# Patient Record
Sex: Female | Born: 1950
Health system: Southern US, Community
[De-identification: ages and names within clinical notes are randomized; demographics above are authoritative.]

## PROBLEM LIST (undated history)

## (undated) DIAGNOSIS — R002 Palpitations: Secondary | ICD-10-CM

## (undated) DIAGNOSIS — C4491 Basal cell carcinoma of skin, unspecified: Secondary | ICD-10-CM

## (undated) DIAGNOSIS — M1990 Unspecified osteoarthritis, unspecified site: Secondary | ICD-10-CM

## (undated) DIAGNOSIS — I480 Paroxysmal atrial fibrillation: Secondary | ICD-10-CM

## (undated) DIAGNOSIS — R112 Nausea with vomiting, unspecified: Secondary | ICD-10-CM

## (undated) DIAGNOSIS — K219 Gastro-esophageal reflux disease without esophagitis: Secondary | ICD-10-CM

## (undated) DIAGNOSIS — Z9289 Personal history of other medical treatment: Secondary | ICD-10-CM

## (undated) DIAGNOSIS — Z7901 Long term (current) use of anticoagulants: Secondary | ICD-10-CM

## (undated) DIAGNOSIS — K3580 Unspecified acute appendicitis: Secondary | ICD-10-CM

## (undated) DIAGNOSIS — G43909 Migraine, unspecified, not intractable, without status migrainosus: Secondary | ICD-10-CM

## (undated) HISTORY — DX: Palpitations: R00.2

## (undated) HISTORY — PX: TUBAL LIGATION: SHX77

## (undated) HISTORY — DX: Unspecified acute appendicitis: K35.80

## (undated) HISTORY — PX: TONSILLECTOMY: SUR1361

## (undated) HISTORY — PX: BASAL CELL CARCINOMA EXCISION: SHX1214

## (undated) HISTORY — PX: FINGER FRACTURE SURGERY: SHX638

## (undated) HISTORY — PX: APPENDECTOMY: SHX54

## (undated) HISTORY — DX: Paroxysmal atrial fibrillation: I48.0

## (undated) HISTORY — PX: JOINT REPLACEMENT: SHX530

## (undated) HISTORY — PX: ACHILLES TENDON SURGERY: SHX542

## (undated) HISTORY — DX: Long term (current) use of anticoagulants: Z79.01

---

## 1987-09-25 HISTORY — PX: HIP FRACTURE SURGERY: SHX118

## 1989-11-24 HISTORY — PX: HIP SURGERY: SHX245

## 1990-11-24 HISTORY — PX: TOTAL HIP ARTHROPLASTY: SHX124

## 1995-11-25 HISTORY — PX: REVISION TOTAL HIP ARTHROPLASTY: SHX766

## 1999-10-08 ENCOUNTER — Other Ambulatory Visit: Admission: RE | Admit: 1999-10-08 | Discharge: 1999-10-08 | Payer: Self-pay | Admitting: Obstetrics and Gynecology

## 1999-10-10 ENCOUNTER — Other Ambulatory Visit: Admission: RE | Admit: 1999-10-10 | Discharge: 1999-10-10 | Payer: Self-pay | Admitting: Obstetrics and Gynecology

## 1999-10-10 ENCOUNTER — Encounter (INDEPENDENT_AMBULATORY_CARE_PROVIDER_SITE_OTHER): Payer: Self-pay

## 2000-10-12 ENCOUNTER — Other Ambulatory Visit: Admission: RE | Admit: 2000-10-12 | Discharge: 2000-10-12 | Payer: Self-pay | Admitting: Obstetrics and Gynecology

## 2001-07-17 ENCOUNTER — Emergency Department (HOSPITAL_COMMUNITY): Admission: EM | Admit: 2001-07-17 | Discharge: 2001-07-17 | Payer: Self-pay | Admitting: Internal Medicine

## 2001-07-17 ENCOUNTER — Encounter: Payer: Self-pay | Admitting: Internal Medicine

## 2001-08-19 ENCOUNTER — Encounter: Payer: Self-pay | Admitting: Family Medicine

## 2001-08-19 ENCOUNTER — Encounter: Admission: RE | Admit: 2001-08-19 | Discharge: 2001-08-19 | Payer: Self-pay | Admitting: Family Medicine

## 2002-02-03 ENCOUNTER — Other Ambulatory Visit: Admission: RE | Admit: 2002-02-03 | Discharge: 2002-02-03 | Payer: Self-pay | Admitting: Obstetrics and Gynecology

## 2004-03-13 ENCOUNTER — Encounter: Admission: RE | Admit: 2004-03-13 | Discharge: 2004-03-13 | Payer: Self-pay | Admitting: Family Medicine

## 2007-11-29 ENCOUNTER — Ambulatory Visit: Payer: Self-pay | Admitting: Gastroenterology

## 2007-12-13 ENCOUNTER — Ambulatory Visit: Payer: Self-pay | Admitting: Gastroenterology

## 2008-02-12 ENCOUNTER — Emergency Department (HOSPITAL_COMMUNITY): Admission: EM | Admit: 2008-02-12 | Discharge: 2008-02-12 | Payer: Self-pay | Admitting: Emergency Medicine

## 2008-03-27 ENCOUNTER — Telehealth: Payer: Self-pay | Admitting: Gastroenterology

## 2008-03-29 ENCOUNTER — Ambulatory Visit: Payer: Self-pay | Admitting: Gastroenterology

## 2008-04-03 ENCOUNTER — Telehealth: Payer: Self-pay | Admitting: Gastroenterology

## 2008-04-05 ENCOUNTER — Telehealth: Payer: Self-pay | Admitting: Gastroenterology

## 2008-04-05 ENCOUNTER — Encounter: Payer: Self-pay | Admitting: Gastroenterology

## 2008-04-05 ENCOUNTER — Ambulatory Visit (HOSPITAL_COMMUNITY): Admission: RE | Admit: 2008-04-05 | Discharge: 2008-04-05 | Payer: Self-pay | Admitting: Gastroenterology

## 2008-04-07 ENCOUNTER — Ambulatory Visit: Payer: Self-pay | Admitting: Gastroenterology

## 2008-04-11 ENCOUNTER — Ambulatory Visit: Payer: Self-pay | Admitting: Gastroenterology

## 2010-04-16 ENCOUNTER — Ambulatory Visit (HOSPITAL_COMMUNITY): Admission: RE | Admit: 2010-04-16 | Discharge: 2010-04-16 | Payer: Self-pay | Admitting: Obstetrics and Gynecology

## 2010-12-24 NOTE — Progress Notes (Signed)
Summary: DIARREAH  Medications Added DULCOLAX 5 MG TBEC (BISACODYL)  REGLAN 10 MG TABS (METOCLOPRAMIDE HCL)        Phone Note Call from Patient Call back at Home Phone [phone]   Caller: Spouse DAVID Call For: DR Arlyce Dice Reason for Call: Acute Illness Summary of Call: STILL HAVING DIARREAH & QUISINESS IN STOMACH WANTS TO GET APPT ASAP Initial call taken by: Leanor Kail [facility],  Apr 03, 2008 8:14 AM  Follow-up for Phone Call        PT'S SPOUSE HAS CALLED BACK TO FOLLOW UP & 1ST CALL HE MADE.  Magdalen Spatz [facility]  Apr 03, 2008 1:02 PM  Additional Follow-up for Phone Call Additional follow up Details #1::        Pt. continues with nausea-Zofran not helping. Taking immodium for diarrhea, that is somewhat improved. Now having pain at the bottom of her left rib cage x3-4 days, intermittent, becomming more severe. 1) continue immodium for diarrhea 2) continue Zofran as needed 3) Soft,bland diet. No spicy,greasy,fried, foods. 4) tylenol/Ibuprofen as needed 5) I will call pt., if new orders, after MD reviews. DR.Sharyon Peitz--PLEASE ADVISE  (chart on your desk) Additional Follow-up by: Laureen Ochs LPN,  Apr 03, 2008 5:40 PM    Additional Follow-up for Phone Call Additional follow up Details #2::    Pt was supposed to stop her metoprolol.  Did she do it? If so and symptoms continue, schedule EGD for later this week Follow-up by: Louis Meckel MD,  Apr 03, 2008 8:35 PM  Additional Follow-up for Phone Call Additional follow up Details #3:: Details for Additional Follow-up Action Taken: Pt. stopped Metoprolol as directed, sx. continue. Scheduled for Endoscopy at [facility] 04-05-08  at 10:30am. All instructions given to pt. by phone. (Precert. sent to [facility].) Additional Follow-up by: Laureen Ochs LPN,  Apr 04, 2008 8:39 AM  New/Updated Medications: DULCOLAX 5 MG TBEC (BISACODYL)  REGLAN 10 MG TABS (METOCLOPRAMIDE HCL)

## 2010-12-24 NOTE — Assessment & Plan Note (Signed)
Medications Added ZOFRAN ODT 4 MG  TBDP (ONDANSETRON) 1 tab q8h as needed      Allergies Added: NKDA  History of Present Illness Visit Type: consult Primary MD: Juluis Rainier Chief Complaint: nausea History of Present Illness:   Mrs. Circle is a 60 year old white female complaining of nausea and diarrhea. Approximately 2 days after starting propranolol for palpitations and she developed severe nausea and diarrhea. She may have 4-5 loose stools a day. She denies fever myalgias or arthralgias. Propranolol was discontinued and she was started on metoprolol. Symptoms have continued to the present. She has lost 7 pounds over the past 12 days. She took Cipro for 3 days without improvement.        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 She drinks rarely    Review of Systems       Review of systems as per history of present illness. In addition, she complains of anxiety. Review of systems is otherwise negative.    Physical Exam  She is a slightly ill-appearing female Pulse 76 blood pressure 108/66 weight 131  Physical Exam: General:   WDWN HEENT:   anicteric.  No pharyngeal abnormalities Neck:   No masses, thyroidmegaly Nodes:   No cervical, axillary, inguinal adenopathy Chest:    Clear to auscultation Cardiac:   No murmurs, gallops, rubs Abdomen:   BS active.  No abd masses, tenderness, organomegably Rectal:   Deferred Extremities:   No cyanosis, clubbing, edema Skeletal:   No deformities  Neuro:   Alert, oriented x3.  No focal abnormalities      Impression & Recommendations:  Problem # 1:  DIARRHEA (ICD-787.91) Patient's symptoms are most likely  medicine-related. Symptoms clearly are temporally related to the use of beta blocker. Nausea with it is an unusual side effect of beta blockers though diarrhea is clearly described. I think it is much less likely that she has an enteric infection  Recommendations #1 hold metoprolol #2 Zofran p.r.n. #3 check stool studies which are in progress  Problem # 2:  NAUSEA ALONE (ICD-787.02) As per #1  Medications Added to Medication List This Visit: 1)  Zofran Odt 4 Mg Tbdp (Ondansetron) .Marland Kitchen.. 1 tab q8h as needed     Prescriptions: ZOFRAN ODT 4 MG  TBDP (ONDANSETRON) 1 tab q8h as needed  #20 x 1   Entered and Authorized by:   Louis Meckel MD   Signed by:   Louis Meckel MD on 03/29/2008   Method used:   Electronically sent to ...       [facility] [address]  [id number]       [address]       [facility]  [place], [place]  [address]       Ph: [phone] or [phone]       Fax: [phone]   RxID:   [id number]  ]

## 2010-12-24 NOTE — Procedures (Signed)
Summary: EGD   EGD  Procedure date:  04/05/2008  Findings:      Findings: Normal  Location: [facility]   Patient Name: Christina [name], Christina [name] MRN: [id number] Procedure Procedures: Panendoscopy (EGD) CPT: 43235.  Personnel: Endoscopist: Barbette Hair. Arlyce Dice, MD.  Indications Symptoms: Nausea. Diarrhea. Abdominal pain,  History  Current Medications: Patient, Patient is not currently taking Coumadin.  Comments: Patient history reviewed and updated, pre-procedure physical performed prior to initiation of sedation?yes Pre-Exam Physical: Performed Apr 05, 2008  Cardio-pulmonary exam, Cardio-pulmonary exam, HEENT exam, HEENT exam, Abdominal exam, Abdominal exam, Mental status exam WNL.  Comments: Patient history reviewed and updated, pre-procedure physical performed prior to initiation of sedation? Exam Exam Info: Maximum depth of insertion Duodenum, intended Duodenum. Vocal cords, Vocal cords visualized. Gastric retroflexion, Gastric retroflexion performed. ASA Classification: II. Tolerance: good.  Sedation Meds: Patient assessed and found to be appropriate for moderate (conscious) sedation. Robinul 0.2 given IV. Fentanyl 50 mcg. given IV. Versed 4 mg. given IV. Cetacaine Spray 2 sprays given aerosolized. Robinul 0. 2 given IV. Mylicon 1 cc  Monitoring: BP and pulse monitoring, BP and pulse monitoring done. Oximetry used., Oximetry used. Supplemental O2 given Supplemental O2 given at 2 Liters. at 2 Liters.  Findings - Normal: Proximal Esophagus to Duodenal 2nd Portion.   Assessment Normal examination.  Events  Unplanned Intervention: No unplanned interventions were required.  Unplanned Events: , There were no complications. Plans Medication(s): Other: florastor 1 QD, starting Apr 05, 2008   Patient Education: Patient given standard instructions for: a normal exam.  Disposition: After procedure patient sent to recovery. After recovery patient sent home.   Scheduling: Stool for C dificile toxin, around  Office Visit, to [employer]. Arlyce Dice, MD, around Apr 12, 2008.      Prescriptions: HYOSCYAMINE SULFATE CR 0.375 MG  TB12 (HYOSCYAMINE SULFATE) 1 tab two times a day for 5 days, then as needed  #25 x 2   Entered and Authorized by:   Louis Meckel MD   Signed by:   Louis Meckel MD on 04/05/2008   Method used:   Electronically [facility] to ...       [facility]  [address]  [id number]       [address]       [place], [place]  [address]       Ph: [phone] or [phone]       Fax: [phone]   RxID:   [id number]

## 2010-12-24 NOTE — Miscellaneous (Signed)
Summary: PHARMACY/ALLERGY  Clinical Lists Changes  Observations: Added new observation of NKA: T (03/29/2008 8:41)

## 2011-04-24 ENCOUNTER — Other Ambulatory Visit: Payer: Self-pay | Admitting: Obstetrics and Gynecology

## 2011-04-24 DIAGNOSIS — R928 Other abnormal and inconclusive findings on diagnostic imaging of breast: Secondary | ICD-10-CM

## 2011-04-30 ENCOUNTER — Ambulatory Visit
Admission: RE | Admit: 2011-04-30 | Discharge: 2011-04-30 | Disposition: A | Discharge status: Home / Self Care | Payer: 59 | Source: Ambulatory Visit | Attending: Obstetrics and Gynecology | Admitting: Obstetrics and Gynecology

## 2011-04-30 DIAGNOSIS — R928 Other abnormal and inconclusive findings on diagnostic imaging of breast: Secondary | ICD-10-CM

## 2011-08-18 LAB — DIFFERENTIAL
Basophils Absolute: 0.1
Basophils Relative: 1
Eosinophils Absolute: 0.1
Eosinophils Relative: 1
Lymphocytes Relative: 25
Lymphs Abs: 1.9
Monocytes Absolute: 0.4
Monocytes Relative: 6
Neutro Abs: 5.2
Neutrophils Relative %: 68

## 2011-08-18 LAB — POCT CARDIAC MARKERS
CKMB, poc: <1 — ABNORMAL LOW
Myoglobin, poc: 32.9
Operator id: 1211
Troponin i, poc: <0.05

## 2011-08-18 LAB — CBC
HCT: 38.2
Hemoglobin: 12.8
MCHC: 33.5
MCV: 89.5
Platelets: 300
RBC: 4.27
RDW: 12.9
WBC: 7.7

## 2011-08-18 LAB — URINALYSIS, ROUTINE W REFLEX MICROSCOPIC
Bilirubin Urine: NEGATIVE
Glucose, UA: NEGATIVE
Hgb urine dipstick: NEGATIVE
Ketones, ur: NEGATIVE
Nitrite: NEGATIVE
Protein, ur: NEGATIVE
Specific Gravity, Urine: 1.017
Urobilinogen, UA: 0.2
pH: 7.5

## 2011-08-18 LAB — COMPREHENSIVE METABOLIC PANEL
ALT: 20
AST: 20
Albumin: 3.8
Alkaline Phosphatase: 56
BUN: 14
CO2: 28
Calcium: 9.2
Chloride: 107
Creatinine, Ser: 0.86
GFR calc Af Amer: >60
GFR calc non Af Amer: >60
Glucose, Bld: 118 — ABNORMAL HIGH
Potassium: 4.6
Sodium: 142
Total Bilirubin: 0.6
Total Protein: 7.3

## 2011-08-20 LAB — CBC
HCT: 37.9
Hemoglobin: 12.9
MCHC: 34.2
MCV: 89.6
Platelets: 202
RBC: 4.23
RDW: 12.1
WBC: 5.9

## 2011-08-20 LAB — COMPREHENSIVE METABOLIC PANEL
ALT: 15
AST: 16
Albumin: 3.5
Alkaline Phosphatase: 45
BUN: 6
CO2: 29
Calcium: 9
Chloride: 110
Creatinine, Ser: 0.94
GFR calc Af Amer: >60
GFR calc non Af Amer: >60
Glucose, Bld: 97
Potassium: 4.1
Sodium: 145
Total Bilirubin: 0.8
Total Protein: 6.2

## 2011-08-20 LAB — DIFFERENTIAL
Basophils Absolute: 0.1
Basophils Relative: 2 — ABNORMAL HIGH
Eosinophils Absolute: 0.1
Eosinophils Relative: 1
Lymphocytes Relative: 28
Lymphs Abs: 1.7
Monocytes Absolute: 0.5
Monocytes Relative: 8
Neutro Abs: 3.5
Neutrophils Relative %: 61

## 2011-11-21 ENCOUNTER — Ambulatory Visit (INDEPENDENT_AMBULATORY_CARE_PROVIDER_SITE_OTHER): Payer: 59

## 2011-11-21 DIAGNOSIS — M79609 Pain in unspecified limb: Secondary | ICD-10-CM

## 2011-11-21 DIAGNOSIS — S90129A Contusion of unspecified lesser toe(s) without damage to nail, initial encounter: Secondary | ICD-10-CM

## 2011-12-11 ENCOUNTER — Ambulatory Visit (INDEPENDENT_AMBULATORY_CARE_PROVIDER_SITE_OTHER): Payer: 59

## 2011-12-11 DIAGNOSIS — M79609 Pain in unspecified limb: Secondary | ICD-10-CM

## 2012-05-07 ENCOUNTER — Emergency Department (HOSPITAL_COMMUNITY): Payer: 59 | Admitting: *Deleted

## 2012-05-07 ENCOUNTER — Ambulatory Visit (INDEPENDENT_AMBULATORY_CARE_PROVIDER_SITE_OTHER): Payer: 59 | Admitting: Family Medicine

## 2012-05-07 ENCOUNTER — Inpatient Hospital Stay (HOSPITAL_COMMUNITY)
Admission: EM | Admit: 2012-05-07 | Discharge: 2012-05-08 | DRG: 343 | Disposition: A | Discharge status: Home / Self Care | Payer: 59 | Attending: Emergency Medicine | Admitting: Emergency Medicine

## 2012-05-07 ENCOUNTER — Encounter (HOSPITAL_COMMUNITY): Admission: EM | Disposition: A | Discharge status: Home / Self Care | Payer: Self-pay | Source: Home / Self Care

## 2012-05-07 ENCOUNTER — Encounter (HOSPITAL_COMMUNITY): Payer: Self-pay

## 2012-05-07 ENCOUNTER — Encounter (HOSPITAL_COMMUNITY): Payer: Self-pay | Admitting: *Deleted

## 2012-05-07 ENCOUNTER — Ambulatory Visit
Admission: RE | Admit: 2012-05-07 | Discharge: 2012-05-07 | Disposition: A | Discharge status: Home / Self Care | Payer: 59 | Source: Ambulatory Visit | Attending: Family Medicine | Admitting: Family Medicine

## 2012-05-07 VITALS — BP 137/81 | HR 95 | Temp 99.0°F | Resp 17 | Ht 62.5 in | Wt 138.0 lb

## 2012-05-07 DIAGNOSIS — R1031 Right lower quadrant pain: Secondary | ICD-10-CM

## 2012-05-07 DIAGNOSIS — R109 Unspecified abdominal pain: Secondary | ICD-10-CM

## 2012-05-07 DIAGNOSIS — K3580 Unspecified acute appendicitis: Principal | ICD-10-CM | POA: Diagnosis present

## 2012-05-07 DIAGNOSIS — K37 Unspecified appendicitis: Secondary | ICD-10-CM

## 2012-05-07 DIAGNOSIS — Z7982 Long term (current) use of aspirin: Secondary | ICD-10-CM

## 2012-05-07 DIAGNOSIS — Z79899 Other long term (current) drug therapy: Secondary | ICD-10-CM

## 2012-05-07 HISTORY — DX: Nausea with vomiting, unspecified: R11.2

## 2012-05-07 HISTORY — PX: LAPAROSCOPIC APPENDECTOMY: SHX408

## 2012-05-07 LAB — POCT URINALYSIS DIPSTICK
Bilirubin, UA: NEGATIVE
Glucose, UA: NEGATIVE
Ketones, UA: NEGATIVE
Leukocytes, UA: NEGATIVE
Nitrite, UA: NEGATIVE
Protein, UA: NEGATIVE
Spec Grav, UA: 1.01
Urobilinogen, UA: 0.2
pH, UA: 6.5

## 2012-05-07 LAB — BASIC METABOLIC PANEL
BUN: 9 mg/dL (ref 6–23)
CO2: 27 mEq/L (ref 19–32)
Calcium: 9.6 mg/dL (ref 8.4–10.5)
Chloride: 99 mEq/L (ref 96–112)
Creatinine, Ser: 0.72 mg/dL (ref 0.50–1.10)
GFR calc Af Amer: >90 mL/min (ref >90)
GFR calc non Af Amer: 89 mL/min — ABNORMAL LOW (ref >90)
Glucose, Bld: 108 mg/dL — ABNORMAL HIGH (ref 70–99)
Potassium: 3.6 mEq/L (ref 3.5–5.1)
Sodium: 138 mEq/L (ref 135–145)

## 2012-05-07 LAB — CBC
HCT: 39.9 % (ref 36.0–46.0)
Hemoglobin: 13.6 g/dL (ref 12.0–15.0)
MCH: 30.4 pg (ref 26.0–34.0)
MCHC: 34.1 g/dL (ref 30.0–36.0)
MCV: 89.1 fL (ref 78.0–100.0)
Platelets: 265 10*3/uL (ref 150–400)
RBC: 4.48 MIL/uL (ref 3.87–5.11)
RDW: 13 % (ref 11.5–15.5)
WBC: 12.8 10*3/uL — ABNORMAL HIGH (ref 4.0–10.5)

## 2012-05-07 LAB — DIFFERENTIAL
Basophils Absolute: 0 10*3/uL (ref 0.0–0.1)
Basophils Relative: 0 % (ref 0–1)
Eosinophils Absolute: 0 10*3/uL (ref 0.0–0.7)
Eosinophils Relative: 0 % (ref 0–5)
Lymphocytes Relative: 16 % (ref 12–46)
Lymphs Abs: 2 10*3/uL (ref 0.7–4.0)
Monocytes Absolute: 0.7 10*3/uL (ref 0.1–1.0)
Monocytes Relative: 5 % (ref 3–12)
Neutro Abs: 10.1 10*3/uL — ABNORMAL HIGH (ref 1.7–7.7)
Neutrophils Relative %: 79 % — ABNORMAL HIGH (ref 43–77)

## 2012-05-07 LAB — POCT UA - MICROSCOPIC ONLY
Casts, Ur, LPF, POC: NEGATIVE
Crystals, Ur, HPF, POC: NEGATIVE
Mucus, UA: POSITIVE
Yeast, UA: NEGATIVE

## 2012-05-07 LAB — TYPE AND SCREEN
ABO/RH(D): B POS
Antibody Screen: NEGATIVE

## 2012-05-07 LAB — POCT CBC
Granulocyte percent: 75.1 %G (ref 37–80)
HCT, POC: 43.9 % (ref 37.7–47.9)
Hemoglobin: 13.9 g/dL (ref 12.2–16.2)
Lymph, poc: 2.8 (ref 0.6–3.4)
MCH, POC: 28.8 pg (ref 27–31.2)
MCHC: 31.7 g/dL — AB (ref 31.8–35.4)
MCV: 90.9 fL (ref 80–97)
MID (cbc): 0.6 (ref 0–0.9)
MPV: 9.9 fL (ref 0–99.8)
POC Granulocyte: 10.5 — AB (ref 2–6.9)
POC LYMPH PERCENT: 20.3 %L (ref 10–50)
POC MID %: 4.6 %M (ref 0–12)
Platelet Count, POC: 287 10*3/uL (ref 142–424)
RBC: 4.83 M/uL (ref 4.04–5.48)
RDW, POC: 13.9 %
WBC: 14 10*3/uL — AB (ref 4.6–10.2)

## 2012-05-07 LAB — POCT URINE PREGNANCY: Preg Test, Ur: NEGATIVE

## 2012-05-07 LAB — PROTIME-INR
INR: 1.05 (ref 0.00–1.49)
Prothrombin Time: 13.9 seconds (ref 11.6–15.2)

## 2012-05-07 LAB — APTT: aPTT: 35 seconds (ref 24–37)

## 2012-05-07 LAB — ABO/RH: ABO/RH(D): B POS

## 2012-05-07 SURGERY — APPENDECTOMY, LAPAROSCOPIC
Anesthesia: General | Site: Abdomen | Wound class: Clean Contaminated

## 2012-05-07 MED ORDER — HEPARIN SODIUM (PORCINE) 5000 UNIT/ML IJ SOLN
5000.0000 [IU] | Freq: Three times a day (TID) | INTRAMUSCULAR | Status: DC
  Administered 2012-05-07 – 2012-05-08 (×2): 5000 [IU] via SUBCUTANEOUS
  Filled 2012-05-07 (×5): qty 1

## 2012-05-07 MED ORDER — ONDANSETRON HCL 4 MG/2ML IJ SOLN
INTRAMUSCULAR | Status: DC | PRN
  Administered 2012-05-07: 4 mg via INTRAVENOUS

## 2012-05-07 MED ORDER — SODIUM CHLORIDE 0.9 % IV SOLN
3.0000 g | Freq: Four times a day (QID) | INTRAVENOUS | Status: AC
  Administered 2012-05-07: 3 g via INTRAVENOUS
  Filled 2012-05-07: qty 3

## 2012-05-07 MED ORDER — GLYCOPYRROLATE 0.2 MG/ML IJ SOLN
INTRAMUSCULAR | Status: DC | PRN
  Administered 2012-05-07: 0.6 mg via INTRAVENOUS

## 2012-05-07 MED ORDER — SUCCINYLCHOLINE CHLORIDE 20 MG/ML IJ SOLN
INTRAMUSCULAR | Status: DC | PRN
  Administered 2012-05-07 (×2): 100 mg via INTRAVENOUS

## 2012-05-07 MED ORDER — HYDROCODONE-ACETAMINOPHEN 5-325 MG PO TABS: 1.0000 | ORAL_TABLET | ORAL | Status: DC | PRN

## 2012-05-07 MED ORDER — MIDAZOLAM HCL 5 MG/5ML IJ SOLN
INTRAMUSCULAR | Status: DC | PRN
  Administered 2012-05-07: 2 mg via INTRAVENOUS

## 2012-05-07 MED ORDER — BUPIVACAINE-EPINEPHRINE PF 0.25-1:200000 % IJ SOLN
INTRAMUSCULAR | Status: AC
  Filled 2012-05-07: qty 30

## 2012-05-07 MED ORDER — ACETAMINOPHEN 10 MG/ML IV SOLN
INTRAVENOUS | Status: AC
  Filled 2012-05-07: qty 100

## 2012-05-07 MED ORDER — ROCURONIUM BROMIDE 100 MG/10ML IV SOLN
INTRAVENOUS | Status: DC | PRN
  Administered 2012-05-07: 20 mg via INTRAVENOUS

## 2012-05-07 MED ORDER — PROPOFOL 10 MG/ML IV BOLUS
INTRAVENOUS | Status: DC | PRN
  Administered 2012-05-07: 40 mg via INTRAVENOUS
  Administered 2012-05-07: 160 mg via INTRAVENOUS

## 2012-05-07 MED ORDER — HYDROMORPHONE HCL PF 1 MG/ML IJ SOLN: 0.5000 mg | INTRAMUSCULAR | Status: DC | PRN

## 2012-05-07 MED ORDER — IOHEXOL 300 MG/ML  SOLN
30.0000 mL | Freq: Once | INTRAMUSCULAR | Status: AC | PRN
  Administered 2012-05-07: 30 mL via ORAL

## 2012-05-07 MED ORDER — SODIUM CHLORIDE 0.9 % IV BOLUS (SEPSIS)
1000.0000 mL | Freq: Once | INTRAVENOUS | Status: AC
  Administered 2012-05-07: 1000 mL via INTRAVENOUS

## 2012-05-07 MED ORDER — FENTANYL CITRATE 0.05 MG/ML IJ SOLN
INTRAMUSCULAR | Status: DC | PRN
  Administered 2012-05-07: 50 ug via INTRAVENOUS
  Administered 2012-05-07: 100 ug via INTRAVENOUS
  Administered 2012-05-07 (×2): 50 ug via INTRAVENOUS

## 2012-05-07 MED ORDER — ACETAMINOPHEN 10 MG/ML IV SOLN
INTRAVENOUS | Status: DC | PRN
  Administered 2012-05-07: 1000 mg via INTRAVENOUS

## 2012-05-07 MED ORDER — LACTATED RINGERS IV SOLN
INTRAVENOUS | Status: DC | PRN
  Administered 2012-05-07: 20:00:00 via INTRAVENOUS

## 2012-05-07 MED ORDER — FENTANYL CITRATE 0.05 MG/ML IJ SOLN: 25.0000 ug | INTRAMUSCULAR | Status: DC | PRN

## 2012-05-07 MED ORDER — SODIUM CHLORIDE 0.9 % IV SOLN
3.0000 g | Freq: Once | INTRAVENOUS | Status: AC
  Administered 2012-05-07: 3 g via INTRAVENOUS
  Filled 2012-05-07: qty 3

## 2012-05-07 MED ORDER — ACETAMINOPHEN 10 MG/ML IV SOLN
1000.0000 mg | Freq: Four times a day (QID) | INTRAVENOUS | Status: DC
  Administered 2012-05-08: 1000 mg via INTRAVENOUS
  Filled 2012-05-07 (×4): qty 100

## 2012-05-07 MED ORDER — LACTATED RINGERS IV SOLN
INTRAVENOUS | Status: DC | PRN
  Administered 2012-05-07: 1000 mL

## 2012-05-07 MED ORDER — DEXAMETHASONE SODIUM PHOSPHATE 10 MG/ML IJ SOLN
INTRAMUSCULAR | Status: DC | PRN
  Administered 2012-05-07: 10 mg via INTRAVENOUS

## 2012-05-07 MED ORDER — NEOSTIGMINE METHYLSULFATE 1 MG/ML IJ SOLN
INTRAMUSCULAR | Status: DC | PRN
  Administered 2012-05-07: 4 mg via INTRAVENOUS

## 2012-05-07 MED ORDER — LACTATED RINGERS IV SOLN: INTRAVENOUS | Status: DC

## 2012-05-07 MED ORDER — SUMATRIPTAN SUCCINATE 100 MG PO TABS
100.0000 mg | ORAL_TABLET | ORAL | Status: DC | PRN
  Filled 2012-05-07: qty 1

## 2012-05-07 MED ORDER — LIDOCAINE HCL (CARDIAC) 20 MG/ML IV SOLN
INTRAVENOUS | Status: DC | PRN
  Administered 2012-05-07: 80 mg via INTRAVENOUS

## 2012-05-07 MED ORDER — PROMETHAZINE HCL 25 MG/ML IJ SOLN: 6.2500 mg | INTRAMUSCULAR | Status: DC | PRN

## 2012-05-07 MED ORDER — ONDANSETRON HCL 4 MG/2ML IJ SOLN
4.0000 mg | Freq: Once | INTRAMUSCULAR | Status: AC
  Administered 2012-05-07: 4 mg via INTRAVENOUS
  Filled 2012-05-07: qty 2

## 2012-05-07 MED ORDER — KCL IN DEXTROSE-NACL 20-5-0.45 MEQ/L-%-% IV SOLN
INTRAVENOUS | Status: DC
  Administered 2012-05-07: 23:00:00 via INTRAVENOUS
  Filled 2012-05-07 (×3): qty 1000

## 2012-05-07 MED ORDER — SODIUM CHLORIDE 0.9 % IV SOLN
Freq: Once | INTRAVENOUS | Status: AC
  Administered 2012-05-07: 20:00:00 via INTRAVENOUS

## 2012-05-07 MED ORDER — IOHEXOL 300 MG/ML  SOLN
100.0000 mL | Freq: Once | INTRAMUSCULAR | Status: AC | PRN
  Administered 2012-05-07: 100 mL via INTRAVENOUS

## 2012-05-07 MED ORDER — BUPIVACAINE-EPINEPHRINE 0.25% -1:200000 IJ SOLN
INTRAMUSCULAR | Status: DC | PRN
  Administered 2012-05-07: 14 mL

## 2012-05-07 MED ORDER — GABAPENTIN 300 MG PO CAPS
300.0000 mg | ORAL_CAPSULE | Freq: Three times a day (TID) | ORAL | Status: DC
  Administered 2012-05-07: 300 mg via ORAL
  Filled 2012-05-07 (×4): qty 1

## 2012-05-07 MED ORDER — MORPHINE SULFATE 4 MG/ML IJ SOLN
4.0000 mg | Freq: Once | INTRAMUSCULAR | Status: AC
  Administered 2012-05-07: 4 mg via INTRAVENOUS
  Filled 2012-05-07: qty 1

## 2012-05-07 SURGICAL SUPPLY — 36 items
APPLIER CLIP ROT 10 11.4 M/L (STAPLE)
BENZOIN TINCTURE PRP APPL 2/3 (GAUZE/BANDAGES/DRESSINGS) ×2 IMPLANT
CANISTER SUCTION 2500CC (MISCELLANEOUS) ×2 IMPLANT
CLIP APPLIE ROT 10 11.4 M/L (STAPLE) IMPLANT
CLOTH BEACON ORANGE TIMEOUT ST (SAFETY) ×2 IMPLANT
COVER SURGICAL LIGHT HANDLE (MISCELLANEOUS) ×2 IMPLANT
CUTTER FLEX LINEAR 45M (STAPLE) ×2 IMPLANT
DECANTER SPIKE VIAL GLASS SM (MISCELLANEOUS) IMPLANT
DRAPE LAPAROSCOPIC ABDOMINAL (DRAPES) ×2 IMPLANT
ELECT REM PT RETURN 9FT ADLT (ELECTROSURGICAL) ×2
ELECTRODE REM PT RTRN 9FT ADLT (ELECTROSURGICAL) ×1 IMPLANT
ENDOLOOP SUT PDS II  0 18 (SUTURE)
ENDOLOOP SUT PDS II 0 18 (SUTURE) IMPLANT
GLOVE BIOGEL M 8.0 STRL (GLOVE) ×2 IMPLANT
GLOVE BIOGEL PI IND STRL 7.0 (GLOVE) ×1 IMPLANT
GLOVE BIOGEL PI INDICATOR 7.0 (GLOVE) ×1
GOWN STRL NON-REIN LRG LVL3 (GOWN DISPOSABLE) ×2 IMPLANT
GOWN STRL REIN XL XLG (GOWN DISPOSABLE) ×4 IMPLANT
HAND ACTIVATED (MISCELLANEOUS) ×2 IMPLANT
KIT BASIN OR (CUSTOM PROCEDURE TRAY) ×2 IMPLANT
NS IRRIG 1000ML POUR BTL (IV SOLUTION) ×2 IMPLANT
PENCIL BUTTON HOLSTER BLD 10FT (ELECTRODE) ×2 IMPLANT
POUCH SPECIMEN RETRIEVAL 10MM (ENDOMECHANICALS) ×2 IMPLANT
RELOAD 45 VASCULAR/THIN (ENDOMECHANICALS) ×2 IMPLANT
RELOAD STAPLE TA45 3.5 REG BLU (ENDOMECHANICALS) IMPLANT
SET IRRIG TUBING LAPAROSCOPIC (IRRIGATION / IRRIGATOR) ×2 IMPLANT
SOLUTION ANTI FOG 6CC (MISCELLANEOUS) ×2 IMPLANT
STRIP CLOSURE SKIN 1/2X4 (GAUZE/BANDAGES/DRESSINGS) ×2 IMPLANT
SUT VIC AB 4-0 SH 18 (SUTURE) ×2 IMPLANT
SUT VICRYL 0 UR6 27IN ABS (SUTURE) ×2 IMPLANT
SYR 30ML LL (SYRINGE) ×2 IMPLANT
TRAY FOLEY CATH 14FRSI W/METER (CATHETERS) ×2 IMPLANT
TRAY LAP CHOLE (CUSTOM PROCEDURE TRAY) ×2 IMPLANT
TROCAR XCEL BLUNT TIP 100MML (ENDOMECHANICALS) ×2 IMPLANT
TROCAR XCEL NON-BLD 11X100MML (ENDOMECHANICALS) ×2 IMPLANT
TUBING INSUFFLATION 10FT LAP (TUBING) ×2 IMPLANT

## 2012-05-07 NOTE — Anesthesia Preprocedure Evaluation (Signed)
Anesthesia Evaluation  Patient identified by MRN, date of birth, ID band Patient awake    Reviewed: Allergy & Precautions, H&P , NPO status , Patient's Chart, lab work & pertinent test results, reviewed documented beta blocker date and time   History of Anesthesia Complications (+) PONV  Airway Mallampati: II TM Distance: >3 FB Neck ROM: full    Dental No notable dental hx. (+) Teeth Intact and Dental Advidsory Given   Pulmonary neg pulmonary ROS,  breath sounds clear to auscultation  Pulmonary exam normal       Cardiovascular Exercise Tolerance: Good negative cardio ROS  Rhythm:regular Rate:Normal     Neuro/Psych negative neurological ROS  negative psych ROS   GI/Hepatic negative GI ROS, Neg liver ROS,   Endo/Other  negative endocrine ROS  Renal/GU negative Renal ROS  negative genitourinary   Musculoskeletal   Abdominal   Peds  Hematology negative hematology ROS (+)   Anesthesia Other Findings   Reproductive/Obstetrics negative OB ROS                           Anesthesia Physical Anesthesia Plan  ASA: I and Emergent  Anesthesia Plan: General, General ETT and Rapid Sequence   Post-op Pain Management:    Induction:   Airway Management Planned:   Additional Equipment:   Intra-op Plan:   Post-operative Plan:   Informed Consent: I have reviewed the patients History and Physical, chart, labs and discussed the procedure including the risks, benefits and alternatives for the proposed anesthesia with the patient or authorized representative who has indicated his/her understanding and acceptance.   Dental Advisory Given  Plan Discussed with: CRNA  Anesthesia Plan Comments:         Anesthesia Quick Evaluation

## 2012-05-07 NOTE — Progress Notes (Signed)
Patient Name: Christina [name] Date of Birth: May 12, 1951 Medical Record Number: [id number] Gender: female Date of Encounter: 05/07/2012  History of Present Illness:  Christina [name] is a 61 y.o. very pleasant female patient who presents with the following:  Last night around 9pm she noted lower abdominal pain which settled into her RLQ. She has noted anorexia (she did eat a few bites this morning) and nausea, but no vomiting.  She had not noted a fever, but did have chills last night.    No urinary symptoms such as dysuria or hematuria.   She has noted loose stools but no diarrhea.    She had had a BTL and surgery on her hips for fractures- otherwise none.    She is generally quite healthy.   Last colonoscopy 3 or 4 years ago- ok per her report.    Patient Active Problem List  Diagnosis  . NAUSEA ALONE  . DIARRHEA   No past medical history on file. No past surgical history on file. History  Substance Use Topics  . Smoking status: Never Smoker   . Smokeless tobacco: Not on file  . Alcohol Use: Not on file   No family history on file. No Known Allergies  Medication list has been reviewed and updated.  Prior to Admission medications   Not on File    Review of Systems:  As per HPI- otherwise negative.   Physical Examination: Filed Vitals:   05/07/12 1103  BP: 137/81  Pulse: 95  Temp: 99 F (37.2 C)  Resp: 17   Filed Vitals:   05/07/12 1103  Height: 5' 2.5" (1.588 m)  Weight: 138 lb (62.596 kg)   Body mass index is 24.84 kg/(m^2). Ideal Body Weight: Weight in (lb) to have BMI = 25: 138.6   GEN: WDWN, NAD, Non-toxic, A & O x 3 HEENT: Atraumatic, Normocephalic. Neck supple. No masses, No LAD.  TM wnl, PEERL, oropharynx wnl Ears and Nose: No external deformity. CV: RRR, No M/G/R. No JVD. No thrill. No extra heart sounds. PULM: CTA B, no wheezes, crackles, rhonchi. No retractions. No resp. distress. No accessory muscle use. ABD: S, ND.  Normal BS.  No  HSM.  She has RLQ tenderness, positive pain with rebound and some guarding GU: normal externally and internally, no CMT.  Tenderness right adnexa.   EXTR: No c/c/e NEURO Normal gait.  PSYCH: Normally interactive. Conversant. Not depressed or anxious appearing.  Calm demeanor.   Results for orders placed in visit on 05/07/12  POCT CBC      Component Value Range   WBC 14.0 (*) 4.6 - 10.2 K/uL   Lymph, poc 2.8  0.6 - 3.4   POC LYMPH PERCENT 20.3  10 - 50 %L   MID (cbc) 0.6  0 - 0.9   POC MID % 4.6  0 - 12 %M   POC Granulocyte 10.5 (*) 2 - 6.9   Granulocyte percent 75.1  37 - 80 %G   RBC 4.83  4.04 - 5.48 M/uL   Hemoglobin 13.9  12.2 - 16.2 g/dL   HCT, POC 40.9  81.1 - 47.9 %   MCV 90.9  80 - 97 fL   MCH, POC 28.8  27 - 31.2 pg   MCHC 31.7 (*) 31.8 - 35.4 g/dL   RDW, POC 91.4     Platelet Count, POC 287  142 - 424 K/uL   MPV 9.9  0 - 99.8 fL  POCT URINALYSIS DIPSTICK  Component Value Range   Color, UA yellow     Clarity, UA clear     Glucose, UA neg     Bilirubin, UA neg     Ketones, UA neg     Spec Grav, UA 1.010     Blood, UA small     pH, UA 6.5     Protein, UA neg     Urobilinogen, UA 0.2     Nitrite, UA neg     Leukocytes, UA Negative    POCT UA - MICROSCOPIC ONLY      Component Value Range   WBC, Ur, HPF, POC 2-3     RBC, urine, microscopic 4-5     Bacteria, U Microscopic 2+     Mucus, UA pos     Epithelial cells, urine per micros 1-3     Crystals, Ur, HPF, POC neg     Casts, Ur, LPF, POC neg     Yeast, UA neg    POCT URINE PREGNANCY      Component Value Range   Preg Test, Ur Negative       Assessment and Plan: 1. Abdominal  pain, other specified site  POCT CBC, POCT urinalysis dipstick, POCT UA - Microscopic Only, POCT urine pregnancy, CT Abdomen Pelvis W Contrast, Urine culture   Suspect appendicitis.  Will proceed to [facility] for a CT abdomen and pelvis with contrast.    CT result did show appendicitis but no evidence of rupture.  Will  have her drive to [facility] with her husband.  Called and alerted [facility] ED staff. Appreciate care by ED and surgical staff of this very nice patient.   Abbe Amsterdam, MD

## 2012-05-07 NOTE — Transfer of Care (Signed)
Immediate Anesthesia Transfer of Care Note  Patient: Christina [name]  Procedure(s) Performed: Procedure(s) (LRB): APPENDECTOMY LAPAROSCOPIC (N/A)  Patient Location: PACU  Anesthesia Type: General  Level of Consciousness: awake, alert  and oriented  Airway & Oxygen Therapy: Patient Spontanous Breathing and Patient connected to face mask oxygen  Post-op Assessment: Report given to PACU RN and Post -op Vital signs reviewed and stable  Post vital signs: Reviewed and stable  Complications: No apparent anesthesia complications

## 2012-05-07 NOTE — ED Notes (Signed)
Patient reports that she was told by Dr. Dallas Schimke that she had appendicitis per the CT Scan. Patient was told to come to the ED.  Patient c/o nausea and right lower quadrant pain.

## 2012-05-07 NOTE — H&P (Signed)
Chief Complaint:  Right lower quadrant abdominal pain since last night  History of Present Illness:  Christina [name] is an 61 y.o.  white female who began having lower abdominal pain last night and was seen and referred by urgent care to the ER for a CT scan. CT scan showed findings consistent with appendicitis. Patient has point tenderness the right lower quadrant.  We discussed laparoscopic appendectomy in some detail. She is aware of the indications and risks. She's been otherwise healthy except for a right hip surgery. She's never had any abdominal surgery.  Past Medical History  Diagnosis Date  . PONV (postoperative nausea and vomiting)   . Dysrhythmia     saw Dr. Donnie Aho - no tx needed    Past Surgical History  Procedure Date  . Hip surgery     4 right hip surgeries  . Tubal ligation   . Achilles tendon surgery     left  . Finger surgery     left middle finger - pin placed    Current Facility-Administered Medications  Medication Dose Route Frequency Provider Last Rate Last Dose  . 0.9 %  sodium chloride infusion   Intravenous Once Nat Christen, MD      . Ampicillin-Sulbactam (UNASYN) 3 g in sodium chloride 0.9 % 100 mL IVPB  3 g Intravenous Once Nat Christen, MD   3 g at 05/07/12 1725  . morphine 4 MG/ML injection 4 mg  4 mg Intravenous Once Nat Christen, MD   4 mg at 05/07/12 1725  . ondansetron (ZOFRAN) injection 4 mg  4 mg Intravenous Once Nat Christen, MD   4 mg at 05/07/12 1725  . sodium chloride 0.9 % bolus 1,000 mL  1,000 mL Intravenous Once Nat Christen, MD   1,000 mL at 05/07/12 1725   Current Outpatient Prescriptions  Medication Sig Dispense Refill  . aspirin 81 MG chewable tablet Chew 81 mg by mouth daily.      Marland Kitchen gabapentin (NEURONTIN) 300 MG capsule Take 300 mg by mouth 3 (three) times daily.      . multivitamin-lutein (OCUVITE-LUTEIN) CAPS Take 1 capsule by mouth daily.      . SUMAtriptan (IMITREX) 100 MG tablet Take 100 mg by mouth  every 2 (two) hours as needed.      . vitamin C (ASCORBIC ACID) 500 MG tablet Take 500 mg by mouth daily.       Facility-Administered Medications Ordered in Other Encounters  Medication Dose Route Frequency Provider Last Rate Last Dose  . iohexol (OMNIPAQUE) 300 MG/ML solution 100 mL  100 mL Intravenous Once PRN Medication Radiologist, MD   100 mL at 05/07/12 1706  . iohexol (OMNIPAQUE) 300 MG/ML solution 30 mL  30 mL Oral Once PRN Medication Radiologist, MD   30 mL at 05/07/12 1706   Oxycontin Family History  Problem Relation Age of Onset  . Hypertension Mother   . Hypertension Sister   . Diabetes Sister    Social History:   reports that she has never smoked. She has never used smokeless tobacco. She reports that she drinks alcohol. She reports that she does not use illicit drugs.   REVIEW OF SYSTEMS - PERTINENT POSITIVES ONLY: Noncontributory  Physical Exam:   Blood pressure 123/62, pulse 106, temperature 99.1 F (37.3 C), temperature source Oral, resp. rate 21, SpO2 97.00%. There is no height or weight on file to calculate BMI.  Gen:  WDWN WHITE female NAD  Neurological: Alert and oriented  to person, place, and time. Motor and sensory function is grossly intact  Head: Normocephalic and atraumatic.  Eyes: Conjunctivae are normal. Pupils are equal, round, and reactive to light. No scleral icterus.  Neck: Normal range of motion. Neck supple. No tracheal deviation or thyromegaly present.  Cardiovascular:  SR without murmurs or gallops.  No carotid bruits Respiratory: Effort normal.  No respiratory distress. No chest wall tenderness. Breath sounds normal.  No wheezes, rales or rhonchi.  Abdomen:  Tender in right lower quadrant at McBurney's point. GU: Musculoskeletal: Normal range of motion. Extremities are nontender. No cyanosis, edema or clubbing noted Lymphadenopathy: No cervical, preauricular, postauricular or axillary adenopathy is present Skin: Skin is warm and dry. No rash  noted. No diaphoresis. No erythema. No pallor. Pscyh: Normal mood and affect. Behavior is normal. Judgment and thought content normal.   LABORATORY RESULTS: Results for orders placed during the hospital encounter of 05/07/12 (from the past 48 hour(s))  CBC     Status: Abnormal   Collection Time   05/07/12  5:40 PM      Component Value Range Comment   WBC 12.8 (*) 4.0 - 10.5 K/uL    RBC 4.48  3.87 - 5.11 MIL/uL    Hemoglobin 13.6  12.0 - 15.0 g/dL    HCT 16.1  09.6 - 04.5 %    MCV 89.1  78.0 - 100.0 fL    MCH 30.4  26.0 - 34.0 pg    MCHC 34.1  30.0 - 36.0 g/dL    RDW 40.9  81.1 - 91.4 %    Platelets 265  150 - 400 K/uL   DIFFERENTIAL     Status: Abnormal   Collection Time   05/07/12  5:40 PM      Component Value Range Comment   Neutrophils Relative 79 (*) 43 - 77 %    Neutro Abs 10.1 (*) 1.7 - 7.7 K/uL    Lymphocytes Relative 16  12 - 46 %    Lymphs Abs 2.0  0.7 - 4.0 K/uL    Monocytes Relative 5  3 - 12 %    Monocytes Absolute 0.7  0.1 - 1.0 K/uL    Eosinophils Relative 0  0 - 5 %    Eosinophils Absolute 0.0  0.0 - 0.7 K/uL    Basophils Relative 0  0 - 1 %    Basophils Absolute 0.0  0.0 - 0.1 K/uL   BASIC METABOLIC PANEL     Status: Abnormal   Collection Time   05/07/12  5:40 PM      Component Value Range Comment   Sodium 138  135 - 145 mEq/L    Potassium 3.6  3.5 - 5.1 mEq/L    Chloride 99  96 - 112 mEq/L    CO2 27  19 - 32 mEq/L    Glucose, Bld 108 (*) 70 - 99 mg/dL    BUN 9  6 - 23 mg/dL    Creatinine, Ser 7.82  0.50 - 1.10 mg/dL    Calcium 9.6  8.4 - 95.6 mg/dL    GFR calc non Af Amer 89 (*) >90 mL/min    GFR calc Af Amer >90  >90 mL/min   APTT     Status: Normal   Collection Time   05/07/12  5:40 PM      Component Value Range Comment   aPTT 35  24 - 37 seconds   PROTIME-INR     Status: Normal   Collection Time  05/07/12  5:40 PM      Component Value Range Comment   Prothrombin Time 13.9  11.6 - 15.2 seconds    INR 1.05  0.00 - 1.49   TYPE AND SCREEN      Status: Normal (Preliminary result)   Collection Time   05/07/12  5:40 PM      Component Value Range Comment   ABO/RH(D) B POS      Antibody Screen PENDING      Sample Expiration 05/10/2012       RADIOLOGY RESULTS: Ct Abdomen Pelvis W Contrast  05/07/2012  *RADIOLOGY REPORT*  Clinical Data: Right lower quadrant pain  CT ABDOMEN AND PELVIS WITH CONTRAST  Technique:  Multidetector CT imaging of the abdomen and pelvis was performed following the standard protocol during bolus administration of intravenous contrast.  Contrast:  100 ml Omnipaque-300  Comparison: None.  Findings: Lung bases are clear except for linear scarring.  The liver has a normal appearance without focal lesions or biliary ductal dilatation.  No calcified gallstones.  The spleen is normal. The pancreas is normal.  The adrenal glands are normal.  The kidneys are normal.  The aorta shows mild atherosclerotic change but no aneurysm.  No retroperitoneal mass or adenopathy.  There is acute appendicitis with a dilated appendix and periappendiceal stranding.  No evidence of rupture or abscess.  There is a leiomyoma of the uterus.  No adnexal lesion.  No free fluid in the pelvis.  Previous hip replacement on the right.  IMPRESSION: Acute appendicitis.  Periappendiceal stranding but no evidence of gross rupture or abscess.  Original Report Authenticated By: Thomasenia Sales, M.D.    Problem List: Patient Active Problem List  Diagnosis  . NAUSEA ALONE  . DIARRHEA    Assessment & Plan: CT scan evidence of appendicitis with an elevated white count. Plan laparoscopic appendectomy.    Matt B. Daphine Deutscher, MD, [facility] [phone] beeper [phone]  05/07/2012 6:57 PM

## 2012-05-07 NOTE — Anesthesia Postprocedure Evaluation (Signed)
  Anesthesia Post-op Note  Patient: Christina [name]  Procedure(s) Performed: Procedure(s) (LRB): APPENDECTOMY LAPAROSCOPIC (N/A)  Patient Location: PACU  Anesthesia Type: General  Level of Consciousness: awake, alert  and oriented  Airway and Oxygen Therapy: Patient Spontanous Breathing  Post-op Pain: none  Post-op Assessment: Post-op Vital signs reviewed, Patient's Cardiovascular Status Stable, Respiratory Function Stable, No signs of Nausea or vomiting and Pain level controlled  Post-op Vital Signs: Reviewed and stable  Complications: No apparent anesthesia complications

## 2012-05-07 NOTE — ED Provider Notes (Addendum)
History     CSN: [id number]  Arrival date & time 05/07/12  1607   First MD Initiated Contact with Patient 05/07/12 1629      Chief Complaint  Patient presents with  . Abdominal Pain    appendicitis per Dr. Dallas Schimke    (Consider location/radiation/quality/duration/timing/severity/associated sxs/prior treatment) HPI Comments: Patient presents today for right lower quadrant abdominal pain with associated nausea.  Patient states that the symptoms began last night with right lower quadrant pain and nausea without vomiting.  She's had some decreased appetite today.  She did have one pancake this morning but has not eaten since.  She went to see her primary care physician who ordered a CT scan which demonstrates appendicitis.  Patient has been referred here to the emergency department to be evaluated by surgery for admission.  Patient denies fevers at home but states at the urgent care she had a low-grade temp to 99.  She had a slightly loose stool today as well.  No hematuria or dysuria.   no prior abdominal surgeries.  Patient is a 61 y.o. female presenting with abdominal pain. The history is provided by the patient.  Abdominal Pain The primary symptoms of the illness include abdominal pain and nausea. The primary symptoms of the illness do not include fever, shortness of breath, vomiting, diarrhea or dysuria.  Symptoms associated with the illness do not include chills or back pain.    History reviewed. No pertinent past medical history.  Past Surgical History  Procedure Date  . Hip surgery     Family History  Problem Relation Age of Onset  . Hypertension Mother   . Hypertension Sister   . Diabetes Sister     History  Substance Use Topics  . Smoking status: Never Smoker   . Smokeless tobacco: Not on file  . Alcohol Use: Yes     socially    OB History    Grav Para Term Preterm Abortions TAB SAB Ect Mult Living                  Review of Systems  Constitutional:  Negative.  Negative for fever and chills.  HENT: Negative.   Eyes: Negative.  Negative for discharge and redness.  Respiratory: Negative.  Negative for cough and shortness of breath.   Cardiovascular: Negative.  Negative for chest pain.  Gastrointestinal: Positive for nausea and abdominal pain. Negative for vomiting and diarrhea.  Genitourinary: Negative.  Negative for dysuria.  Musculoskeletal: Negative.  Negative for back pain.  Skin: Negative.  Negative for color change and rash.  Neurological: Negative.  Negative for syncope and headaches.  Hematological: Negative.  Negative for adenopathy.  Psychiatric/Behavioral: Negative.  Negative for confusion.  All other systems reviewed and are negative.    Allergies  Review of patient's allergies indicates no known allergies.  Home Medications   Current Outpatient Rx  Name Route Sig Dispense Refill  . ASPIRIN 81 MG PO CHEW Oral Chew 81 mg by mouth daily.    Marland Kitchen GABAPENTIN 300 MG PO CAPS Oral Take 300 mg by mouth 3 (three) times daily.    . OCUVITE-LUTEIN PO CAPS Oral Take 1 capsule by mouth daily.    . SUMATRIPTAN SUCCINATE 100 MG PO TABS Oral Take 100 mg by mouth every 2 (two) hours as needed.    Marland Kitchen VITAMIN C 500 MG PO TABS Oral Take 500 mg by mouth daily.      BP 142/74  Pulse 130  Temp 99.1 F (37.3  C) (Oral)  Resp 18  SpO2 99%  Physical Exam  Nursing note and vitals reviewed. Constitutional: She is oriented to person, place, and time. She appears well-developed and well-nourished.  Non-toxic appearance. She does not have a sickly appearance.  HENT:  Head: Normocephalic and atraumatic.  Eyes: Conjunctivae, EOM and lids are normal. Pupils are equal, round, and reactive to light. No scleral icterus.  Neck: Trachea normal and normal range of motion. Neck supple.  Cardiovascular: Regular rhythm, S1 normal, S2 normal and normal heart sounds.  Tachycardia present.   Pulmonary/Chest: Effort normal and breath sounds normal. No  respiratory distress. She has no wheezes. She has no rales.  Abdominal: Soft. Normal appearance. There is tenderness. There is no rebound, no guarding and no CVA tenderness.       Mild tenderness to right lower quadrant.  No rebound or guarding.  Musculoskeletal: Normal range of motion.  Neurological: She is alert and oriented to person, place, and time. She has normal strength.  Skin: Skin is warm, dry and intact. No rash noted.  Psychiatric: She has a normal mood and affect. Her behavior is normal. Judgment and thought content normal.    ED Course  Procedures (including critical care time)   Labs Reviewed  CBC  DIFFERENTIAL  BASIC METABOLIC PANEL  APTT  PROTIME-INR  TYPE AND SCREEN   Ct Abdomen Pelvis W Contrast  05/07/2012  *RADIOLOGY REPORT*  Clinical Data: Right lower quadrant pain  CT ABDOMEN AND PELVIS WITH CONTRAST  Technique:  Multidetector CT imaging of the abdomen and pelvis was performed following the standard protocol during bolus administration of intravenous contrast.  Contrast:  100 ml Omnipaque-300  Comparison: None.  Findings: Lung bases are clear except for linear scarring.  The liver has a normal appearance without focal lesions or biliary ductal dilatation.  No calcified gallstones.  The spleen is normal. The pancreas is normal.  The adrenal glands are normal.  The kidneys are normal.  The aorta shows mild atherosclerotic change but no aneurysm.  No retroperitoneal mass or adenopathy.  There is acute appendicitis with a dilated appendix and periappendiceal stranding.  No evidence of rupture or abscess.  There is a leiomyoma of the uterus.  No adnexal lesion.  No free fluid in the pelvis.  Previous hip replacement on the right.  IMPRESSION: Acute appendicitis.  Periappendiceal stranding but no evidence of gross rupture or abscess.  Original Report Authenticated By: Thomasenia Sales, M.D.     No diagnosis found.   Date: 05/07/2012  Rate: 110  Rhythm: sinus tachycardia   QRS Axis: normal  Intervals: normal  ST/T Wave abnormalities: nonspecific ST/T changes  Conduction Disutrbances:none  Narrative Interpretation:   Old EKG Reviewed: unchanged from 02-12-08     MDM  Patient has been sent from her primary care doctor after she had a CT scan that found appendicitis.  At this point in time we'll keep the patient n.p.o. and begin her fluid resuscitation noting her elevated heart rate to the 130s.  She will receive pain and nausea medications.  I will contact [facility] for admission of this patient.     Nat Christen, MD 05/07/12 1645  Discussed with Dr. Gerrit Friends for evaluation.  Will start unasyn.   Nat Christen, MD 05/07/12 [phone]

## 2012-05-07 NOTE — Op Note (Signed)
Surgeon: Wenda Low, MD, FACS  Asst:  None  Anes:  General endotracheal  Procedure: Laparoscopic appendectomy  Diagnosis: Acute separative appendicitis  Complications: None  EBL:   5 cc cc  Description of Procedure:  Patient was taken to room 6 on Friday evening, 05/07/2012. Preoperatively she received Unasyn. Abdomen was prepped with PCMX and draped sterilely and a Foley was inserted. Access to the abdomen was achieved to the umbilicus using Hassan technique cut down without difficulty. The abdomen was insufflated and a 5 mm was placed the right upper quadrant and a 10/11 was placed in the left lower quadrant. The left lower quadrant was obliquely placed. The appendix was suppurative and twisted and stuck a bit to the peritoneum a. I mobilize the tip and used a harmonic to dissected the base and shaft out of the retroperitoneum. I was able to then elevated and free up the base by gradually working the way to the mesentery taking small bites and controlling hemorrhage with the harmonic scalpel. The base was isolated and transected with a single application of the Endo GIA with a white cartridge. The stump looked viable and looked good. The appendix was placed in a bag and brought to the umbilicus without difficulty. The wounds were injected with Marcaine with epinephrine. The umbilical defect was repaired using the angle scope viewing from the left lower quadrant and with 2 sutures of 0 Vicryl. Having looked to be in order after that maneuver. Scope was withdrawn and was deflated after surveying the abdomen was again. The wounds were closed 4-0 Vicryl and with Dermabond. Patient our the procedure well taken recovery room in satisfactory condition.  Matt B. Daphine Deutscher, MD, [facility], [place] [phone]

## 2012-05-08 LAB — CBC
HCT: 36.6 % (ref 36.0–46.0)
Hemoglobin: 12.2 g/dL (ref 12.0–15.0)
MCH: 30 pg (ref 26.0–34.0)
MCHC: 33.3 g/dL (ref 30.0–36.0)
MCV: 90.1 fL (ref 78.0–100.0)
Platelets: 233 10*3/uL (ref 150–400)
RBC: 4.06 MIL/uL (ref 3.87–5.11)
RDW: 13.4 % (ref 11.5–15.5)
WBC: 10.4 10*3/uL (ref 4.0–10.5)

## 2012-05-08 NOTE — Discharge Instructions (Signed)
Laparoscopic Appendectomy Appendectomy is surgery to remove the appendix. Laparoscopic surgery uses several small cuts (incisions) instead of one large incision. Laparoscopic surgery offers a shorter recovery time and less discomfort. LET YOUR CAREGIVER KNOW ABOUT:  Allergies to food or medicine.   Medicines taken, including vitamins, dietary supplements, herbs, eyedrops, over-the-counter medicines, and creams.   Use of steroids (by mouth or creams).   Previous problems with anesthetics or numbing medicines.   History of bleeding problems or blood clots.   Previous surgery.   Other health problems, including diabetes, heart problems, lung problems, and kidney problems.   Possibility of pregnancy, if this applies.  RISKS AND COMPLICATIONS  Infection. A germ starts growing in the wound. This can usually be treated with antibiotics. In some cases, the wound will need to be opened and cleaned.   Bleeding.   Damage to other organs.   Sores (abscesses).   Chronic pain at the incision sites. This is defined as pain that lasts for more than 3 months.   Blood clots in the legs that may rarely travel to the lungs.   Infection in the lungs (pneumonia).  BEFORE THE PROCEDURE Appendectomy is usually performed immediately after an inflamed appendix (appendicitis) is diagnosed. No preparation is necessary ahead of this procedure. PROCEDURE  You will be given medicine that makes you sleep (general anesthetic). After you are asleep, a flexible tube (catheter) may be inserted into your bladder to drain your urine during surgery. The tube is removed before you wake up after surgery. When you are asleep, carbondioxide gas will be used to inflate your abdomen. This will allow your surgeon to see inside your abdomen and perform your surgery. Three small incisions will be made in your abdomen. Your surgeon will insert a thin, lighted tube (laparoscope) through one of the incisions. Your surgeon will  look through the laparoscope while performing the surgery. Other tools will be inserted through the other incisions. Laparoscopic procedures may not be appropriate when:  There is major scarring from a previous surgery.   The patient has bleeding disorders.   A pregnancy is near term.   There are other conditions which make the laparoscopic procedure impossible, such as an advanced infection or a ruptured appendix.  If your surgeon feels it is not safe to continue with the laparoscopic procedure, he or she will perform an open surgery instead. This gives the surgeon a larger view and more space to work. Open surgery requires a longer recovery time. After your appendix is removed, your incisions will be closed with stitches (sutures) or skin adhesive. AFTER THE PROCEDURE You will be taken to a recovery room. When the anesthesia has worn off, you will be returned to your hospital room. You will be given pain medicines to keep you comfortable. Ask your caregiver how long your hospital stay will be. Document Released: 06/24/2004 Document Revised: 10/30/2011 Document Reviewed: 05/20/2011 Exit[employer] Patient Information 2012 [employer]. 

## 2012-05-08 NOTE — Discharge Summary (Signed)
Physician Discharge Summary  Patient ID: Christina [name] MRN: [id number] DOB/AGE: 07-17-1951 61 y.o.  Admit date: 05/07/2012 Discharge date: 05/08/2012  Admission Diagnoses:  appendicitis  Discharge Diagnoses:  same  Active Problems:  * No active hospital problems. *    Surgery:  Laparoscopic appendectomy  Discharged Condition: improved  Hospital Course:   Had laparoscopic appendectomy on Friday night and was better and ready for discharge on Saturday   Consults: none  Significant Diagnostic Studies: none    Discharge Exam: Blood pressure 95/58, pulse 74, temperature 97.9 F (36.6 C), temperature source Oral, resp. rate 14, height 5' 2.5" (1.588 m), weight 138 lb (62.596 kg), SpO2 95.00%. Doing well.  Incisions ok.  llq bruising as noted.  Will check HG and if OK will discharge home.  Rx given for Vicodin 5/500 # 25  Disposition: Final discharge disposition not confirmed  Discharge Orders    Future Orders Please Complete By Expires   Diet - low sodium heart healthy      Increase activity slowly      Discharge instructions      Comments:   May shower and shampoo etc.   No dressing needed        Medication List  As of 05/08/2012  9:59 AM   TAKE these medications         aspirin 81 MG chewable tablet   Chew 81 mg by mouth daily.      gabapentin 300 MG capsule   Commonly known as: NEURONTIN   Take 300 mg by mouth 3 (three) times daily.      multivitamin-lutein Caps   Take 1 capsule by mouth daily.      SUMAtriptan 100 MG tablet   Commonly known as: IMITREX   Take 100 mg by mouth every 2 (two) hours as needed.      vitamin C 500 MG tablet   Commonly known as: ASCORBIC ACID   Take 500 mg by mouth daily.           Follow-up Information    Follow up with Tonnie Stillman B, MD in 3 weeks.   Contact information:   [employer] [address] [place] [address] [phone]          Signed: Valarie Merino 05/08/2012, 9:59 AM

## 2012-05-08 NOTE — Progress Notes (Signed)
Pt DC to home w/husband. DC instructions and medications reviewed w/Pt. Pt questions answered. Pt states understanding.

## 2012-05-08 NOTE — Progress Notes (Signed)
Patient ID: Christina [name], female   DOB: 1951/07/14, 61 y.o.   MRN: [id number] [facility] Progress Note:   1 Day Post-Op  Subjective: Mental status is clear Objective: Vital signs in last 24 hours: Temp:  [97 F (36.1 C)-99.1 F (37.3 C)] 97.9 F (36.6 C) (06/15 4098) Pulse Rate:  [70-130] 74  (06/15 0632) Resp:  [11-21] 14  (06/15 0632) BP: (95-142)/(56-81) 95/58 mmHg (06/15 0632) SpO2:  [95 %-100 %] 95 % (06/15 1191) Weight:  [138 lb (62.596 kg)] 138 lb (62.596 kg) (06/14 2215)  Intake/Output from previous day: 06/14 0701 - 06/15 0700 In: 3558.3 [P.O.:960; I.V.:2598.3] Out: 1000 [Urine:1000] Intake/Output this shift:    Physical Exam: Work of breathing is  Normal.  LLQ bruise at trocar site.  Minimal pain.  Small BM  Lab Results:  Results for orders placed during the hospital encounter of 05/07/12 (from the past 48 hour(s))  ABO/RH     Status: Normal   Collection Time   05/07/12  5:20 PM      Component Value Range Comment   ABO/RH(D) B POS     CBC     Status: Abnormal   Collection Time   05/07/12  5:40 PM      Component Value Range Comment   WBC 12.8 (*) 4.0 - 10.5 K/uL    RBC 4.48  3.87 - 5.11 MIL/uL    Hemoglobin 13.6  12.0 - 15.0 g/dL    HCT 47.8  29.5 - 62.1 %    MCV 89.1  78.0 - 100.0 fL    MCH 30.4  26.0 - 34.0 pg    MCHC 34.1  30.0 - 36.0 g/dL    RDW 30.8  65.7 - 84.6 %    Platelets 265  150 - 400 K/uL   DIFFERENTIAL     Status: Abnormal   Collection Time   05/07/12  5:40 PM      Component Value Range Comment   Neutrophils Relative 79 (*) 43 - 77 %    Neutro Abs 10.1 (*) 1.7 - 7.7 K/uL    Lymphocytes Relative 16  12 - 46 %    Lymphs Abs 2.0  0.7 - 4.0 K/uL    Monocytes Relative 5  3 - 12 %    Monocytes Absolute 0.7  0.1 - 1.0 K/uL    Eosinophils Relative 0  0 - 5 %    Eosinophils Absolute 0.0  0.0 - 0.7 K/uL    Basophils Relative 0  0 - 1 %    Basophils Absolute 0.0  0.0 - 0.1 K/uL   BASIC METABOLIC PANEL     Status: Abnormal   Collection Time   05/07/12  5:40 PM      Component Value Range Comment   Sodium 138  135 - 145 mEq/L    Potassium 3.6  3.5 - 5.1 mEq/L    Chloride 99  96 - 112 mEq/L    CO2 27  19 - 32 mEq/L    Glucose, Bld 108 (*) 70 - 99 mg/dL    BUN 9  6 - 23 mg/dL    Creatinine, Ser 9.62  0.50 - 1.10 mg/dL    Calcium 9.6  8.4 - 95.2 mg/dL    GFR calc non Af Amer 89 (*) >90 mL/min    GFR calc Af Amer >90  >90 mL/min   APTT     Status: Normal   Collection Time   05/07/12  5:40 PM  Component Value Range Comment   aPTT 35  24 - 37 seconds   PROTIME-INR     Status: Normal   Collection Time   05/07/12  5:40 PM      Component Value Range Comment   Prothrombin Time 13.9  11.6 - 15.2 seconds    INR 1.05  0.00 - 1.49   TYPE AND SCREEN     Status: Normal   Collection Time   05/07/12  5:40 PM      Component Value Range Comment   ABO/RH(D) B POS      Antibody Screen NEG      Sample Expiration 05/10/2012       Radiology/Results: Ct Abdomen Pelvis W Contrast  05/07/2012  *RADIOLOGY REPORT*  Clinical Data: Right lower quadrant pain  CT ABDOMEN AND PELVIS WITH CONTRAST  Technique:  Multidetector CT imaging of the abdomen and pelvis was performed following the standard protocol during bolus administration of intravenous contrast.  Contrast:  100 ml Omnipaque-300  Comparison: None.  Findings: Lung bases are clear except for linear scarring.  The liver has a normal appearance without focal lesions or biliary ductal dilatation.  No calcified gallstones.  The spleen is normal. The pancreas is normal.  The adrenal glands are normal.  The kidneys are normal.  The aorta shows mild atherosclerotic change but no aneurysm.  No retroperitoneal mass or adenopathy.  There is acute appendicitis with a dilated appendix and periappendiceal stranding.  No evidence of rupture or abscess.  There is a leiomyoma of the uterus.  No adnexal lesion.  No free fluid in the pelvis.  Previous hip replacement on the right.  IMPRESSION:  Acute appendicitis.  Periappendiceal stranding but no evidence of gross rupture or abscess.  Original Report Authenticated By: Thomasenia Sales, M.D.    Anti-infectives: Anti-infectives     Start     Dose/Rate Route Frequency Ordered Stop   05/07/12 2230   Ampicillin-Sulbactam (UNASYN) 3 g in sodium chloride 0.9 % 100 mL IVPB        3 g 100 mL/hr over 60 Minutes Intravenous Every 6 hours 05/07/12 2227 05/08/12 0002   05/07/12 1700   Ampicillin-Sulbactam (UNASYN) 3 g in sodium chloride 0.9 % 100 mL IVPB        3 g 100 mL/hr over 60 Minutes Intravenous  Once 05/07/12 1657 05/07/12 1825          Assessment/Plan: Problem List: Patient Active Problem List  Diagnosis  . NAUSEA ALONE  . DIARRHEA    Script written for Vicodin 5/500 for pain.  Plan discharge later today if CBC OK.   1 Day Post-Op    LOS: 1 day   Matt B. Daphine Deutscher, MD, [facility] [phone] beeper [phone]  05/08/2012 9:53 AM

## 2012-05-09 ENCOUNTER — Telehealth: Payer: Self-pay | Admitting: Radiology

## 2012-05-09 LAB — URINE CULTURE: Colony Count: 40000

## 2012-05-09 NOTE — Telephone Encounter (Signed)
I called patient about her urine culture, she did grow some bacteria per Dr Patsy Lager and if she is still having issues she needs to have her urine rechecked. She is also recovering from recent Appendectomy, we also need to see how she is feeling after this, I left mssg for her to call us back.

## 2012-05-10 NOTE — Telephone Encounter (Signed)
Spoke with patient and let her know that she did have bacteria growing but she said everything was fine.  She stated that she was alittle sore after the appendectomy but nowhere near as bad as she thought she would feel.  i informed her to come back in for recheck if she had anymore concerns with the urine.

## 2012-05-12 ENCOUNTER — Encounter (HOSPITAL_COMMUNITY): Payer: Self-pay | Admitting: Surgery

## 2012-05-14 ENCOUNTER — Other Ambulatory Visit: Payer: Self-pay | Admitting: Obstetrics and Gynecology

## 2012-06-01 ENCOUNTER — Ambulatory Visit (INDEPENDENT_AMBULATORY_CARE_PROVIDER_SITE_OTHER): Payer: 59 | Admitting: Surgery

## 2012-06-01 ENCOUNTER — Encounter (INDEPENDENT_AMBULATORY_CARE_PROVIDER_SITE_OTHER): Payer: Self-pay | Admitting: Surgery

## 2012-06-01 VITALS — BP 118/74 | HR 60 | Temp 97.8°F | Resp 18 | Ht 62.5 in | Wt 139.0 lb

## 2012-06-01 DIAGNOSIS — K3580 Unspecified acute appendicitis: Secondary | ICD-10-CM

## 2012-06-01 HISTORY — DX: Unspecified acute appendicitis: K35.80

## 2012-06-01 NOTE — Patient Instructions (Addendum)
Thanks for your patience.  If you need further assistance after leaving the office, please call our office and speak with Tracy A.  [phone].  If you want to leave a message for Dr. Pattricia Weiher, please call his office phone at [phone]. 

## 2012-06-01 NOTE — Progress Notes (Signed)
Christina [name] 61 y.o.  Body mass index is 25.02 kg/(m^2).  Patient Active Problem List  Diagnosis  . NAUSEA ALONE  . DIARRHEA    Allergies  Allergen Reactions  . Oxycontin (Oxycodone Hcl Er) Nausea Only    Past Surgical History  Procedure Date  . Hip surgery     4 right hip surgeries  . Tubal ligation   . Achilles tendon surgery     left  . Finger surgery     left middle finger - pin placed  . Laparoscopic appendectomy 05/07/2012    Procedure: APPENDECTOMY LAPAROSCOPIC;  Surgeon: Valarie Merino, MD;  Location: [facility];  Service: General;  Laterality: N/AAbbe Amsterdam, MD No diagnosis found.  Doing well postop.  Incisions OK.  Return prn Matt B. Daphine Deutscher, MD, [facility] [phone] beeper [phone]  06/01/2012 5:01 PM

## 2013-07-14 ENCOUNTER — Encounter: Payer: Self-pay | Admitting: Nurse Practitioner

## 2013-07-14 ENCOUNTER — Ambulatory Visit (INDEPENDENT_AMBULATORY_CARE_PROVIDER_SITE_OTHER): Payer: 59 | Admitting: Nurse Practitioner

## 2013-07-14 VITALS — BP 118/63 | HR 72 | Ht 63.0 in | Wt 143.0 lb

## 2013-07-14 DIAGNOSIS — G43009 Migraine without aura, not intractable, without status migrainosus: Secondary | ICD-10-CM | POA: Insufficient documentation

## 2013-07-14 MED ORDER — SUMATRIPTAN SUCCINATE 100 MG PO TABS: 100.0000 mg | ORAL_TABLET | ORAL | Status: DC | PRN

## 2013-07-14 MED ORDER — PROMETHAZINE HCL 25 MG PO TABS: 25.0000 mg | ORAL_TABLET | Freq: Three times a day (TID) | ORAL | Status: DC | PRN

## 2013-07-14 NOTE — Patient Instructions (Addendum)
Patient to continue gabapentin current meds Continue Imitrex at current dose Continue Phenergan will renew Followup yearly and when necessary

## 2013-07-14 NOTE — Progress Notes (Signed)
  Reason for visit followup for migraine  HPI: Ms. Hollon, 62 year old female returns for followup. She was last seen 12/22/2012. She has a history of headaches which are well controlled with gabapentin.She is taking Gaapentin  600mg  daily for headaches. She takes Imitrex acutely which is beneficial She is also seeing a [facility].   History: Patient has had headaches dating back to age 73, initially occurring over the right eye associated with nausea and vomiting she did not seek medical attention at that time. In her 49s she began to have daily headaches in the back of the head associated with nausea she had no visual symptoms photophobia or phonophobia with these headaches,  amitriptyline controlled her symptoms until she started having palpitations. She has also been on Topamax with paresthesias   ROS:  Negative   Medications Current Outpatient Prescriptions on File Prior to Visit  Medication Sig Dispense Refill  . aspirin 81 MG chewable tablet Chew 81 mg by mouth daily.      Marland Kitchen gabapentin (NEURONTIN) 300 MG capsule Take 300 mg by mouth 3 (three) times daily.      . multivitamin-lutein (OCUVITE-LUTEIN) CAPS Take 1 capsule by mouth daily.      . SUMAtriptan (IMITREX) 100 MG tablet Take 100 mg by mouth every 2 (two) hours as needed.      . vitamin C (ASCORBIC ACID) 500 MG tablet Take 500 mg by mouth daily.       No current facility-administered medications on file prior to visit.    Allergies  Allergies  Allergen Reactions  . Oxycontin [Oxycodone Hcl Er] Nausea Only    Physical Exam General: well developed, well nourished, seated, in no evident distress   Neurologic Exam Mental Status: Awake and fully alert. Oriented to place and time. Follows all commands. Speech and language normal.   Cranial Nerves:  Pupils equal, briskly reactive to light. Extraocular movements full without nystagmus. Visual fields full to confrontation. Hearing intact and symmetric to finger snap. Facial  sensation intact. Face, tongue, palate move normally and symmetrically. Neck flexion and extension normal.  Motor: Normal bulk and tone. Normal strength in all tested extremity muscles.No focal weakness Coordination: Rapid alternating movements normal in all extremities. Finger-to-nose and heel-to-shin performed accurately bilaterally. No dysmetria Gait and Station: Arises from chair without difficulty. Stance is normal. Gait demonstrates normal stride length and balance . Able to heel, toe and tandem walk without difficulty.  Reflexes: 2+ and symmetric except for slightly diminished in the right knee and right ankle. Toes downgoing.     ASSESSMENT: History of migraines doing well on gabapentin and Imitrex. Previously on Topamax, Elavil and Aleve with side effects.      PLAN: Patient to continue gabapentin at  current dose, does not need refills Continue Imitrex at current dose, will refill Continue Phenergan will renew Followup yearly and when necessary  Nilda Riggs, GNP-BC APRN

## 2013-07-14 NOTE — Progress Notes (Signed)
I have read the note, and I agree with the clinical assessment and plan.  Christina [name] KEITH   

## 2013-09-23 ENCOUNTER — Telehealth: Payer: Self-pay | Admitting: *Deleted

## 2013-09-26 NOTE — Telephone Encounter (Signed)
Gabapentin does not have have cardiovascular side effects such as palpitations according to [employer] at [facility].

## 2013-09-27 NOTE — Telephone Encounter (Signed)
I called pt and relayed that palpitations is not a SE from gabapentin, as per below or my med book.  She is wearing a heart monitor from her cardiologist.   She was having dizziness too, could be gabapentin but also heart related.  Pt verbalized understanding.

## 2013-10-27 ENCOUNTER — Other Ambulatory Visit: Payer: Self-pay

## 2013-10-27 MED ORDER — GABAPENTIN 300 MG PO CAPS: 300.0000 mg | ORAL_CAPSULE | Freq: Two times a day (BID) | ORAL | Status: DC

## 2014-05-09 ENCOUNTER — Other Ambulatory Visit: Payer: Self-pay | Admitting: Dermatology

## 2014-07-06 ENCOUNTER — Ambulatory Visit (INDEPENDENT_AMBULATORY_CARE_PROVIDER_SITE_OTHER): Payer: Medicare Other | Admitting: Nurse Practitioner

## 2014-07-06 ENCOUNTER — Encounter: Payer: Self-pay | Admitting: Nurse Practitioner

## 2014-07-06 VITALS — BP 114/62 | HR 57 | Ht 63.0 in | Wt 143.0 lb

## 2014-07-06 DIAGNOSIS — G43009 Migraine without aura, not intractable, without status migrainosus: Secondary | ICD-10-CM

## 2014-07-06 MED ORDER — SUMATRIPTAN SUCCINATE 100 MG PO TABS: 100.0000 mg | ORAL_TABLET | ORAL | Status: DC | PRN

## 2014-07-06 MED ORDER — GABAPENTIN 300 MG PO CAPS: 300.0000 mg | ORAL_CAPSULE | Freq: Two times a day (BID) | ORAL | Status: DC

## 2014-07-06 MED ORDER — PROMETHAZINE HCL 25 MG PO TABS: 25.0000 mg | ORAL_TABLET | Freq: Three times a day (TID) | ORAL | Status: DC | PRN

## 2014-07-06 NOTE — Patient Instructions (Signed)
Continue gabapentin, Imitrex, and Phenergan for  headache Will refill Followup yearly when necessary

## 2014-07-06 NOTE — Progress Notes (Signed)
[facility]  PATIENT: Remmi Armenteros DOB: June 07, 1951   REASON FOR VISIT: Followup for headache   HISTORY OF PRESENT ILLNESS:[name], 63 year old female returns for followup. She was last seen 07/14/13. She has a history of headaches which are well controlled with gabapentin.She is taking Gaapentin 600mg  daily for headaches. She takes Imitrex acutely which is beneficial . She is occasionally nauseated with headache and takes Phenergan. She is currently doing well. She returns for reevaluation.   History: Patient has had headaches dating back to age 47, initially occurring over the right eye associated with nausea and vomiting she did not seek medical attention at that time. In her 48s she began to have daily headaches in the back of the head associated with nausea she had no visual symptoms photophobia or phonophobia with these headaches, amitriptyline controlled her symptoms until she started having palpitations. She has also been on Topamax with paresthesias   REVIEW OF SYSTEMS: Full 14 system review of systems performed and notable only for those listed, all others are neg:  Constitutional: N/A  Cardiovascular: Palpitations  Ear/Nose/Throat: N/A  Skin: N/A  Eyes: N/A  Respiratory: N/A  Gastroitestinal: N/A  Hematology/Lymphatic: N/A  Endocrine: N/A Musculoskeletal:N/A  Allergy/Immunology: N/A  Neurological: N/A Psychiatric: N/A Sleep : NA   ALLERGIES: Allergies  Allergen Reactions  . Oxycontin [Oxycodone Hcl] Nausea Only    HOME MEDICATIONS: Outpatient Prescriptions Prior to Visit  Medication Sig Dispense Refill  . aspirin 81 MG chewable tablet Chew 81 mg by mouth daily.      Marland Kitchen gabapentin (NEURONTIN) 300 MG capsule Take 1 capsule (300 mg total) by mouth 2 (two) times daily.  180 capsule  2  . MATZIM LA 180 MG 24 hr tablet       . multivitamin-lutein (OCUVITE-LUTEIN) CAPS Take 1 capsule by mouth daily.      Marland Kitchen PREMARIN vaginal cream       .  promethazine (PHENERGAN) 25 MG tablet Take 1 tablet (25 mg total) by mouth every 8 (eight) hours as needed for nausea.  30 tablet  1  . SUMAtriptan (IMITREX) 100 MG tablet Take 1 tablet (100 mg total) by mouth every 2 (two) hours as needed.  10 tablet  6  . vitamin C (ASCORBIC ACID) 500 MG tablet Take 500 mg by mouth daily.       No facility-administered medications prior to visit.    PAST MEDICAL HISTORY: Past Medical History  Diagnosis Date  . PONV (postoperative nausea and vomiting)   . Dysrhythmia     saw Dr. Wynonia Lawman - no tx needed    PAST SURGICAL HISTORY: Past Surgical History  Procedure Laterality Date  . Hip surgery      4 right hip surgeries  . Tubal ligation    . Achilles tendon surgery      left  . Finger surgery      left middle finger - pin placed  . Laparoscopic appendectomy  05/07/2012    Procedure: APPENDECTOMY LAPAROSCOPIC;  Surgeon: Pedro Earls, MD;  Location: [facility];  Service: General;  Laterality: N/A;    FAMILY HISTORY: Family History  Problem Relation Age of Onset  . Hypertension Mother   . Hypertension Sister   . Diabetes Sister     SOCIAL HISTORY: History   Social History  . Marital Status: Married    Spouse Name: [name]     Number of Children: 2  . Years of Education: 12+   Occupational History  . Retired  Social History Main Topics  . Smoking status: Never Smoker   . Smokeless tobacco: Never Used  . Alcohol Use: Yes     Comment: socially  . Drug Use: No  . Sexual Activity: Not on file   Other Topics Concern  . Not on file   Social History Narrative   Patient lives at home Husband [name].    Patient has 2 children.    Patient is right handed.    Patient has a 12+ years of education.    Patient is retired.      PHYSICAL EXAM  Filed Vitals:   07/06/14 1534  BP: 114/62  Pulse: 57  Height: 5\' 3"  (1.6 m)  Weight: 143 lb (64.864 kg)   Body mass index is 25.34 kg/(m^2). General: well developed, well nourished,  seated, in no evident distress  Neurologic Exam  Mental Status: Awake and fully alert. Oriented to place and time. Follows all commands. Speech and language normal.  Cranial Nerves: Pupils equal, briskly reactive to light. Extraocular movements full without nystagmus. Visual fields full to confrontation. Hearing intact and symmetric to finger snap. Facial sensation intact. Face, tongue, palate move normally and symmetrically. Neck flexion and extension normal.  Motor: Normal bulk and tone. Normal strength in all tested extremity muscles.No focal weakness  Coordination: Rapid alternating movements normal in all extremities. Finger-to-nose and heel-to-shin performed accurately bilaterally. No dysmetria  Gait and Station: Arises from chair without difficulty. Stance is normal. Gait demonstrates normal stride length and balance . Able to heel, toe and tandem walk without difficulty.  Reflexes: 2+ and symmetric except for slightly diminished in the right knee and right ankle. Toes downgoing.   DIAGNOSTIC DATA (LABS, IMAGING, TESTING) ASSESSMENT AND PLAN  63 y.o. year old female  has a past medical history of  migraines doing well on gabapentin and Imitrex.   Continue gabapentin, Imitrex, and Phenergan for  headache Will refill Followup yearly when necessary Dennie Bible, [facility], [facility], APRN  [facility] [address], [place], [place] [address] [phone]

## 2014-07-13 NOTE — Progress Notes (Signed)
I reviewed note and agree with plan.   VIKRAM R. PENUMALLI, MD  Certified in Neurology, Neurophysiology and Neuroimaging  [facility] [address], [address] [place], [place] [address] [phone]   

## 2014-07-14 ENCOUNTER — Ambulatory Visit: Payer: 59 | Admitting: Nurse Practitioner

## 2014-08-15 ENCOUNTER — Other Ambulatory Visit: Payer: Self-pay | Admitting: *Deleted

## 2014-08-15 DIAGNOSIS — G43909 Migraine, unspecified, not intractable, without status migrainosus: Secondary | ICD-10-CM

## 2014-08-15 MED ORDER — GABAPENTIN 300 MG PO CAPS: 300.0000 mg | ORAL_CAPSULE | Freq: Two times a day (BID) | ORAL | Status: DC

## 2014-08-30 ENCOUNTER — Encounter: Payer: Self-pay | Admitting: Family Medicine

## 2014-08-30 DIAGNOSIS — R002 Palpitations: Secondary | ICD-10-CM

## 2014-08-30 HISTORY — DX: Palpitations: R00.2

## 2014-09-04 ENCOUNTER — Encounter: Payer: Self-pay | Admitting: Family Medicine

## 2014-09-04 DIAGNOSIS — I480 Paroxysmal atrial fibrillation: Secondary | ICD-10-CM

## 2014-09-04 HISTORY — DX: Paroxysmal atrial fibrillation: I48.0

## 2014-10-18 ENCOUNTER — Encounter: Payer: Self-pay | Admitting: Diagnostic Neuroimaging

## 2014-10-23 ENCOUNTER — Telehealth: Payer: Self-pay | Admitting: Nurse Practitioner

## 2014-10-23 DIAGNOSIS — G43009 Migraine without aura, not intractable, without status migrainosus: Secondary | ICD-10-CM

## 2014-10-23 MED ORDER — GABAPENTIN 300 MG PO CAPS: ORAL_CAPSULE | ORAL | Status: DC

## 2014-10-23 NOTE — Telephone Encounter (Signed)
She can increase her Gabapentin 300mg  to 3 caps daily 1 am and 2 hs . I hope she is still taking Imitrex acutely.

## 2014-10-23 NOTE — Telephone Encounter (Signed)
Rx has been updated.  I called back.  Relayed new instructions.  Patient verbalized understanding.  She would like Rx sent to [facility].  Rx has been sent.

## 2014-10-23 NOTE — Telephone Encounter (Signed)
I called back.  Patient states she has 3-4 headaches per week, and this has been occuring for the past 4 weeks.  She would like to know if the Gabapentin dose can be increased, or if something else is recommended.  Please advise.  Thank you.

## 2014-10-23 NOTE — Telephone Encounter (Signed)
Patient is calling because she has been taking Gabapentin 300mg  for headaches but patient is having more headaches and is wondering if she should increase the dosage. Please call patient and advise. [facility].

## 2015-01-02 ENCOUNTER — Telehealth: Payer: Self-pay | Admitting: Nurse Practitioner

## 2015-01-02 NOTE — Telephone Encounter (Signed)
I called back, got no answer.  Left message.  

## 2015-01-02 NOTE — Telephone Encounter (Signed)
Patient calling stating that she is taking 300 mg Gabapentin in the morning 600 mg at night  sumatriptan as needed. Patient has had a headache for several days with very little relief.

## 2015-01-02 NOTE — Telephone Encounter (Signed)
TC to patient. She has had more headaches with the recent weather changes. The imitrex works but headaches come back the same week. She was asked to continue Gabapentin as already ordered but take an Aleve  with the Imitrex to see if that will work better, If it does not call me back.

## 2015-01-02 NOTE — Telephone Encounter (Signed)
Patient stated she's still experiencing headaches after taking Rx gabapentin (NEURONTIN) 300 MG capsule.  Please call and advise.

## 2015-06-14 ENCOUNTER — Encounter: Payer: Self-pay | Admitting: Gastroenterology

## 2015-07-09 ENCOUNTER — Encounter: Payer: Self-pay | Admitting: Nurse Practitioner

## 2015-07-09 ENCOUNTER — Ambulatory Visit (INDEPENDENT_AMBULATORY_CARE_PROVIDER_SITE_OTHER): Payer: Medicare Other | Admitting: Nurse Practitioner

## 2015-07-09 ENCOUNTER — Ambulatory Visit: Payer: Medicare Other | Admitting: Nurse Practitioner

## 2015-07-09 VITALS — BP 104/59 | HR 64 | Ht 63.0 in | Wt 143.4 lb

## 2015-07-09 DIAGNOSIS — G43009 Migraine without aura, not intractable, without status migrainosus: Secondary | ICD-10-CM | POA: Diagnosis not present

## 2015-07-09 NOTE — Progress Notes (Signed)
I reviewed note and agree with plan.   Penni Bombard, MD 5/72/6203, 5:59 PM Certified in Neurology, Neurophysiology and Neuroimaging  [facility] [address] [place], [place] [address] [phone]

## 2015-07-09 NOTE — Progress Notes (Signed)
[facility]  PATIENT: Christina [name] DOB: [birth year]   REASON FOR VISIT: Follow-up for headache/migraine HISTORY FROM: Patient    HISTORY OF PRESENT ILLNESS:Christina [name], 64 year old female returns for followup. She was last seen 07/06/14. She has a history of headaches which are well controlled with gabapentin.She is taking Gapentin 900mg  daily for headaches. She takes Imitrex acutely which is beneficial . She is occasionally nauseated with headache and takes Phenergan. She is currently doing well. She returns for reevaluation.  History: Patient has had headaches dating back to age 2, initially occurring over the right eye associated with nausea and vomiting she did not seek medical attention at that time. In her 12s she began to have daily headaches in the back of the head associated with nausea she had no visual symptoms photophobia or phonophobia with these headaches, amitriptyline controlled her symptoms until she started having palpitations. She has also been on Topamax with paresthesias   REVIEW OF SYSTEMS: Full 14 system review of systems performed and notable only for those listed, all others are neg:  Constitutional: neg  Cardiovascular: neg Ear/Nose/Throat: neg  Skin: neg Eyes: neg Respiratory: neg Gastroitestinal: neg  Hematology/Lymphatic: neg  Endocrine: neg Musculoskeletal:neg Allergy/Immunology: neg Neurological: Headache Psychiatric: neg Sleep : neg   ALLERGIES: Allergies  Allergen Reactions  . Oxycontin [Oxycodone Hcl] Nausea Only    HOME MEDICATIONS: Outpatient Prescriptions Prior to Visit  Medication Sig Dispense Refill  . atenolol (TENORMIN) 25 MG tablet Take 75 mg by mouth daily.     Marland Kitchen gabapentin (NEURONTIN) 300 MG capsule One capsule in the morning and two capsules at bedtime 270 capsule 2  . MATZIM LA 180 MG 24 hr tablet Take 180 mg by mouth daily.     . multivitamin-lutein (OCUVITE-LUTEIN) CAPS Take 1 capsule by mouth  daily.    Marland Kitchen PREMARIN vaginal cream     . promethazine (PHENERGAN) 25 MG tablet Take 1 tablet (25 mg total) by mouth every 8 (eight) hours as needed for nausea. 30 tablet 2  . SUMAtriptan (IMITREX) 100 MG tablet Take 1 tablet (100 mg total) by mouth every 2 (two) hours as needed. 10 tablet 11  . vitamin C (ASCORBIC ACID) 500 MG tablet Take 500 mg by mouth daily.    Marland Kitchen aspirin 81 MG chewable tablet Chew 81 mg by mouth daily.     No facility-administered medications prior to visit.    PAST MEDICAL HISTORY: Past Medical History  Diagnosis Date  . PONV (postoperative nausea and vomiting)   . Dysrhythmia     saw Dr. Wynonia Lawman - no tx needed    PAST SURGICAL HISTORY: Past Surgical History  Procedure Laterality Date  . Hip surgery      4 right hip surgeries  . Tubal ligation    . Achilles tendon surgery      left  . Finger surgery      left middle finger - pin placed  . Laparoscopic appendectomy  05/07/2012    Procedure: APPENDECTOMY LAPAROSCOPIC;  Surgeon: Christina Earls, MD;  Location: [facility];  Service: General;  Laterality: N/A;    FAMILY HISTORY: Family History  Problem Relation Age of Onset  . Hypertension Mother   . Hypertension Sister   . Diabetes Sister     SOCIAL HISTORY: Social History   Social History  . Marital Status: Married    Spouse Name: Christina [name]   . Number of Children: 2  . Years of Education: 12+   Occupational History  .  Retired     Social History Main Topics  . Smoking status: Never Smoker   . Smokeless tobacco: Never Used  . Alcohol Use: Yes     Comment: socially  . Drug Use: No  . Sexual Activity: Not on file   Other Topics Concern  . Not on file   Social History Narrative   Patient lives at home Husband Christina [name].    Patient has 2 children.    Patient is right handed.    Patient has a 12+ years of education.    Patient is retired.      PHYSICAL EXAM  Filed Vitals:   07/09/15 1333  BP: 104/59  Pulse: 64  Height: 5\' 3"  (1.6 m)    Weight: 143 lb 6.4 oz (65.046 kg)   Body mass index is 25.41 kg/(m^2). General: well developed, well nourished, seated, in no evident distress  Neurologic Exam  Mental Status: Awake and fully alert. Oriented to place and time. Follows all commands. Speech and language normal.  Cranial Nerves: Pupils equal, briskly reactive to light. Extraocular movements full without nystagmus. Visual fields full to confrontation. Hearing intact and symmetric to finger snap. Facial sensation intact. Face, tongue, palate move normally and symmetrically. Neck flexion and extension normal.  Motor: Normal bulk and tone. Normal strength in all tested extremity muscles.No focal weakness  Coordination: Rapid alternating movements normal in all extremities. Finger-to-nose and heel-to-shin performed accurately bilaterally. No dysmetria  Gait and Station: Arises from chair without difficulty. Stance is normal. Gait demonstrates normal stride length and balance . Able to heel, toe and tandem walk without difficulty.  Reflexes: 2+ and symmetric except for slightly diminished in the right knee and right ankle. Toes downgoing.    DIAGNOSTIC DATA (LABS, IMAGING, TESTING) -  ASSESSMENT AND PLAN  64 y.o. year old female  has a past medical history of headaches. Her headaches are currently controlled on gabapentin and Imitrex. She would like to reduce her gabapentin to total dose 600 mg daily. If her headaches returned she will go back up to 900 mg  Decrease Gabapentin to 300mg  twice daily will call for refills Continue Imitrex at current dose will call for refills F/U  Yearly Dennie Bible, [facility], [facility], APRN  [facility] [address], [place], [place] [address] [phone]

## 2015-07-09 NOTE — Patient Instructions (Addendum)
Continue Gabapentin at current dose will call for refills Continue Imitrex at current dose will call for refills F/U  yearly

## 2015-07-13 ENCOUNTER — Other Ambulatory Visit: Payer: Self-pay | Admitting: Diagnostic Neuroimaging

## 2015-07-13 NOTE — Telephone Encounter (Signed)
Last OV note says: Decrease Gabapentin to 300mg  twice daily will call for refills

## 2015-08-02 ENCOUNTER — Telehealth: Payer: Self-pay | Admitting: Nurse Practitioner

## 2015-08-02 NOTE — Telephone Encounter (Signed)
It appears dose was decreased at last OV, and a one year Rx was sent to [facility] Rx for this dose.  I called back to clarify.  Got no answer.  Left message.

## 2015-08-02 NOTE — Telephone Encounter (Signed)
Patient is calling because she needs a Rx called in for gabapentin (NEURONTIN) 300 MG capsule. The patient takes 3 pills a day. Please call to [facility]. Thank you.

## 2015-08-06 MED ORDER — GABAPENTIN 300 MG PO CAPS: 300.0000 mg | ORAL_CAPSULE | Freq: Three times a day (TID) | ORAL | Status: DC

## 2015-08-06 NOTE — Telephone Encounter (Signed)
Pt wants to know if her rx for Gabapentin can be changed for 3xdaily dosage instead of the 2xdaily. Pt had discussed with dr about decreasing medication but is in the process of moving and would like to wait until after her move. May call pt at [phone]

## 2015-08-06 NOTE — Telephone Encounter (Signed)
Last OV note says: She would like to reduce her gabapentin to total dose 600 mg daily. If her headaches returned she will go back up to 900 mg Rx has been sent.  I called the patient back.  Got no answer.  Left message.

## 2015-08-10 ENCOUNTER — Other Ambulatory Visit: Payer: Self-pay

## 2015-08-10 MED ORDER — GABAPENTIN 300 MG PO CAPS: 300.0000 mg | ORAL_CAPSULE | Freq: Three times a day (TID) | ORAL | Status: DC

## 2015-08-21 ENCOUNTER — Other Ambulatory Visit: Payer: Self-pay | Admitting: Nurse Practitioner

## 2015-08-31 ENCOUNTER — Other Ambulatory Visit: Payer: Self-pay

## 2015-08-31 MED ORDER — GABAPENTIN 300 MG PO CAPS: 300.0000 mg | ORAL_CAPSULE | Freq: Three times a day (TID) | ORAL | Status: DC

## 2015-08-31 NOTE — Telephone Encounter (Addendum)
Pt called and would like to continue taking gabapentin (NEURONTIN) 300 MG capsule 3 x daily. She does not want to decrease just yet. Pt is requesting refills to last till Aug 2017. May call [phone]

## 2015-08-31 NOTE — Telephone Encounter (Signed)
Rx sent.  I called the patient back to advise.  She is aware.

## 2015-09-17 ENCOUNTER — Ambulatory Visit (INDEPENDENT_AMBULATORY_CARE_PROVIDER_SITE_OTHER): Payer: Medicare Other

## 2015-09-17 ENCOUNTER — Ambulatory Visit (INDEPENDENT_AMBULATORY_CARE_PROVIDER_SITE_OTHER): Payer: Medicare Other | Admitting: Family Medicine

## 2015-09-17 VITALS — BP 118/70 | HR 57 | Temp 98.2°F | Resp 18 | Ht 63.0 in | Wt 139.0 lb

## 2015-09-17 DIAGNOSIS — M7062 Trochanteric bursitis, left hip: Secondary | ICD-10-CM | POA: Diagnosis not present

## 2015-09-17 DIAGNOSIS — M25552 Pain in left hip: Secondary | ICD-10-CM | POA: Diagnosis not present

## 2015-09-17 MED ORDER — METHYLPREDNISOLONE ACETATE 80 MG/ML IJ SUSP
40.0000 mg | Freq: Once | INTRAMUSCULAR | Status: AC
  Administered 2015-09-17: 40 mg via INTRA_ARTICULAR

## 2015-09-17 NOTE — Progress Notes (Signed)
[facility] [address], [place] [address] [phone]  Date:  09/17/2015   Name:  Christina [name]   DOB:  12/07/1950   MRN:  [id number]  PCP:  Lamar Blinks, MD    Chief Complaint: Hip Pain   History of Present Illness:  Christina [name] is a 64 y.o. very pleasant female patient who presents with the following:  Here today as a new patient as her last visit was just over 3 years ago when we dx her with appendicitis.  Here today with concern of pain left hip for 2 weeks History of right hip replacement- this was last in 1997 It hurts to lie on the hip, and when she stands up They did move into a new home recently so she has been doing a lot of physical activity- OW no known injury  The pain does not radiate down her leg  She has tried ice, tylenol prn  She did have paroxysmal a fib last year picked up on a heart monitor for palpitations- she takes eliquis and is on atenolol.  As far as she knows she is back in NSR  Patient Active Problem List   Diagnosis Date Noted  . Paroxysmal atrial fibrillation ([place]) 09/04/2014  . Palpitations 08/30/2014  . Migraine without aura 07/14/2013  . Appendicitis, acute 06/01/2012  . NAUSEA ALONE 03/29/2008  . DIARRHEA 03/29/2008    Past Medical History  Diagnosis Date  . PONV (postoperative nausea and vomiting)   . Dysrhythmia     saw Dr. Wynonia Lawman - no tx needed    Past Surgical History  Procedure Laterality Date  . Hip surgery      4 right hip surgeries  . Tubal ligation    . Achilles tendon surgery      left  . Finger surgery      left middle finger - pin placed  . Laparoscopic appendectomy  05/07/2012    Procedure: APPENDECTOMY LAPAROSCOPIC;  Surgeon: Pedro Earls, MD;  Location: [facility];  Service: General;  Laterality: N/A;    Social History  Substance Use Topics  . Smoking status: Never Smoker   . Smokeless tobacco: Never Used  . Alcohol Use: Yes     Comment: socially    Family History   Problem Relation Age of Onset  . Hypertension Mother   . Hypertension Sister   . Diabetes Sister     Allergies  Allergen Reactions  . Oxycontin [Oxycodone Hcl] Nausea Only    Medication list has been reviewed and updated.  Current Outpatient Prescriptions on File Prior to Visit  Medication Sig Dispense Refill  . atenolol (TENORMIN) 25 MG tablet Take 75 mg by mouth daily.     Marland Kitchen ELIQUIS 5 MG TABS tablet Take 5 mg by mouth 2 (two) times daily.     Marland Kitchen gabapentin (NEURONTIN) 300 MG capsule Take 1 capsule (300 mg total) by mouth 3 (three) times daily. 270 capsule 2  . MATZIM LA 180 MG 24 hr tablet Take 180 mg by mouth daily.     . multivitamin-lutein (OCUVITE-LUTEIN) CAPS Take 1 capsule by mouth daily.    Marland Kitchen PREMARIN vaginal cream     . promethazine (PHENERGAN) 25 MG tablet TAKE 1 TABLET (25 MG TOTAL) BY MOUTH EVERY 8 (EIGHT) HOURS AS NEEDED FOR NAUSEA. 30 tablet 2  . SUMAtriptan (IMITREX) 100 MG tablet TAKE 1 TABLET (100 MG TOTAL) BY MOUTH EVERY 2 (TWO) HOURS AS NEEDED. 10 tablet 11  . vitamin  C (ASCORBIC ACID) 500 MG tablet Take 500 mg by mouth daily.     No current facility-administered medications on file prior to visit.    Review of Systems:  As per HPI- otherwise negative.   Physical Examination: Filed Vitals:   09/17/15 1019  BP: 118/70  Pulse: 57  Temp: 98.2 F (36.8 C)  Resp: 18   Filed Vitals:   09/17/15 1019  Height: 5\' 3"  (1.6 m)  Weight: 139 lb (63.05 kg)   Body mass index is 24.63 kg/(m^2). Ideal Body Weight: Weight in (lb) to have BMI = 25: 140.8  GEN: WDWN, NAD, Non-toxic, A & O x 3, looks well HEENT: Atraumatic, Normocephalic. Neck supple. No masses, No LAD. Ears and Nose: No external deformity. CV: RRR, No M/G/R. No JVD. No thrill. No extra heart sounds. PULM: CTA B, no wheezes, crackles, rhonchi. No retractions. No resp. distress. No accessory muscle use. ABD: S, NT, ND EXTR: No c/c/e NEURO Normal gait.  PSYCH: Normally interactive. Conversant.  Not depressed or anxious appearing.  Calm demeanor.  Tender over the left greater trochanter.  Otherwise hip is non- tender, full ROM, no pain with internal and external rotation of hip joint Normal BLE strength and senstaion  [facility] reading (PRIMARY) by  Dr. Lorelei Pont. Left hip: negative, s/p right total hip  VC obtained. Area over left greater trochanter prepped with betadine and alcohol. Anesthesia with ethyl chloride spray.   Injected into greater trochanteric bursa with 40mg  of depo- medrol and 21ml of 1% lido.  Pt tolerated well Applied band- aid She did notice immediate improvement of her pain   Assessment and Plan: Trochanteric bursitis of left hip - Plan: methylPREDNISolone acetate (DEPO-MEDROL) injection 40 mg  Left hip pain - Plan: DG HIP UNILAT W OR W/O PELVIS 2-3 VIEWS LEFT  Treated for trochanteric bursitis with injection of depo- medrol and lidocaine.  Seems to have helped her so we hope this will resolve her issue. She will let me know if pain returns   Signed Lamar Blinks, MD

## 2015-09-17 NOTE — Patient Instructions (Signed)
We did an injection for your left hip today.  I hope that this will help with your pain!  Take it easy today. Apply ice to the hip If you have any significant bruising or other concerns let me know- also let me know if your hip is not feeling better over the next few days

## 2015-10-05 ENCOUNTER — Ambulatory Visit (INDEPENDENT_AMBULATORY_CARE_PROVIDER_SITE_OTHER): Payer: Medicare Other | Admitting: Family Medicine

## 2015-10-05 VITALS — BP 118/76 | HR 58 | Temp 98.2°F | Resp 16 | Ht 63.0 in | Wt 140.0 lb

## 2015-10-05 DIAGNOSIS — M25552 Pain in left hip: Secondary | ICD-10-CM | POA: Diagnosis not present

## 2015-10-05 NOTE — Progress Notes (Signed)
[facility] [address], [place] [address] [phone]  Date:  10/05/2015   Name:  Christina [name]   DOB:  January 29, 1951   MRN:  [id number]  PCP:  Lamar Blinks, MD    Chief Complaint: Follow-up   History of Present Illness:  Christina [name] is a 64 y.o. very pleasant female patient who presents with the following:  I saw her about 3 weeks ago with left hip pain for 2 weeks.  She did have a RIGHT hip repaclement years ago due to a fall from a horse.  X-ray from last visit as follows:  DG HIP (WITH OR WITHOUT PELVIS) 2-3V LEFT  COMPARISON: None.  FINDINGS: Frontal pelvis as well as frontal and lateral left hip images were obtained. There is a total hip prosthesis on the right with prosthetic components appearing well-seated. No fracture or dislocation. Left hip joint appears intact. No erosive change.  IMPRESSION: No acute fracture or dislocation. No appreciable left hip joint arthropathy. Total hip prosthesis noted on the right.  We did an injection of steroids and lidocaine that helped at the time- it helped for about one week.  The pain as returned just like it did before.  She has pain with walking and with lying on her left side They are back to more normal activity level again She is using tylenol- this does help while it lasts.  She is taking it about every 6 hours.    Patient Active Problem List   Diagnosis Date Noted  . Paroxysmal atrial fibrillation ([place]) 09/04/2014  . Palpitations 08/30/2014  . Migraine without aura 07/14/2013  . Appendicitis, acute 06/01/2012  . NAUSEA ALONE 03/29/2008  . DIARRHEA 03/29/2008    Past Medical History  Diagnosis Date  . PONV (postoperative nausea and vomiting)   . Dysrhythmia     saw Dr. Wynonia Lawman - no tx needed    Past Surgical History  Procedure Laterality Date  . Hip surgery      4 right hip surgeries  . Tubal ligation    . Achilles tendon surgery      left  . Finger surgery     left middle finger - pin placed  . Laparoscopic appendectomy  05/07/2012    Procedure: APPENDECTOMY LAPAROSCOPIC;  Surgeon: Pedro Earls, MD;  Location: [facility];  Service: General;  Laterality: N/A;    Social History  Substance Use Topics  . Smoking status: Never Smoker   . Smokeless tobacco: Never Used  . Alcohol Use: Yes     Comment: socially    Family History  Problem Relation Age of Onset  . Hypertension Mother   . Hypertension Sister   . Diabetes Sister     Allergies  Allergen Reactions  . Oxycontin [Oxycodone Hcl] Nausea Only    Medication list has been reviewed and updated.  Current Outpatient Prescriptions on File Prior to Visit  Medication Sig Dispense Refill  . atenolol (TENORMIN) 25 MG tablet Take 75 mg by mouth daily.     Marland Kitchen ELIQUIS 5 MG TABS tablet Take 5 mg by mouth 2 (two) times daily.     Marland Kitchen gabapentin (NEURONTIN) 300 MG capsule Take 1 capsule (300 mg total) by mouth 3 (three) times daily. 270 capsule 2  . MATZIM LA 180 MG 24 hr tablet Take 180 mg by mouth daily.     . multivitamin-lutein (OCUVITE-LUTEIN) CAPS Take 1 capsule by mouth daily.    Marland Kitchen PREMARIN vaginal cream     .  promethazine (PHENERGAN) 25 MG tablet TAKE 1 TABLET (25 MG TOTAL) BY MOUTH EVERY 8 (EIGHT) HOURS AS NEEDED FOR NAUSEA. 30 tablet 2  . SUMAtriptan (IMITREX) 100 MG tablet TAKE 1 TABLET (100 MG TOTAL) BY MOUTH EVERY 2 (TWO) HOURS AS NEEDED. 10 tablet 11  . vitamin C (ASCORBIC ACID) 500 MG tablet Take 500 mg by mouth daily.     No current facility-administered medications on file prior to visit.    Review of Systems:  As per HPI- otherwise negative.   Physical Examination: Filed Vitals:   10/05/15 0945  BP: 118/76  Pulse: 58  Temp: 98.2 F (36.8 C)  Resp: 16   Filed Vitals:   10/05/15 0945  Height: 5\' 3"  (1.6 m)  Weight: 140 lb (63.504 kg)   Body mass index is 24.81 kg/(m^2). Ideal Body Weight: Weight in (lb) to have BMI = 25: 140.8  GEN: WDWN, NAD, Non-toxic, A & O x 3,  looks well, normal weight HEENT: Atraumatic, Normocephalic. Neck supple. No masses, No LAD. Ears and Nose: No external deformity. CV: RRR, No M/G/R. No JVD. No thrill. No extra heart sounds. PULM: CTA B, no wheezes, crackles, rhonchi. No retractions. No resp. distress. No accessory muscle use. ABD: S, NT, ND EXTR: No c/c/e NEURO Normal gait.  PSYCH: Normally interactive. Conversant. Not depressed or anxious appearing.  Calm demeanor.  Left hip: she again has tenderness over the greater trochanter No appreciable leg length difference She is tender again over the left greater trochanter.  No pain with rotation of hip. Pain in lateral hip with flexion and abduction of the hip.   Assessment and Plan: Left hip pain  Recurrent left lateral hip pain.   She cannot use NSAIDs as she is on eliquis Referral to PT Continue tylenol as needed, ice TID If not better in one month refer to ortho- [place] if worse.     Signed Lamar Blinks, MD

## 2015-10-05 NOTE — Patient Instructions (Signed)
I will refer you to physical therapy to look at your hip and work on your pain.  Continue taking the tylenol as needed for your pain Also try applying ice for 20 minutes 3x a day If after a month you are no better we will refer you to orthopedics.

## 2015-10-12 ENCOUNTER — Telehealth: Payer: Self-pay

## 2015-10-12 NOTE — Telephone Encounter (Signed)
Sent to xray.

## 2015-10-12 NOTE — Telephone Encounter (Signed)
Patient is calling because would like her hip x-ray sent to Dr. Wynelle Link at [facility].

## 2015-10-16 ENCOUNTER — Telehealth: Payer: Self-pay

## 2015-10-16 DIAGNOSIS — M25552 Pain in left hip: Secondary | ICD-10-CM

## 2015-10-16 NOTE — Telephone Encounter (Signed)
The patient called about a referral to physical therapy.  She said Dr Lorelei Pont would refer her following her 10/05/15 OV, but it does not appear to have been placed.  Please advise, thank you.  Assessment and Plan: Left hip pain  Recurrent left lateral hip pain.  She cannot use NSAIDs as she is on eliquis Referral to PT Continue tylenol as needed, ice TID If not better in one month refer to ortho- [place] if worse.

## 2015-10-17 ENCOUNTER — Ambulatory Visit: Payer: Medicare Other | Admitting: Family Medicine

## 2015-11-28 DIAGNOSIS — M7060 Trochanteric bursitis, unspecified hip: Secondary | ICD-10-CM | POA: Diagnosis not present

## 2015-11-28 DIAGNOSIS — M25552 Pain in left hip: Secondary | ICD-10-CM | POA: Diagnosis not present

## 2015-11-28 DIAGNOSIS — R2689 Other abnormalities of gait and mobility: Secondary | ICD-10-CM | POA: Diagnosis not present

## 2015-12-04 DIAGNOSIS — H353111 Nonexudative age-related macular degeneration, right eye, early dry stage: Secondary | ICD-10-CM | POA: Diagnosis not present

## 2015-12-04 DIAGNOSIS — H5203 Hypermetropia, bilateral: Secondary | ICD-10-CM | POA: Diagnosis not present

## 2015-12-04 DIAGNOSIS — Z7901 Long term (current) use of anticoagulants: Secondary | ICD-10-CM | POA: Diagnosis not present

## 2015-12-04 DIAGNOSIS — I480 Paroxysmal atrial fibrillation: Secondary | ICD-10-CM | POA: Diagnosis not present

## 2015-12-04 DIAGNOSIS — I491 Atrial premature depolarization: Secondary | ICD-10-CM | POA: Diagnosis not present

## 2015-12-04 DIAGNOSIS — I493 Ventricular premature depolarization: Secondary | ICD-10-CM | POA: Diagnosis not present

## 2015-12-04 DIAGNOSIS — H52223 Regular astigmatism, bilateral: Secondary | ICD-10-CM | POA: Diagnosis not present

## 2015-12-04 DIAGNOSIS — H353121 Nonexudative age-related macular degeneration, left eye, early dry stage: Secondary | ICD-10-CM | POA: Diagnosis not present

## 2015-12-14 ENCOUNTER — Encounter: Payer: Self-pay | Admitting: Family Medicine

## 2015-12-19 ENCOUNTER — Encounter: Payer: Self-pay | Admitting: Family Medicine

## 2016-01-10 ENCOUNTER — Other Ambulatory Visit: Payer: Self-pay | Admitting: Nurse Practitioner

## 2016-02-21 DIAGNOSIS — L821 Other seborrheic keratosis: Secondary | ICD-10-CM | POA: Diagnosis not present

## 2016-02-21 DIAGNOSIS — D485 Neoplasm of uncertain behavior of skin: Secondary | ICD-10-CM | POA: Diagnosis not present

## 2016-02-21 DIAGNOSIS — L570 Actinic keratosis: Secondary | ICD-10-CM | POA: Diagnosis not present

## 2016-02-22 DIAGNOSIS — L738 Other specified follicular disorders: Secondary | ICD-10-CM | POA: Diagnosis not present

## 2016-02-22 DIAGNOSIS — C44622 Squamous cell carcinoma of skin of right upper limb, including shoulder: Secondary | ICD-10-CM | POA: Diagnosis not present

## 2016-04-01 DIAGNOSIS — C44622 Squamous cell carcinoma of skin of right upper limb, including shoulder: Secondary | ICD-10-CM | POA: Diagnosis not present

## 2016-04-01 DIAGNOSIS — L237 Allergic contact dermatitis due to plants, except food: Secondary | ICD-10-CM | POA: Diagnosis not present

## 2016-04-03 DIAGNOSIS — C44622 Squamous cell carcinoma of skin of right upper limb, including shoulder: Secondary | ICD-10-CM | POA: Diagnosis not present

## 2016-04-13 DIAGNOSIS — L259 Unspecified contact dermatitis, unspecified cause: Secondary | ICD-10-CM | POA: Diagnosis not present

## 2016-04-22 DIAGNOSIS — T814XXA Infection following a procedure, initial encounter: Secondary | ICD-10-CM | POA: Diagnosis not present

## 2016-04-22 DIAGNOSIS — L309 Dermatitis, unspecified: Secondary | ICD-10-CM | POA: Diagnosis not present

## 2016-05-02 DIAGNOSIS — T148 Other injury of unspecified body region: Secondary | ICD-10-CM | POA: Diagnosis not present

## 2016-05-05 DIAGNOSIS — T8189XD Other complications of procedures, not elsewhere classified, subsequent encounter: Secondary | ICD-10-CM | POA: Diagnosis not present

## 2016-05-05 DIAGNOSIS — L821 Other seborrheic keratosis: Secondary | ICD-10-CM | POA: Diagnosis not present

## 2016-05-05 DIAGNOSIS — L309 Dermatitis, unspecified: Secondary | ICD-10-CM | POA: Diagnosis not present

## 2016-05-09 DIAGNOSIS — D485 Neoplasm of uncertain behavior of skin: Secondary | ICD-10-CM | POA: Diagnosis not present

## 2016-05-09 DIAGNOSIS — T8189XA Other complications of procedures, not elsewhere classified, initial encounter: Secondary | ICD-10-CM | POA: Diagnosis not present

## 2016-06-03 DIAGNOSIS — H52223 Regular astigmatism, bilateral: Secondary | ICD-10-CM | POA: Diagnosis not present

## 2016-06-03 DIAGNOSIS — H5203 Hypermetropia, bilateral: Secondary | ICD-10-CM | POA: Diagnosis not present

## 2016-06-03 DIAGNOSIS — H353111 Nonexudative age-related macular degeneration, right eye, early dry stage: Secondary | ICD-10-CM | POA: Diagnosis not present

## 2016-06-03 DIAGNOSIS — H524 Presbyopia: Secondary | ICD-10-CM | POA: Diagnosis not present

## 2016-06-03 DIAGNOSIS — H353121 Nonexudative age-related macular degeneration, left eye, early dry stage: Secondary | ICD-10-CM | POA: Diagnosis not present

## 2016-06-11 DIAGNOSIS — L821 Other seborrheic keratosis: Secondary | ICD-10-CM | POA: Diagnosis not present

## 2016-06-11 DIAGNOSIS — D225 Melanocytic nevi of trunk: Secondary | ICD-10-CM | POA: Diagnosis not present

## 2016-06-11 DIAGNOSIS — Z85828 Personal history of other malignant neoplasm of skin: Secondary | ICD-10-CM | POA: Diagnosis not present

## 2016-06-11 DIAGNOSIS — Z411 Encounter for cosmetic surgery: Secondary | ICD-10-CM | POA: Diagnosis not present

## 2016-07-08 ENCOUNTER — Encounter: Payer: Self-pay | Admitting: Nurse Practitioner

## 2016-07-08 ENCOUNTER — Ambulatory Visit (INDEPENDENT_AMBULATORY_CARE_PROVIDER_SITE_OTHER): Payer: Medicare Other | Admitting: Nurse Practitioner

## 2016-07-08 VITALS — BP 104/57 | HR 52 | Ht 63.0 in | Wt 141.0 lb

## 2016-07-08 DIAGNOSIS — G43009 Migraine without aura, not intractable, without status migrainosus: Secondary | ICD-10-CM

## 2016-07-08 MED ORDER — PROMETHAZINE HCL 25 MG PO TABS: ORAL_TABLET | ORAL | 3 refills | Status: DC

## 2016-07-08 MED ORDER — GABAPENTIN 300 MG PO CAPS: 300.0000 mg | ORAL_CAPSULE | Freq: Three times a day (TID) | ORAL | 3 refills | Status: DC

## 2016-07-08 MED ORDER — SUMATRIPTAN SUCCINATE 100 MG PO TABS: ORAL_TABLET | ORAL | 3 refills | Status: DC

## 2016-07-08 NOTE — Patient Instructions (Addendum)
Gabapentin to 300mg  three times  Daily refilled Continue Imitrex at current dose , refilled Continue Phenergan  Prn refilled F/U  Yearly

## 2016-07-08 NOTE — Progress Notes (Signed)
[facility]  PATIENT: Christina [name] DOB: [birth year]   REASON FOR VISIT: Follow-up for headache/migraine HISTORY FROM: Patient    HISTORY OF PRESENT ILLNESS:UPDATE 07/08/16 CMMs. Christina [name], 65 year old female returns for yearly followup.  She has a history of headaches which are well controlled with gabapentin.She is taking Gapentin 900mg  daily for headaches. She takes Imitrex acutely which is beneficial . She is occasionally nauseated with headache and takes Phenergan. She is currently doing well. She returns for reevaluation. No new neurologic complaints History: Patient has had headaches dating back to age 44, initially occurring over the right eye associated with nausea and vomiting she did not seek medical attention at that time. In her 52s she began to have daily headaches in the back of the head associated with nausea she had no visual symptoms photophobia or phonophobia with these headaches, amitriptyline controlled her symptoms until she started having palpitations. She has also been on Topamax with paresthesias   REVIEW OF SYSTEMS: Full 14 system review of systems performed and notable only for those listed, all others are neg:  Constitutional: neg  Cardiovascular: Palpitations Ear/Nose/Throat: neg  Skin: neg Eyes: neg Respiratory: neg Gastroitestinal: neg  Hematology/Lymphatic: neg  Endocrine: neg Musculoskeletal:neg Allergy/Immunology: neg Neurological: Headache Psychiatric: neg Sleep : neg   ALLERGIES: Allergies  Allergen Reactions  . Oxycontin [Oxycodone Hcl] Nausea Only    HOME MEDICATIONS: Outpatient Medications Prior to Visit  Medication Sig Dispense Refill  . atenolol (TENORMIN) 25 MG tablet Take 75 mg by mouth daily.     Marland Kitchen ELIQUIS 5 MG TABS tablet Take 5 mg by mouth 2 (two) times daily.     Marland Kitchen gabapentin (NEURONTIN) 300 MG capsule Take 1 capsule by mouth 3  times daily 270 capsule 3  . MATZIM LA 180 MG 24 hr tablet Take 180 mg by  mouth daily.     . multivitamin-lutein (OCUVITE-LUTEIN) CAPS Take 1 capsule by mouth daily.    . promethazine (PHENERGAN) 25 MG tablet TAKE 1 TABLET (25 MG TOTAL) BY MOUTH EVERY 8 (EIGHT) HOURS AS NEEDED FOR NAUSEA. 30 tablet 2  . SUMAtriptan (IMITREX) 100 MG tablet TAKE 1 TABLET (100 MG TOTAL) BY MOUTH EVERY 2 (TWO) HOURS AS NEEDED. 10 tablet 11  . vitamin C (ASCORBIC ACID) 500 MG tablet Take 500 mg by mouth daily.    Marland Kitchen PREMARIN vaginal cream      No facility-administered medications prior to visit.     PAST MEDICAL HISTORY: Past Medical History:  Diagnosis Date  . Dysrhythmia    saw Dr. Wynonia Lawman - no tx needed  . PONV (postoperative nausea and vomiting)     PAST SURGICAL HISTORY: Past Surgical History:  Procedure Laterality Date  . ACHILLES TENDON SURGERY     left  . FINGER SURGERY     left middle finger - pin placed  . HIP SURGERY     4 right hip surgeries  . LAPAROSCOPIC APPENDECTOMY  05/07/2012   Procedure: APPENDECTOMY LAPAROSCOPIC;  Surgeon: Pedro Earls, MD;  Location: [facility];  Service: General;  Laterality: N/A;  . TUBAL LIGATION      FAMILY HISTORY: Family History  Problem Relation Age of Onset  . Hypertension Mother   . Hypertension Sister   . Diabetes Sister     SOCIAL HISTORY: Social History   Social History  . Marital status: Married    Spouse name: [name]   . Number of children: 2  . Years of education: 12+   Occupational History  .  Retired     Social History Main Topics  . Smoking status: Never Smoker  . Smokeless tobacco: Never Used  . Alcohol use Yes     Comment: socially  . Drug use: No  . Sexual activity: Not on file   Other Topics Concern  . Not on file   Social History Narrative   Patient lives at home Husband [name].    Patient has 2 children.    Patient is right handed.    Patient has a 12+ years of education.    Patient is retired.      PHYSICAL EXAM  Vitals:   07/08/16 0951  BP: (!) 104/57  Pulse: (!) 52  Weight:  141 lb (64 kg)  Height: 5\' 3"  (1.6 m)   Body mass index is 24.98 kg/m. General: well developed, well nourished, seated, in no evident distress  Neurologic Exam  Mental Status: Awake and fully alert. Oriented to place and time. Follows all commands. Speech and language normal.  Cranial Nerves: Pupils equal, briskly reactive to light. Extraocular movements full without nystagmus. Visual fields full to confrontation. Hearing intact and symmetric to finger snap. Facial sensation intact. Face, tongue, palate move normally and symmetrically. Neck flexion and extension normal.  Motor: Normal bulk and tone. Normal strength in all tested extremity muscles.No focal weakness  Coordination: Rapid alternating movements normal in all extremities. Finger-to-nose and heel-to-shin performed accurately bilaterally. No dysmetria  Gait and Station: Arises from chair without difficulty. Stance is normal. Gait demonstrates normal stride length and balance . Able to heel, toe and tandem walk without difficulty.  Reflexes: 2+ and symmetric except for slightly diminished in the right knee and right ankle. Toes downgoing.    DIAGNOSTIC DATA (LABS, IMAGING, TESTING) -  ASSESSMENT AND PLAN  65 y.o. year old female  has a past medical history of headaches. Her headaches are currently controlled on gabapentin and Imitrex.    Gabapentin to 300mg  three times  daily Continue Imitrex at current dose  Continue Phenergan  prn F/U  Yearly Dennie Bible, [facility], [facility], APRN  [facility] [address] [place], [place] [address] [phone]

## 2016-07-22 NOTE — Progress Notes (Signed)
I reviewed note and agree with plan.   Najma Bozarth R. Sebastiano Luecke, MD  Certified in Neurology, Neurophysiology and Neuroimaging  [facility] [address], [address] [place], [place] [address] [phone]   

## 2016-08-15 DIAGNOSIS — L821 Other seborrheic keratosis: Secondary | ICD-10-CM | POA: Diagnosis not present

## 2016-08-15 DIAGNOSIS — Z23 Encounter for immunization: Secondary | ICD-10-CM | POA: Diagnosis not present

## 2016-09-13 DIAGNOSIS — N390 Urinary tract infection, site not specified: Secondary | ICD-10-CM | POA: Diagnosis not present

## 2016-09-13 DIAGNOSIS — R300 Dysuria: Secondary | ICD-10-CM | POA: Diagnosis not present

## 2016-09-30 ENCOUNTER — Other Ambulatory Visit: Payer: Self-pay | Admitting: Obstetrics and Gynecology

## 2016-09-30 DIAGNOSIS — Z1231 Encounter for screening mammogram for malignant neoplasm of breast: Secondary | ICD-10-CM | POA: Diagnosis not present

## 2016-09-30 DIAGNOSIS — Z124 Encounter for screening for malignant neoplasm of cervix: Secondary | ICD-10-CM | POA: Diagnosis not present

## 2016-10-01 LAB — CYTOLOGY - PAP

## 2016-10-07 ENCOUNTER — Other Ambulatory Visit: Payer: Self-pay | Admitting: Obstetrics and Gynecology

## 2016-10-07 DIAGNOSIS — M810 Age-related osteoporosis without current pathological fracture: Secondary | ICD-10-CM

## 2016-10-14 ENCOUNTER — Ambulatory Visit
Admission: RE | Admit: 2016-10-14 | Discharge: 2016-10-14 | Disposition: A | Discharge status: Home / Self Care | Payer: Medicare Other | Source: Ambulatory Visit | Attending: Obstetrics and Gynecology | Admitting: Obstetrics and Gynecology

## 2016-10-14 DIAGNOSIS — Z780 Asymptomatic menopausal state: Secondary | ICD-10-CM | POA: Diagnosis not present

## 2016-10-14 DIAGNOSIS — M810 Age-related osteoporosis without current pathological fracture: Secondary | ICD-10-CM | POA: Diagnosis not present

## 2016-12-02 DIAGNOSIS — Z7901 Long term (current) use of anticoagulants: Secondary | ICD-10-CM | POA: Diagnosis not present

## 2016-12-02 DIAGNOSIS — I480 Paroxysmal atrial fibrillation: Secondary | ICD-10-CM | POA: Diagnosis not present

## 2016-12-02 DIAGNOSIS — I491 Atrial premature depolarization: Secondary | ICD-10-CM | POA: Diagnosis not present

## 2016-12-02 DIAGNOSIS — I493 Ventricular premature depolarization: Secondary | ICD-10-CM | POA: Diagnosis not present

## 2016-12-04 DIAGNOSIS — H547 Unspecified visual loss: Secondary | ICD-10-CM | POA: Diagnosis not present

## 2016-12-04 DIAGNOSIS — H3530 Unspecified macular degeneration: Secondary | ICD-10-CM | POA: Diagnosis not present

## 2017-01-12 DIAGNOSIS — I480 Paroxysmal atrial fibrillation: Secondary | ICD-10-CM | POA: Diagnosis not present

## 2017-01-12 DIAGNOSIS — R0602 Shortness of breath: Secondary | ICD-10-CM | POA: Diagnosis not present

## 2017-01-12 DIAGNOSIS — I491 Atrial premature depolarization: Secondary | ICD-10-CM | POA: Diagnosis not present

## 2017-01-12 DIAGNOSIS — R002 Palpitations: Secondary | ICD-10-CM | POA: Diagnosis not present

## 2017-01-12 DIAGNOSIS — Z7901 Long term (current) use of anticoagulants: Secondary | ICD-10-CM | POA: Diagnosis not present

## 2017-01-12 DIAGNOSIS — I493 Ventricular premature depolarization: Secondary | ICD-10-CM | POA: Diagnosis not present

## 2017-01-13 DIAGNOSIS — I493 Ventricular premature depolarization: Secondary | ICD-10-CM | POA: Diagnosis not present

## 2017-01-13 DIAGNOSIS — Z7901 Long term (current) use of anticoagulants: Secondary | ICD-10-CM | POA: Diagnosis not present

## 2017-01-13 DIAGNOSIS — R0602 Shortness of breath: Secondary | ICD-10-CM | POA: Diagnosis not present

## 2017-01-13 DIAGNOSIS — I491 Atrial premature depolarization: Secondary | ICD-10-CM | POA: Diagnosis not present

## 2017-01-13 DIAGNOSIS — R002 Palpitations: Secondary | ICD-10-CM | POA: Diagnosis not present

## 2017-01-13 DIAGNOSIS — I480 Paroxysmal atrial fibrillation: Secondary | ICD-10-CM | POA: Diagnosis not present

## 2017-01-16 DIAGNOSIS — R008 Other abnormalities of heart beat: Secondary | ICD-10-CM | POA: Diagnosis not present

## 2017-01-16 DIAGNOSIS — R0602 Shortness of breath: Secondary | ICD-10-CM | POA: Diagnosis not present

## 2017-01-27 ENCOUNTER — Telehealth: Payer: Self-pay | Admitting: Cardiology

## 2017-01-27 NOTE — Telephone Encounter (Signed)
Called by [employer] concerning patient being in rapid afib.  Apparently patient has been in atrial fibrillation with average HR 130's since last month.  Strips have been sent to Dr. Wynonia Lawman daily.  Tonight noted to have autotrigger with a HR of 170bpm.  Tried to call patient but unable to get in touch with her.  Left message for her to call.

## 2017-02-04 DIAGNOSIS — M810 Age-related osteoporosis without current pathological fracture: Secondary | ICD-10-CM | POA: Diagnosis not present

## 2017-02-04 DIAGNOSIS — R938 Abnormal findings on diagnostic imaging of other specified body structures: Secondary | ICD-10-CM | POA: Diagnosis not present

## 2017-02-10 DIAGNOSIS — E559 Vitamin D deficiency, unspecified: Secondary | ICD-10-CM | POA: Insufficient documentation

## 2017-02-10 DIAGNOSIS — Z7901 Long term (current) use of anticoagulants: Secondary | ICD-10-CM | POA: Diagnosis not present

## 2017-02-10 DIAGNOSIS — M810 Age-related osteoporosis without current pathological fracture: Secondary | ICD-10-CM | POA: Insufficient documentation

## 2017-02-10 DIAGNOSIS — R0602 Shortness of breath: Secondary | ICD-10-CM | POA: Diagnosis not present

## 2017-02-10 DIAGNOSIS — R008 Other abnormalities of heart beat: Secondary | ICD-10-CM | POA: Diagnosis not present

## 2017-02-10 DIAGNOSIS — R002 Palpitations: Secondary | ICD-10-CM | POA: Diagnosis not present

## 2017-02-10 DIAGNOSIS — I480 Paroxysmal atrial fibrillation: Secondary | ICD-10-CM | POA: Diagnosis not present

## 2017-02-10 DIAGNOSIS — I493 Ventricular premature depolarization: Secondary | ICD-10-CM | POA: Diagnosis not present

## 2017-02-10 DIAGNOSIS — I491 Atrial premature depolarization: Secondary | ICD-10-CM | POA: Diagnosis not present

## 2017-02-24 DIAGNOSIS — I480 Paroxysmal atrial fibrillation: Secondary | ICD-10-CM | POA: Diagnosis not present

## 2017-02-24 DIAGNOSIS — I493 Ventricular premature depolarization: Secondary | ICD-10-CM | POA: Diagnosis not present

## 2017-02-24 DIAGNOSIS — I491 Atrial premature depolarization: Secondary | ICD-10-CM | POA: Diagnosis not present

## 2017-02-24 DIAGNOSIS — Z7901 Long term (current) use of anticoagulants: Secondary | ICD-10-CM | POA: Diagnosis not present

## 2017-02-24 DIAGNOSIS — R008 Other abnormalities of heart beat: Secondary | ICD-10-CM | POA: Diagnosis not present

## 2017-02-25 ENCOUNTER — Other Ambulatory Visit (HOSPITAL_BASED_OUTPATIENT_CLINIC_OR_DEPARTMENT_OTHER): Payer: Self-pay

## 2017-02-25 DIAGNOSIS — I480 Paroxysmal atrial fibrillation: Secondary | ICD-10-CM

## 2017-02-25 DIAGNOSIS — R0683 Snoring: Secondary | ICD-10-CM

## 2017-03-18 DIAGNOSIS — I480 Paroxysmal atrial fibrillation: Secondary | ICD-10-CM | POA: Diagnosis not present

## 2017-04-16 ENCOUNTER — Ambulatory Visit (HOSPITAL_BASED_OUTPATIENT_CLINIC_OR_DEPARTMENT_OTHER): Payer: Medicare Other | Attending: Cardiology | Admitting: Internal Medicine

## 2017-04-16 DIAGNOSIS — R0683 Snoring: Secondary | ICD-10-CM | POA: Diagnosis not present

## 2017-04-16 DIAGNOSIS — I480 Paroxysmal atrial fibrillation: Secondary | ICD-10-CM | POA: Insufficient documentation

## 2017-04-16 DIAGNOSIS — G4700 Insomnia, unspecified: Secondary | ICD-10-CM | POA: Insufficient documentation

## 2017-04-20 DIAGNOSIS — R0683 Snoring: Secondary | ICD-10-CM | POA: Diagnosis not present

## 2017-04-20 NOTE — Procedures (Signed)
  Patient Name: Christina [name], Christina [name] Date: 04/16/2017 Gender: Female D.O.B: 1951/01/11 Age (years): 65 Referring Provider: Landry Corporal Height (inches): 63 Interpreting Physician: Baird Lyons MD, ABSM Weight (lbs): 137 RPSGT: Jonna Coup BMI: 24 MRN: [id number] Neck Size: 13.50 CLINICAL INFORMATION Sleep Study Type: NPSG  Indication for sleep study: Fatigue, Morning Headaches, Snoring  Epworth Sleepiness Score: 3  SLEEP STUDY TECHNIQUE As per the AASM Manual for the Scoring of Sleep and Associated Events v2.3 (April 2016) with a hypopnea requiring 4% desaturations.  The channels recorded and monitored were frontal, central and occipital EEG, electrooculogram (EOG), submentalis EMG (chin), nasal and oral airflow, thoracic and abdominal wall motion, anterior tibialis EMG, snore microphone, electrocardiogram, and pulse oximetry.  MEDICATIONS Medications self-administered by patient taken the night of the study : atenolol, gabapentin, flecainide  SLEEP ARCHITECTURE The study was initiated at 9:42:53 PM and ended at 4:34:00 AM.  Sleep onset time was 83.8 minutes and the sleep efficiency was 52.9%. The total sleep time was 217.5 minutes.  Stage REM latency was 65.0 minutes.  The patient spent 2.30% of the night in stage N1 sleep, 54.94% in stage N2 sleep, 1.15% in stage N3 and 41.61% in REM.  Alpha intrusion was absent.  Supine sleep was 2.43%.  RESPIRATORY PARAMETERS The overall apnea/hypopnea index (AHI) was 1.1 per hour. There were 1 total apneas, including 1 obstructive, 0 central and 0 mixed apneas. There were 3 hypopneas and 5 RERAs.  The AHI during Stage REM sleep was 2.7 per hour.  AHI while supine was 0.0 per hour.  The mean oxygen saturation was 96.57%. The minimum SpO2 during sleep was 92.00%.  Moderate snoring was noted during this study.  CARDIAC DATA The 2 lead EKG demonstrated sinus rhythm. The mean heart rate was 53.05 beats per minute.  Other EKG findings include: PACs, PVCs.  LEG MOVEMENT DATA The total PLMS were 0 with a resulting PLMS index of 0.00. Associated arousal with leg movement index was 0.0 .  IMPRESSIONS - No significant obstructive sleep apnea occurred during this study (AHI = 1.1/h). - No significant central sleep apnea occurred during this study (CAI = 0.0/h). - Moderate oxygen desaturation was noted during this study (Min O2 = 92.00%). - The patient snored with Moderate snoring volume. - EKG findings include PACs, PVCs. - Clinically significant periodic limb movements did not occur during sleep. No significant associated arousals. - Patient awoke at 3:00 AM, unable to return to sleep.  DIAGNOSIS - Primary Snoring (786.09 [R06.83 ICD-10]) - Insomnia ( G47.00)  RECOMMENDATIONS - Be careful with alcohol, sedatives and other CNS depressants that may worsen sleep apnea and disrupt normal sleep architecture. - Sleep hygiene should be reviewed to assess factors that may improve sleep quality. - Weight management and regular exercise should be initiated or continued if appropriate.  [Electronically signed] 04/20/2017 10:47 AM  Baird Lyons MD, ABSM Diplomate, American Board of Sleep Medicine   NPI: [id number]  [place], American Board of Sleep Medicine  ELECTRONICALLY SIGNED ON:  04/20/2017, 10:40 AM [place] PH: [phone]   FX: [phone] [place]

## 2017-05-26 ENCOUNTER — Other Ambulatory Visit: Payer: Self-pay | Admitting: Nurse Practitioner

## 2017-05-29 ENCOUNTER — Telehealth: Payer: Self-pay | Admitting: Nurse Practitioner

## 2017-05-29 NOTE — Telephone Encounter (Signed)
Left message for pt. to call for earlier appt. with Hoyle Sauer.  She was last seen in Aug. 2017..  It is preferable that pt. be seen in order to try and identify a cause for worsening h/a's./fim

## 2017-05-29 NOTE — Telephone Encounter (Signed)
Patient called office in reference to increase in headaches going on for the past couple of months.  Patient is taking gabapentin and would like to know if maybe the medication needs to be increased.  Pharmacy-  [facility].  Please call

## 2017-06-03 DIAGNOSIS — H52223 Regular astigmatism, bilateral: Secondary | ICD-10-CM | POA: Diagnosis not present

## 2017-06-03 DIAGNOSIS — H353121 Nonexudative age-related macular degeneration, left eye, early dry stage: Secondary | ICD-10-CM | POA: Diagnosis not present

## 2017-06-03 DIAGNOSIS — H5203 Hypermetropia, bilateral: Secondary | ICD-10-CM | POA: Diagnosis not present

## 2017-06-03 DIAGNOSIS — H353111 Nonexudative age-related macular degeneration, right eye, early dry stage: Secondary | ICD-10-CM | POA: Diagnosis not present

## 2017-06-03 DIAGNOSIS — H43813 Vitreous degeneration, bilateral: Secondary | ICD-10-CM | POA: Diagnosis not present

## 2017-06-03 DIAGNOSIS — H25813 Combined forms of age-related cataract, bilateral: Secondary | ICD-10-CM | POA: Diagnosis not present

## 2017-06-03 DIAGNOSIS — H43393 Other vitreous opacities, bilateral: Secondary | ICD-10-CM | POA: Diagnosis not present

## 2017-06-16 DIAGNOSIS — I491 Atrial premature depolarization: Secondary | ICD-10-CM | POA: Diagnosis not present

## 2017-06-16 DIAGNOSIS — I480 Paroxysmal atrial fibrillation: Secondary | ICD-10-CM | POA: Diagnosis not present

## 2017-06-16 DIAGNOSIS — R008 Other abnormalities of heart beat: Secondary | ICD-10-CM | POA: Diagnosis not present

## 2017-06-16 DIAGNOSIS — I493 Ventricular premature depolarization: Secondary | ICD-10-CM | POA: Diagnosis not present

## 2017-06-16 DIAGNOSIS — Z7901 Long term (current) use of anticoagulants: Secondary | ICD-10-CM | POA: Diagnosis not present

## 2017-06-17 DIAGNOSIS — L821 Other seborrheic keratosis: Secondary | ICD-10-CM | POA: Diagnosis not present

## 2017-06-17 DIAGNOSIS — Z411 Encounter for cosmetic surgery: Secondary | ICD-10-CM | POA: Diagnosis not present

## 2017-06-17 DIAGNOSIS — Z85828 Personal history of other malignant neoplasm of skin: Secondary | ICD-10-CM | POA: Diagnosis not present

## 2017-06-17 DIAGNOSIS — D225 Melanocytic nevi of trunk: Secondary | ICD-10-CM | POA: Diagnosis not present

## 2017-06-20 DIAGNOSIS — H1033 Unspecified acute conjunctivitis, bilateral: Secondary | ICD-10-CM | POA: Diagnosis not present

## 2017-07-07 ENCOUNTER — Encounter: Payer: Self-pay | Admitting: Cardiology

## 2017-07-07 DIAGNOSIS — I493 Ventricular premature depolarization: Secondary | ICD-10-CM | POA: Diagnosis not present

## 2017-07-07 DIAGNOSIS — I480 Paroxysmal atrial fibrillation: Secondary | ICD-10-CM | POA: Diagnosis not present

## 2017-07-07 DIAGNOSIS — Z7901 Long term (current) use of anticoagulants: Secondary | ICD-10-CM | POA: Insufficient documentation

## 2017-07-07 DIAGNOSIS — I491 Atrial premature depolarization: Secondary | ICD-10-CM | POA: Diagnosis not present

## 2017-07-07 DIAGNOSIS — R008 Other abnormalities of heart beat: Secondary | ICD-10-CM | POA: Diagnosis not present

## 2017-07-07 HISTORY — DX: Long term (current) use of anticoagulants: Z79.01

## 2017-07-07 NOTE — Progress Notes (Signed)
Christina [name]  Date of visit:  07/07/2017 DOB:  [birth year]    Age:  66 yrs. Medical record number:  [id number]     Account number:  [id number] Primary Care Provider: [facility] ____________________________ CURRENT DIAGNOSES  1. Paroxysmal atrial fibrillation  2. Atrial premature depolarization  3. Long term (current) use of anticoagulants  4. Ventricular premature depolarization  5. Palpitations ____________________________ ALLERGIES  Hydrocodone, Nausea  Metoprolol, Nausea  Propranolol, Intolerance-unknown ____________________________ MEDICATIONS  1. alendronate 70 mg tablet, 150mg  once a month  2. atenolol 50 mg tablet, BID  3. calcium carbonate-vitamin D2 600 mg calcium- 200 unit capsule, 1 p.o. daily  4. diltiazem CD 180 mg capsule,extended release 24 hr, 1 tablet PO daily PRN  5. Eliquis 5 mg tablet, BID  6. flecainide 100 mg tablet, take 1 tablet in the morning and 1/2 tablet in the evening  7. gabapentin 300 mg capsule, QID  8. ICaps AREDS 14,320 unit-226 mg-200 unit capsule, BID  9. Imitrex 100 mg tablet, PRN  10. Migraine Formula 250 mg-250 mg-65 mg tablet, PRN  11. promethazine 25 mg tablet, PRN ____________________________ HISTORY OF PRESENT ILLNESS Patient seen early for evaluation of palpitations. She became severely short of breath last week and was noted to be in atrial fibrillation by her alive cor device. She felt relatively well over the weekend but again last night had the onset of palpitations and had atrial fibrillation again noted. She has been on beta blockers as well as flecainide but has had breakthrough arrhythmias despite taking these. She is quite limited by shortness of breath and her symptoms when she is in atrial fibrillation. She denies angina and has no PND, orthopnea or edema. ____________________________ PAST HISTORY  Past Medical Illnesses:  migraine headaches, denies hypertension or diabetes;  Cardiovascular Illnesses:  arrhythmia-PACs,  arrhythmia-PVCs, atrial fibrillation-paroxysmal;  Surgical Procedures:  hip replacement-rt, tubal ligation, l leg tendon surgery, appendectomy;  NYHA Classification:  I;  Canadian Angina Classification:  Class 0: Asymptomatic;  Cardiology Procedures-Invasive:  no history of prior cardiac procedures;  Cardiology Procedures-Noninvasive:  treadmill, holter monitor, echocardiogram June 2009, event monitor August 2015, echocardiogram October 2015, event monitor February 2018, echocardiogram 2018;  LVEF of 55% documented via echocardiogram on 09/04/2014,   CHA2DS2-VASC Score:  2 ____________________________ CARDIO-PULMONARY TEST DATES EKG Date:  06/16/2017;  Holter/Event Monitor Date: 01/12/2017;  Echocardiography Date: 01/16/2017;   ____________________________ FAMILY HISTORY Father -- Malignant neoplasm of lung, Father dead Mother -- Dementia/Alzheimer's, TIA, Mother dead Sister -- Diabetes mellitus, Hypertension ____________________________ SOCIAL HISTORY Alcohol Use:  socially;  Smoking:  nonsmoker;  Diet:  regular diet;  Lifestyle:  married;  Exercise:  walking;  Occupation:  Estate manager/land agent;  Residence:  lives with husband;   ____________________________ REVIEW OF SYSTEMS General:  malaise and fatigue Eyes: wears eye glasses/contact lenses Respiratory: dyspnea, some snoring Cardiovascular:  please review HPI Genitourinary-Female: no dysuria, urgency, frequency, UTIs, or stress incontinence Musculoskeletal:  denies arthritis, venous insufficiency, or muscle weakness Neurological:  occasional migraine headaches  ____________________________ PHYSICAL EXAMINATION VITAL SIGNS  Blood Pressure:  110/60 Retaken by MD   Pulse:  60/min. Weight:  136.00 lbs. Height:  63.00"BMI: 24  Constitutional:  pleasant white female, in no acute distress Skin:  warm and dry to touch, no apparent skin lesions, or masses noted. Head:  normocephalic, normal hair pattern, no masses or tenderness Neck:  supple,  without massess. No JVD, thyromegaly or carotid bruits. Carotid upstroke normal. Chest:  normal symmetry, clear to auscultation. Cardiac:  irregularly irregular rhythm, normal  S1 and S2, no S3 or S4 Peripheral Pulses:  the femoral,dorsalis pedis, and posterior tibial pulses are full and equal bilaterally with no bruits auscultated. Extremities & Back:  no deformities, clubbing, cyanosis, erythema or edema observed. Normal muscle strength and tone. Neurological:  no gross motor or sensory deficits noted, affect appropriate, oriented x3. ___________________________ IMPRESSIONS/PLAN  1. Recurrent paroxysmal atrial fibrillation despite flecainide therapy 2. Long-term use of anticoagulation without complications  Recommendations:  Her rhythm strip shows her to be in atrial fibrillation with somewhat rapid response. She has had breakthrough atrial arrhythmias despite medical therapy. I would like her to talk to electrophysiologist about consideration of ablation or other therapies at her age. She does have a lot of PACs. She will followup after she has had a chance to talk to electrophysiologist and in the meantime is to increase her flecainide to twice daily. ____________________________ TODAYS ORDERS  1.  Consult Dr. Rayann Heman                       ____________________________ Cardiology Physician:  Kerry Hough MD [facility]

## 2017-07-08 ENCOUNTER — Ambulatory Visit: Payer: Medicare Other | Admitting: Nurse Practitioner

## 2017-07-20 NOTE — Progress Notes (Signed)
[facility]  PATIENT: Christina [name] DOB: February 13, 1951   REASON FOR VISIT: Follow-up for headache/migraine HISTORY FROM: Patient    HISTORY OF PRESENT ILLNESS:UPDATE 08/28/2018CM Ms. Christina [name], 66 year old female returns for follow-up with history of migraine headaches which are currently well controlled on gabapentin. She takes Imitrex acutely which is beneficial. She is occasionally nauseated with her headaches and takes Phenergan when necessary. She is due for a cardiac ablation in a couple of weeks for atrial fibrillation. No other interval medical issues. She returns for reevaluation   UPDATE 07/08/16 CMMs. Christina [name], 66 year old female returns for yearly followup.  She has a history of headaches which are well controlled with gabapentin.She is taking Gapentin 900mg  daily for headaches. She takes Imitrex acutely which is beneficial . She is occasionally nauseated with headache and takes Phenergan. She is currently doing well. She returns for reevaluation. No new neurologic complaints History: Patient has had headaches dating back to age 68, initially occurring over the right eye associated with nausea and vomiting she did not seek medical attention at that time. In her 14s she began to have daily headaches in the back of the head associated with nausea she had no visual symptoms photophobia or phonophobia with these headaches, amitriptyline controlled her symptoms until she started having palpitations. She has also been on Topamax with paresthesias   REVIEW OF SYSTEMS: Full 14 system review of systems performed and notable only for those listed, all others are neg:  Constitutional: neg  Cardiovascular: Palpitations Ear/Nose/Throat: neg  Skin: neg Eyes: neg Respiratory: neg Gastroitestinal: neg  Hematology/Lymphatic: neg  Endocrine: neg Musculoskeletal:neg Allergy/Immunology: neg Neurological: Headache Psychiatric: neg Sleep : neg   ALLERGIES: Allergies    Allergen Reactions  . Oxycontin [Oxycodone Hcl] Nausea Only  . Metoprolol Nausea Only  . Propranolol Nausea Only    HOME MEDICATIONS: Outpatient Medications Prior to Visit  Medication Sig Dispense Refill  . atenolol (TENORMIN) 25 MG tablet Take 75 mg by mouth daily.     Marland Kitchen ELIQUIS 5 MG TABS tablet Take 5 mg by mouth 2 (two) times daily.     Marland Kitchen gabapentin (NEURONTIN) 300 MG capsule TAKE 1 CAPSULE BY MOUTH 3  TIMES DAILY 270 capsule 3  . MATZIM LA 180 MG 24 hr tablet Take 180 mg by mouth daily.     . multivitamin-lutein (OCUVITE-LUTEIN) CAPS Take 1 capsule by mouth daily.    . promethazine (PHENERGAN) 25 MG tablet TAKE 1 TABLET (25 MG TOTAL) BY MOUTH EVERY 8 (EIGHT) HOURS AS NEEDED FOR NAUSEA. 30 tablet 3  . SUMAtriptan (IMITREX) 100 MG tablet TAKE 1 TABLET (100 MG TOTAL) BY MOUTH EVERY 2 (TWO) HOURS AS NEEDED. 30 tablet 3  . vitamin C (ASCORBIC ACID) 500 MG tablet Take 500 mg by mouth daily.     No facility-administered medications prior to visit.     PAST MEDICAL HISTORY: Past Medical History:  Diagnosis Date  . Paroxysmal atrial fibrillation ([place]) 09/04/2014   Dr. Wynonia Lawman. Started eliquis 08/2014     PAST SURGICAL HISTORY: Past Surgical History:  Procedure Laterality Date  . ACHILLES TENDON SURGERY     left  . FINGER SURGERY     left middle finger - pin placed  . HIP SURGERY     4 right hip surgeries  . LAPAROSCOPIC APPENDECTOMY  05/07/2012   Procedure: APPENDECTOMY LAPAROSCOPIC;  Surgeon: Pedro Earls, MD;  Location: [facility];  Service: General;  Laterality: N/A;  . TUBAL LIGATION      FAMILY HISTORY:  Family History  Problem Relation Age of Onset  . Hypertension Mother   . Hypertension Sister   . Diabetes Sister     SOCIAL HISTORY: Social History   Social History  . Marital status: Married    Spouse name: [name]   . Number of children: 2  . Years of education: 12+   Occupational History  . Retired     Social History Main Topics  . Smoking status: Never  Smoker  . Smokeless tobacco: Never Used  . Alcohol use Yes     Comment: socially  . Drug use: No  . Sexual activity: Not on file   Other Topics Concern  . Not on file   Social History Narrative   Patient lives at home Husband [name].    Patient has 2 children.    Patient is right handed.    Patient has a 12+ years of education.    Patient is retired.      PHYSICAL EXAM  Vitals:   07/21/17 0742  BP: (!) 106/59  Pulse: (!) 56  Weight: 140 lb (63.5 kg)  Height: 5\' 3"  (1.6 m)   Body mass index is 24.8 kg/m. General: well developed, well nourished, seated, in no evident distress  Neurologic Exam  Mental Status: Awake and fully alert. Oriented to place and time. Follows all commands. Speech and language normal.  Cranial Nerves: Pupils equal, briskly reactive to light. Extraocular movements full without nystagmus. Visual fields full to confrontation. Hearing intact and symmetric to finger snap. Facial sensation intact. Face, tongue, palate move normally and symmetrically. Neck flexion and extension normal.  Motor: Normal bulk and tone. Normal strength in all tested extremity muscles.No focal weakness  Coordination: Rapid alternating movements normal in all extremities. Finger-to-nose and heel-to-shin performed accurately bilaterally. No dysmetria  Gait and Station: Arises from chair without difficulty. Stance is normal. Gait demonstrates normal stride length and balance . Able to heel, toe and tandem walk without difficulty.  Reflexes: 2+ and symmetric except for slightly diminished in the right knee and right ankle. Toes downgoing.    DIAGNOSTIC DATA (LABS, IMAGING, TESTING) -  ASSESSMENT AND PLAN  66 y.o. year old female  has a past medical history of headaches. Her headaches are currently controlled on gabapentin and Imitrex.    Gabapentin to 300mg  three times  Daily will refill Continue Imitrex at current dose will refill  Continue Phenergan  prn F/U   Yearly Dennie Bible, [facility], [facility], APRN  [facility] [address] [place], [place] [address] [phone]

## 2017-07-21 ENCOUNTER — Encounter: Payer: Self-pay | Admitting: Nurse Practitioner

## 2017-07-21 ENCOUNTER — Ambulatory Visit (INDEPENDENT_AMBULATORY_CARE_PROVIDER_SITE_OTHER): Payer: Medicare Other | Admitting: Nurse Practitioner

## 2017-07-21 VITALS — BP 106/59 | HR 56 | Ht 63.0 in | Wt 140.0 lb

## 2017-07-21 DIAGNOSIS — G43009 Migraine without aura, not intractable, without status migrainosus: Secondary | ICD-10-CM | POA: Diagnosis not present

## 2017-07-21 MED ORDER — SUMATRIPTAN SUCCINATE 100 MG PO TABS: ORAL_TABLET | ORAL | 3 refills | Status: DC

## 2017-07-21 MED ORDER — GABAPENTIN 300 MG PO CAPS: 300.0000 mg | ORAL_CAPSULE | Freq: Three times a day (TID) | ORAL | 3 refills | Status: DC

## 2017-07-21 NOTE — Patient Instructions (Signed)
Gabapentin to 300mg  three times  Daily will refill Continue Imitrex at current dose will refill  Continue Phenergan  prn F/U  Yearly

## 2017-07-29 ENCOUNTER — Encounter: Payer: Self-pay | Admitting: Internal Medicine

## 2017-07-29 ENCOUNTER — Ambulatory Visit (INDEPENDENT_AMBULATORY_CARE_PROVIDER_SITE_OTHER): Payer: Medicare Other | Admitting: Internal Medicine

## 2017-07-29 VITALS — BP 120/80 | HR 50 | Ht 63.0 in | Wt 139.4 lb

## 2017-07-29 DIAGNOSIS — I480 Paroxysmal atrial fibrillation: Secondary | ICD-10-CM | POA: Diagnosis not present

## 2017-07-29 DIAGNOSIS — R0602 Shortness of breath: Secondary | ICD-10-CM | POA: Diagnosis not present

## 2017-07-29 LAB — CBC WITH DIFFERENTIAL/PLATELET
Basophils Absolute: 0.1 10*3/uL (ref 0.0–0.2)
Basos: 1 %
EOS (ABSOLUTE): 0.2 10*3/uL (ref 0.0–0.4)
Eos: 2 %
Hematocrit: 39.2 % (ref 34.0–46.6)
Hemoglobin: 13.1 g/dL (ref 11.1–15.9)
Immature Grans (Abs): 0 10*3/uL (ref 0.0–0.1)
Immature Granulocytes: 0 %
Lymphocytes Absolute: 2.5 10*3/uL (ref 0.7–3.1)
Lymphs: 32 %
MCH: 30 pg (ref 26.6–33.0)
MCHC: 33.4 g/dL (ref 31.5–35.7)
MCV: 90 fL (ref 79–97)
Monocytes Absolute: 0.7 10*3/uL (ref 0.1–0.9)
Monocytes: 9 %
Neutrophils Absolute: 4.3 10*3/uL (ref 1.4–7.0)
Neutrophils: 56 %
Platelets: 242 10*3/uL (ref 150–379)
RBC: 4.36 x10E6/uL (ref 3.77–5.28)
RDW: 13.7 % (ref 12.3–15.4)
WBC: 7.7 10*3/uL (ref 3.4–10.8)

## 2017-07-29 LAB — BASIC METABOLIC PANEL
BUN/Creatinine Ratio: 19 (ref 12–28)
BUN: 17 mg/dL (ref 8–27)
CO2: 24 mmol/L (ref 20–29)
Calcium: 10 mg/dL (ref 8.7–10.3)
Chloride: 103 mmol/L (ref 96–106)
Creatinine, Ser: 0.89 mg/dL (ref 0.57–1.00)
GFR calc Af Amer: 77 mL/min/{1.73_m2} (ref >59)
GFR calc non Af Amer: 67 mL/min/{1.73_m2} (ref >59)
Glucose: 86 mg/dL (ref 65–99)
Potassium: 4.6 mmol/L (ref 3.5–5.2)
Sodium: 143 mmol/L (ref 134–144)

## 2017-07-29 NOTE — Progress Notes (Signed)
Electrophysiology Office Note   Date:  07/29/2017   ID:  Christina [name], DOB 07/07/1951, MRN [id number]  PCP:  Leighton Ruff, MD  Cardiologist:  Dr Wynonia Lawman Primary Electrophysiologist: Thompson Grayer, MD    CC: afib   History of Present Illness: Christina [name] is a 66 y.o. female who presents today for electrophysiology evaluation.   She is referred by Dr Wynonia Lawman for EP consultation regarding afib management.  She reports having afib for "several years".  She is unaware of triggers/ precipitants.  She reports symptoms of tachypalpitations, SOB, and fatigue with afib.  She has had increasing frequency and duration of her afib.  She has failed medical therapy with atenolol, diltiazem, and flecainide.  Recent increases in flecainide to 100mg  BID have improved afib however she feels that her SOB is worse.  She is anticoagulated with eliquis without bleeding issues.  Today, she denies symptoms of chest pain, orthopnea, PND, lower extremity edema, claudication, dizziness, presyncope, syncope,  or neurologic sequela. The patient is tolerating medications without difficulties and is otherwise without complaint today.    Past Medical History:  Diagnosis Date  . Appendicitis, acute 06/01/2012  . Current use of long term anticoagulation 07/07/2017  . Palpitations 08/30/2014   Seen by Dr. Tollie Eth, had a holter 08/2014   . Paroxysmal atrial fibrillation ([place]) 09/04/2014   Dr. Wynonia Lawman. Started eliquis 08/2014    Past Surgical History:  Procedure Laterality Date  . ACHILLES TENDON SURGERY     left  . FINGER SURGERY     left middle finger - pin placed  . HIP SURGERY     4 right hip surgeries  . LAPAROSCOPIC APPENDECTOMY  05/07/2012   Procedure: APPENDECTOMY LAPAROSCOPIC;  Surgeon: Pedro Earls, MD;  Location: [facility];  Service: General;  Laterality: N/A;  . TUBAL LIGATION       Current Outpatient Prescriptions  Medication Sig Dispense Refill  . atenolol (TENORMIN) 25 MG tablet  Take 75 mg by mouth daily.     . calcium-vitamin D 250-100 MG-UNIT tablet Take 2 tablets by mouth 2 (two) times daily.    Marland Kitchen ELIQUIS 5 MG TABS tablet Take 5 mg by mouth 2 (two) times daily.     . flecainide (TAMBOCOR) 100 MG tablet Take 100 mg by mouth 2 (two) times daily.    Marland Kitchen gabapentin (NEURONTIN) 300 MG capsule Take 1 capsule (300 mg total) by mouth 3 (three) times daily. 270 capsule 3  . ibandronate (BONIVA) 150 MG tablet Take 1 tablet by mouth every 30 (thirty) days.    Marland Kitchen MATZIM LA 180 MG 24 hr tablet Take 180 mg by mouth daily.     . multivitamin-lutein (OCUVITE-LUTEIN) CAPS Take 1 capsule by mouth daily.    . promethazine (PHENERGAN) 25 MG tablet TAKE 1 TABLET (25 MG TOTAL) BY MOUTH EVERY 8 (EIGHT) HOURS AS NEEDED FOR NAUSEA. 30 tablet 3  . SUMAtriptan (IMITREX) 100 MG tablet TAKE 1 TABLET (100 MG TOTAL) BY MOUTH EVERY 2 (TWO) HOURS AS NEEDED. 10 tablet 3   No current facility-administered medications for this visit.     Allergies:   Oxycontin [oxycodone hcl]; Metoprolol; and Propranolol   Social History:  The patient  reports that she has never smoked. She has never used smokeless tobacco. She reports that she drinks alcohol. She reports that she does not use drugs.   Family History:  The patient's  family history includes Diabetes in her sister; Hypertension in her mother and sister.  ROS:  Please see the history of present illness.   All other systems are personally reviewed and negative.    PHYSICAL EXAM: VS:  BP 120/80   Pulse (!) 50   Ht 5\' 3"  (1.6 m)   Wt 139 lb 6.4 oz (63.2 kg)   SpO2 97%   BMI 24.69 kg/m  , BMI Body mass index is 24.69 kg/m. GEN: Well nourished, well developed, in no acute distress  HEENT: normal  Neck: no JVD, carotid bruits, or masses Cardiac: RRR; no murmurs, rubs, or gallops,no edema  Respiratory:  clear to auscultation bilaterally, normal work of breathing GI: soft, nontender, nondistended, + BS MS: no deformity or atrophy  Skin: warm  and dry  Neuro:  Strength and sensation are intact Psych: euthymic mood, full affect  EKG:  EKG is ordered today. The ekg ordered today is personally reviewed and shows sinus rhythm 50 bpm, PR 162 msec, nonspecific St/T changes   Recent Labs: No results found for requested labs within last 8760 hours.  personally reviewed   Lipid Panel  No results found for: CHOL, TRIG, HDL, CHOLHDL, VLDL, LDLCALC, LDLDIRECT personally reviewed   Wt Readings from Last 3 Encounters:  07/29/17 139 lb 6.4 oz (63.2 kg)  07/21/17 140 lb (63.5 kg)  04/16/17 137 lb (62.1 kg)      Other studies personally reviewed: Additional studies/ records that were reviewed today include: Dr Ezekiel Slocumb records, echo 01/16/17, prior ETT, event monitor  Review of the above records today demonstrates: as above,  EF 70%, mildly enlarged LA, mild MR, mild to moderate TR   ASSESSMENT AND PLAN:  1.  Paroxysmal atrial fibrillation The patient has symptomatic paroxysmal atrial fibrillation.  She has failed medical therapy with atenolol, diltiazem, and flecainide. Therapeutic strategies for afib including medicine and ablation were discussed in detail with the patient today. Risk, benefits, and alternatives to EP study and radiofrequency ablation for afib were also discussed in detail today. These risks include but are not limited to stroke, bleeding, vascular damage, tamponade, perforation, damage to the esophagus, lungs, and other structures, pulmonary vein stenosis, worsening renal function, and death. The patient understands these risk and wishes to proceed.  We will therefore proceed with catheter ablation at the next available time.  Will plan cardiac CT prior to ablation.  Given SOB, I will ask that the study be protocoled to exclude CAD as well.  chads2vasc score is 2.  She is appropriately anticoagulated    Current medicines are reviewed at length with the patient today.   The patient does not have concerns regarding her  medicines.  The following changes were made today:  none  Labs/ tests ordered today include:  Orders Placed This Encounter  Procedures  . CT CORONARY MORPH W/CTA COR W/SCORE W/CA W/CM &/OR WO/CM  . CT CORONARY FRACTIONAL FLOW RESERVE DATA PREP  . CT CORONARY FRACTIONAL FLOW RESERVE FLUID ANALYSIS  . Basic metabolic panel  . CBC with Differential  . EKG 12-Lead     Signed, Thompson Grayer, MD  07/29/2017 10:18 AM     [facility] [address] [place] [address] [phone] (office) [phone] (fax)

## 2017-07-29 NOTE — Patient Instructions (Signed)
Medication Instructions:  Your physician recommends that you continue on your current medications as directed. Please refer to the Current Medication list given to you today.   Labwork: Your physician recommends that you return for lab work today: BMP/CBC   Testing/Procedures: Your physician has requested that you have cardiac CT. Cardiac computed tomography (CT) is a painless test that uses an x-ray machine to take clear, detailed pictures of your heart. For further information please visit [facility]. Please follow instruction sheet as given.  Week of 08/10/17 prior to the afib ablation on 08/18/17.  Office will call once approved by insurance  Your physician has recommended that you have an ablation. Catheter ablation is a medical procedure used to treat some cardiac arrhythmias (irregular heartbeats). During catheter ablation, a long, thin, flexible tube is put into a blood vessel in your groin (upper thigh), or neck. This tube is called an ablation catheter. It is then guided to your heart through the blood vessel. Radio frequency waves destroy small areas of heart tissue where abnormal heartbeats may cause an arrhythmia to start. Please see the instruction sheet given to you today.---08/18/17  Please arrive at [facility] at 5:30am Do not eat or drink after midnight the night prior to the procedure Do not take any medications the morning of the test Plan for one night stay Will need someone to drive you home at discharge    Follow-Up: Your physician recommends that you schedule a follow-up appointment in: 4 weeks from 08/18/17 with Roderic Palau, NP in [facility] and 3 months from 08/18/17 with Dr Rayann Heman   Thank you for choosing [facility]!!     Janan Halter, RN [phone]

## 2017-08-03 NOTE — Progress Notes (Signed)
I reviewed note and agree with plan.   Penni Bombard, MD 5/73/2256, 7:20 PM Certified in Neurology, Neurophysiology and Neuroimaging  [facility] [address] [place], [place] [address] [phone]

## 2017-08-10 ENCOUNTER — Encounter: Payer: Self-pay | Admitting: Internal Medicine

## 2017-08-12 ENCOUNTER — Ambulatory Visit (HOSPITAL_COMMUNITY)
Admission: RE | Admit: 2017-08-12 | Discharge: 2017-08-12 | Disposition: A | Discharge status: Home / Self Care | Payer: Medicare Other | Source: Ambulatory Visit | Attending: Internal Medicine | Admitting: Internal Medicine

## 2017-08-12 ENCOUNTER — Encounter (HOSPITAL_COMMUNITY): Payer: Self-pay

## 2017-08-12 DIAGNOSIS — R918 Other nonspecific abnormal finding of lung field: Secondary | ICD-10-CM | POA: Diagnosis not present

## 2017-08-12 DIAGNOSIS — I480 Paroxysmal atrial fibrillation: Secondary | ICD-10-CM

## 2017-08-12 DIAGNOSIS — I4891 Unspecified atrial fibrillation: Secondary | ICD-10-CM | POA: Diagnosis not present

## 2017-08-12 MED ORDER — NITROGLYCERIN 0.4 MG SL SUBL
0.4000 mg | SUBLINGUAL_TABLET | Freq: Once | SUBLINGUAL | Status: AC
  Administered 2017-08-12: 0.4 mg via SUBLINGUAL
  Filled 2017-08-12: qty 25

## 2017-08-12 MED ORDER — IOPAMIDOL (ISOVUE-370) INJECTION 76%
INTRAVENOUS | Status: AC
  Administered 2017-08-12: 80 mL
  Filled 2017-08-12: qty 100

## 2017-08-12 MED ORDER — NITROGLYCERIN 0.4 MG SL SUBL
SUBLINGUAL_TABLET | SUBLINGUAL | Status: AC
  Filled 2017-08-12: qty 1

## 2017-08-12 NOTE — Progress Notes (Signed)
Pt denies any SOB or pain.  NAD noted.  Pt reports she feels her heart "fluttering a little bit."

## 2017-08-18 ENCOUNTER — Ambulatory Visit (HOSPITAL_COMMUNITY): Payer: Medicare Other | Admitting: Anesthesiology

## 2017-08-18 ENCOUNTER — Ambulatory Visit (HOSPITAL_COMMUNITY)
Admission: RE | Admit: 2017-08-18 | Discharge: 2017-08-18 | Disposition: A | Discharge status: Home / Self Care | Payer: Medicare Other | Source: Ambulatory Visit | Attending: Internal Medicine | Admitting: Internal Medicine

## 2017-08-18 ENCOUNTER — Encounter (HOSPITAL_COMMUNITY): Payer: Self-pay | Admitting: Certified Registered Nurse Anesthetist

## 2017-08-18 ENCOUNTER — Encounter (HOSPITAL_COMMUNITY)
Admission: RE | Disposition: A | Discharge status: Home / Self Care | Payer: Self-pay | Source: Ambulatory Visit | Attending: Internal Medicine

## 2017-08-18 DIAGNOSIS — Z7901 Long term (current) use of anticoagulants: Secondary | ICD-10-CM | POA: Diagnosis not present

## 2017-08-18 DIAGNOSIS — I480 Paroxysmal atrial fibrillation: Secondary | ICD-10-CM | POA: Diagnosis not present

## 2017-08-18 DIAGNOSIS — I484 Atypical atrial flutter: Secondary | ICD-10-CM | POA: Insufficient documentation

## 2017-08-18 DIAGNOSIS — G43009 Migraine without aura, not intractable, without status migrainosus: Secondary | ICD-10-CM | POA: Diagnosis not present

## 2017-08-18 DIAGNOSIS — I4892 Unspecified atrial flutter: Secondary | ICD-10-CM | POA: Diagnosis not present

## 2017-08-18 HISTORY — PX: ATRIAL FIBRILLATION ABLATION: EP1191

## 2017-08-18 LAB — CBC
HCT: 40.3 % (ref 36.0–46.0)
Hemoglobin: 12.9 g/dL (ref 12.0–15.0)
MCH: 29.8 pg (ref 26.0–34.0)
MCHC: 32 g/dL (ref 30.0–36.0)
MCV: 93.1 fL (ref 78.0–100.0)
Platelets: 250 10*3/uL (ref 150–400)
RBC: 4.33 MIL/uL (ref 3.87–5.11)
RDW: 13.2 % (ref 11.5–15.5)
WBC: 6.9 10*3/uL (ref 4.0–10.5)

## 2017-08-18 LAB — BASIC METABOLIC PANEL
Anion gap: 8 (ref 5–15)
BUN: 12 mg/dL (ref 6–20)
CO2: 29 mmol/L (ref 22–32)
Calcium: 9.4 mg/dL (ref 8.9–10.3)
Chloride: 103 mmol/L (ref 101–111)
Creatinine, Ser: 1 mg/dL (ref 0.44–1.00)
GFR calc Af Amer: >60 mL/min (ref >60)
GFR calc non Af Amer: 57 mL/min — ABNORMAL LOW (ref >60)
Glucose, Bld: 94 mg/dL (ref 65–99)
Potassium: 4.3 mmol/L (ref 3.5–5.1)
Sodium: 140 mmol/L (ref 135–145)

## 2017-08-18 LAB — POCT ACTIVATED CLOTTING TIME
Activated Clotting Time: 301 seconds
Activated Clotting Time: 379 seconds
Activated Clotting Time: 202 seconds
Activated Clotting Time: 357 seconds
Activated Clotting Time: 197 seconds

## 2017-08-18 SURGERY — ATRIAL FIBRILLATION ABLATION
Anesthesia: General

## 2017-08-18 MED ORDER — PANTOPRAZOLE SODIUM 40 MG PO TBEC: 40.0000 mg | DELAYED_RELEASE_TABLET | Freq: Every day | ORAL | 0 refills | Status: DC

## 2017-08-18 MED ORDER — PROPOFOL 10 MG/ML IV BOLUS
INTRAVENOUS | Status: DC | PRN
  Administered 2017-08-18: 40 mg via INTRAVENOUS
  Administered 2017-08-18: 160 mg via INTRAVENOUS

## 2017-08-18 MED ORDER — MIDAZOLAM HCL 5 MG/5ML IJ SOLN
INTRAMUSCULAR | Status: DC | PRN
  Administered 2017-08-18: 2 mg via INTRAVENOUS

## 2017-08-18 MED ORDER — SODIUM CHLORIDE 0.9% FLUSH
3.0000 mL | Freq: Two times a day (BID) | INTRAVENOUS | Status: DC
  Administered 2017-08-18: 3 mL via INTRAVENOUS

## 2017-08-18 MED ORDER — LIDOCAINE HCL (PF) 1 % IJ SOLN
INTRAMUSCULAR | Status: DC | PRN
  Administered 2017-08-18: 25 mL via INTRADERMAL

## 2017-08-18 MED ORDER — BUPIVACAINE HCL (PF) 0.25 % IJ SOLN
INTRAMUSCULAR | Status: AC
Start: 2017-08-18 — End: 2017-08-18
  Filled 2017-08-18: qty 30

## 2017-08-18 MED ORDER — LIDOCAINE 2% (20 MG/ML) 5 ML SYRINGE
INTRAMUSCULAR | Status: DC | PRN
Start: 2017-08-18 — End: 2017-08-18
  Administered 2017-08-18: 100 mg via INTRAVENOUS

## 2017-08-18 MED ORDER — PHENYLEPHRINE HCL 10 MG/ML IJ SOLN
INTRAVENOUS | Status: DC | PRN
  Administered 2017-08-18: 25 ug/min via INTRAVENOUS

## 2017-08-18 MED ORDER — ISOPROTERENOL HCL 0.2 MG/ML IJ SOLN
INTRAVENOUS | Status: DC | PRN
  Administered 2017-08-18: 10 ug/min via INTRAVENOUS

## 2017-08-18 MED ORDER — IOPAMIDOL (ISOVUE-370) INJECTION 76%
INTRAVENOUS | Status: AC
  Filled 2017-08-18: qty 50

## 2017-08-18 MED ORDER — HEPARIN SODIUM (PORCINE) 1000 UNIT/ML IJ SOLN
INTRAMUSCULAR | Status: AC
  Filled 2017-08-18: qty 1

## 2017-08-18 MED ORDER — ISOPROTERENOL HCL 0.2 MG/ML IJ SOLN
INTRAMUSCULAR | Status: AC
  Filled 2017-08-18: qty 5

## 2017-08-18 MED ORDER — ACETAMINOPHEN 325 MG PO TABS
ORAL_TABLET | ORAL | Status: AC
  Filled 2017-08-18: qty 2

## 2017-08-18 MED ORDER — FENTANYL CITRATE (PF) 100 MCG/2ML IJ SOLN: 25.0000 ug | INTRAMUSCULAR | Status: DC | PRN

## 2017-08-18 MED ORDER — SODIUM CHLORIDE 0.9 % IV SOLN: 250.0000 mL | INTRAVENOUS | Status: DC | PRN

## 2017-08-18 MED ORDER — SODIUM CHLORIDE 0.9 % IV SOLN
INTRAVENOUS | Status: DC
  Administered 2017-08-18: 06:00:00 via INTRAVENOUS

## 2017-08-18 MED ORDER — FENTANYL CITRATE (PF) 100 MCG/2ML IJ SOLN
INTRAMUSCULAR | Status: DC | PRN
  Administered 2017-08-18 (×2): 25 ug via INTRAVENOUS

## 2017-08-18 MED ORDER — APIXABAN 5 MG PO TABS: 5.0000 mg | ORAL_TABLET | Freq: Two times a day (BID) | ORAL | Status: DC

## 2017-08-18 MED ORDER — INFLUENZA VAC SPLIT HIGH-DOSE 0.5 ML IM SUSY: 0.5000 mL | PREFILLED_SYRINGE | INTRAMUSCULAR | Status: DC

## 2017-08-18 MED ORDER — EPHEDRINE SULFATE 50 MG/ML IJ SOLN
INTRAMUSCULAR | Status: DC | PRN
  Administered 2017-08-18: 5 mg via INTRAVENOUS
  Administered 2017-08-18 (×3): 10 mg via INTRAVENOUS

## 2017-08-18 MED ORDER — ACETAMINOPHEN 325 MG PO TABS
650.0000 mg | ORAL_TABLET | ORAL | Status: DC | PRN
  Administered 2017-08-18 (×2): 650 mg via ORAL
  Filled 2017-08-18 (×2): qty 2

## 2017-08-18 MED ORDER — IOPAMIDOL (ISOVUE-370) INJECTION 76%
INTRAVENOUS | Status: DC | PRN
  Administered 2017-08-18: 3 mL via INTRAVENOUS

## 2017-08-18 MED ORDER — DEXAMETHASONE SODIUM PHOSPHATE 10 MG/ML IJ SOLN
INTRAMUSCULAR | Status: DC | PRN
  Administered 2017-08-18: 5 mg via INTRAVENOUS

## 2017-08-18 MED ORDER — HEPARIN SODIUM (PORCINE) 1000 UNIT/ML IJ SOLN
INTRAMUSCULAR | Status: DC | PRN
  Administered 2017-08-18: 2000 [IU] via INTRAVENOUS

## 2017-08-18 MED ORDER — ONDANSETRON HCL 4 MG/2ML IJ SOLN: 4.0000 mg | Freq: Four times a day (QID) | INTRAMUSCULAR | Status: DC | PRN

## 2017-08-18 MED ORDER — ONDANSETRON HCL 4 MG/2ML IJ SOLN
INTRAMUSCULAR | Status: DC | PRN
  Administered 2017-08-18: 4 mg via INTRAVENOUS

## 2017-08-18 MED ORDER — SODIUM CHLORIDE 0.9% FLUSH: 3.0000 mL | INTRAVENOUS | Status: DC | PRN

## 2017-08-18 MED ORDER — GABAPENTIN 300 MG PO CAPS
300.0000 mg | ORAL_CAPSULE | Freq: Three times a day (TID) | ORAL | Status: DC
  Administered 2017-08-18: 300 mg via ORAL
  Filled 2017-08-18: qty 1

## 2017-08-18 MED ORDER — HEPARIN SODIUM (PORCINE) 1000 UNIT/ML IJ SOLN
INTRAMUSCULAR | Status: DC | PRN
  Administered 2017-08-18: 1000 [IU] via INTRAVENOUS
  Administered 2017-08-18: 12000 [IU] via INTRAVENOUS
  Administered 2017-08-18: 1000 [IU] via INTRAVENOUS

## 2017-08-18 MED ORDER — PROTAMINE SULFATE 10 MG/ML IV SOLN
INTRAVENOUS | Status: DC | PRN
Start: 2017-08-18 — End: 2017-08-18
  Administered 2017-08-18: 10 mg via INTRAVENOUS
  Administered 2017-08-18: 20 mg via INTRAVENOUS

## 2017-08-18 MED ORDER — ONDANSETRON HCL 4 MG/2ML IJ SOLN: 4.0000 mg | Freq: Once | INTRAMUSCULAR | Status: DC | PRN

## 2017-08-18 SURGICAL SUPPLY — 17 items
BAG SNAP BAND KOVER 36X36 (MISCELLANEOUS) ×2 IMPLANT
BLANKET WARM UNDERBOD FULL ACC (MISCELLANEOUS) ×2 IMPLANT
CATH NAVISTAR SMARTTOUCH DF (ABLATOR) ×2 IMPLANT
CATH SOUNDSTAR ECO REPROCESSED (CATHETERS) ×2 IMPLANT
CATH VARIABLE LASSO NAV 2515 (CATHETERS) ×2 IMPLANT
CATH WEBSTER BI DIR CS D-F CRV (CATHETERS) ×2 IMPLANT
COVER SWIFTLINK CONNECTOR (BAG) ×2 IMPLANT
NEEDLE TRANSEP BRK 71CM 407200 (NEEDLE) ×2 IMPLANT
PACK EP LATEX FREE (CUSTOM PROCEDURE TRAY) ×1
PACK EP LF (CUSTOM PROCEDURE TRAY) ×1 IMPLANT
PAD DEFIB LIFELINK (PAD) ×2 IMPLANT
PATCH CARTO3 (PAD) ×2 IMPLANT
SHEATH AVANTI 11F 11CM (SHEATH) ×2 IMPLANT
SHEATH PINNACLE 7F 10CM (SHEATH) ×4 IMPLANT
SHEATH PINNACLE 9F 10CM (SHEATH) ×2 IMPLANT
SHEATH SWARTZ TS SL2 63CM 8.5F (SHEATH) ×2 IMPLANT
TUBING SMART ABLATE COOLFLOW (TUBING) ×2 IMPLANT

## 2017-08-18 NOTE — Progress Notes (Signed)
Bedrest up at 530 pm. Ambulated to br with no discomfort or issues. Right groin level 0 with gauze and tegaderm in place. Patient given discharge orders by Yevette Edwards, RN. Given discharge about post-ablation care. Started on protonix po. PIV removed. Patient verbalized understanding and had no further questions. Escorted out by nurse tech via wheelchair.

## 2017-08-18 NOTE — Progress Notes (Signed)
Repeat ACT 197. Spoke w/Dr. Rayann Heman and is ok to pull venous sheaths now

## 2017-08-18 NOTE — Progress Notes (Signed)
Patient refused Flu shot, educated patient about flu shot but still refused.

## 2017-08-18 NOTE — Progress Notes (Signed)
Doing well s/p ablation VSS No concerns Exam normal  DC to home Continue home medicines Add protonix x 45 days  Routine follow-up  Thompson Grayer MD, [facility] 08/18/2017 5:59 PM

## 2017-08-18 NOTE — Anesthesia Postprocedure Evaluation (Signed)
Anesthesia Post Note  Patient: Christina [name]  Procedure(s) Performed: Procedure(s) (LRB): Atrial Fibrillation Ablation (N/A)     Patient location during evaluation: Cath Lab Anesthesia Type: General Level of consciousness: awake, awake and alert and oriented Pain management: pain level controlled Vital Signs Assessment: post-procedure vital signs reviewed and stable Respiratory status: spontaneous breathing, nonlabored ventilation and respiratory function stable Cardiovascular status: blood pressure returned to baseline Anesthetic complications: no    Last Vitals:  Vitals:   08/18/17 1115 08/18/17 1138  BP: (!) 125/56   Pulse: 77   Resp: 15   Temp:  (!) 36.2 C  SpO2: 97%     Last Pain:  Vitals:   08/18/17 1138  TempSrc: Temporal  PainSc:                  Camiah Humm COKER

## 2017-08-18 NOTE — Interval H&P Note (Signed)
History and Physical Interval Note:  08/18/2017 7:15 AM  Christina [name]  has presented today for surgery, with the diagnosis of afib  The various methods of treatment have been discussed with the patient and family. After consideration of risks, benefits and other options for treatment, the patient has consented to  Procedure(s): Atrial Fibrillation Ablation (N/A) as a surgical intervention .  The patient's history has been reviewed, patient examined, no change in status, stable for surgery.  I have reviewed the patient's chart and labs.  Questions were answered to the patient's satisfaction.     Thompson Grayer

## 2017-08-18 NOTE — Anesthesia Preprocedure Evaluation (Addendum)
Anesthesia Evaluation  Patient identified by MRN, date of birth, ID band Patient awake    Reviewed: Allergy & Precautions, NPO status , Patient's Chart, lab work & pertinent test results  Airway Mallampati: II  TM Distance: >3 FB Neck ROM: Full    Dental  (+) Teeth Intact, Dental Advisory Given   Pulmonary    breath sounds clear to auscultation       Cardiovascular  Rhythm:Irregular Rate:Normal     Neuro/Psych    GI/Hepatic   Endo/Other    Renal/GU      Musculoskeletal   Abdominal   Peds  Hematology   Anesthesia Other Findings   Reproductive/Obstetrics                            Anesthesia Physical Anesthesia Plan  ASA: III  Anesthesia Plan: General   Post-op Pain Management:    Induction: Intravenous  PONV Risk Score and Plan: 1 and Ondansetron and Dexamethasone  Airway Management Planned: LMA  Additional Equipment:   Intra-op Plan:   Post-operative Plan:   Informed Consent: I have reviewed the patients History and Physical, chart, labs and discussed the procedure including the risks, benefits and alternatives for the proposed anesthesia with the patient or authorized representative who has indicated his/her understanding and acceptance.   Dental advisory given  Plan Discussed with: CRNA and Anesthesiologist  Anesthesia Plan Comments:         Anesthesia Quick Evaluation

## 2017-08-18 NOTE — Progress Notes (Addendum)
Site area: rt groin fv sheaths x3 Site Prior to Removal:  Level 0 Pressure Applied For: 30 minutes Manual:   yes Patient Status During Pull:  stable Post Pull Site:  Level  0 Post Pull Instructions Given:  yes Post Pull Pulses Present:   palpable Dressing Applied:  Gauze and tegaderm Bedrest begins @   1130 Comments:  IV saline locked

## 2017-08-18 NOTE — Discharge Instructions (Signed)
No driving for 4 days. No lifting over 5 lbs for 1 week. No vigorous or sexual activity for 1 week. You may return to work on 07/26/17. Keep procedure site clean & dry. If you notice increased pain, swelling, bleeding or pus, call/return!  You may shower, but no soaking baths/hot tubs/pools for 1 week.

## 2017-08-18 NOTE — Anesthesia Procedure Notes (Signed)
Procedure Name: LMA Insertion Date/Time: 08/18/2017 7:44 AM Performed by: Salli Quarry Elis Rawlinson Pre-anesthesia Checklist: Patient identified, Emergency Drugs available, Suction available and Patient being monitored Patient Re-evaluated:Patient Re-evaluated prior to induction Oxygen Delivery Method: Circle System Utilized Preoxygenation: Pre-oxygenation with 100% oxygen Induction Type: IV induction LMA: LMA inserted LMA Size: 4.0 Number of attempts: 1 Airway Equipment and Method: Bite block Placement Confirmation: positive ETCO2 Tube secured with: Tape Dental Injury: Teeth and Oropharynx as per pre-operative assessment

## 2017-08-18 NOTE — Transfer of Care (Signed)
Immediate Anesthesia Transfer of Care Note  Patient: Christina [name]  Procedure(s) Performed: Procedure(s): Atrial Fibrillation Ablation (N/A)  Patient Location: Cath Lab  Anesthesia Type:General  Level of Consciousness: awake, alert , oriented and patient cooperative  Airway & Oxygen Therapy: Patient Spontanous Breathing  Post-op Assessment: Report given to RN and Post -op Vital signs reviewed and stable  Post vital signs: Reviewed and stable  Last Vitals:  Vitals:   08/18/17 0543  BP: (!) 152/51  Pulse: (!) 51  Resp: 20  Temp: 36.9 C  SpO2: 99%    Last Pain:  Vitals:   08/18/17 0543  TempSrc: Oral         Complications: No apparent anesthesia complications

## 2017-08-31 DIAGNOSIS — Z23 Encounter for immunization: Secondary | ICD-10-CM | POA: Diagnosis not present

## 2017-09-15 ENCOUNTER — Ambulatory Visit (HOSPITAL_COMMUNITY)
Admission: RE | Admit: 2017-09-15 | Discharge: 2017-09-15 | Disposition: A | Discharge status: Home / Self Care | Payer: Medicare Other | Source: Ambulatory Visit | Attending: Nurse Practitioner | Admitting: Nurse Practitioner

## 2017-09-15 ENCOUNTER — Encounter (HOSPITAL_COMMUNITY): Payer: Self-pay | Admitting: Nurse Practitioner

## 2017-09-15 VITALS — BP 90/56 | HR 43 | Ht 63.0 in | Wt 143.4 lb

## 2017-09-15 DIAGNOSIS — Z9889 Other specified postprocedural states: Secondary | ICD-10-CM | POA: Diagnosis not present

## 2017-09-15 DIAGNOSIS — Z79899 Other long term (current) drug therapy: Secondary | ICD-10-CM | POA: Insufficient documentation

## 2017-09-15 DIAGNOSIS — R42 Dizziness and giddiness: Secondary | ICD-10-CM | POA: Insufficient documentation

## 2017-09-15 DIAGNOSIS — I959 Hypotension, unspecified: Secondary | ICD-10-CM | POA: Insufficient documentation

## 2017-09-15 DIAGNOSIS — I480 Paroxysmal atrial fibrillation: Secondary | ICD-10-CM | POA: Insufficient documentation

## 2017-09-15 DIAGNOSIS — Z7901 Long term (current) use of anticoagulants: Secondary | ICD-10-CM | POA: Insufficient documentation

## 2017-09-15 DIAGNOSIS — I4891 Unspecified atrial fibrillation: Secondary | ICD-10-CM | POA: Diagnosis present

## 2017-09-15 NOTE — Patient Instructions (Signed)
Your physician has recommended you make the following change in your medication:  1)Decrease Atenolol to 1/2 tablet twice a day (25mg  twice a day)

## 2017-09-15 NOTE — Progress Notes (Signed)
Primary Care Physician: Leighton Ruff, MD Referring Physician: Dr. Rayann Heman EP: Dr. Ali Lowe [name] is a 66 y.o. female with a h/o afib that is in the afib clinic today for one month s/p ablation. She noted some afib early on after ablation but rhythm has smoothed out.No groin or swallowing issues. Her heart rate has been slow and BP on the lower side since ablation and she has felt lightheaded.  Today, she denies symptoms of palpitations, chest pain, shortness of breath, orthopnea, PND, lower extremity edema, dizziness, presyncope, syncope, or neurologic sequela. The patient is tolerating medications without difficulties and is otherwise without complaint today.   Past Medical History:  Diagnosis Date  . Appendicitis, acute 06/01/2012  . Current use of long term anticoagulation 07/07/2017  . Headache   . Palpitations 08/30/2014   Seen by Dr. Tollie Eth, had a holter 08/2014   . Paroxysmal atrial fibrillation ([place]) 09/04/2014   Dr. Wynonia Lawman. Started eliquis 08/2014    Past Surgical History:  Procedure Laterality Date  . ACHILLES TENDON SURGERY     left  . ATRIAL FIBRILLATION ABLATION N/A 08/18/2017   Procedure: Atrial Fibrillation Ablation;  Surgeon: Thompson Grayer, MD;  Location: [facility];  Service: Cardiovascular;  Laterality: N/A;  . FINGER SURGERY     left middle finger - pin placed  . HIP SURGERY     4 right hip surgeries  . LAPAROSCOPIC APPENDECTOMY  05/07/2012   Procedure: APPENDECTOMY LAPAROSCOPIC;  Surgeon: Pedro Earls, MD;  Location: [facility];  Service: General;  Laterality: N/A;  . TUBAL LIGATION      Current Outpatient Prescriptions  Medication Sig Dispense Refill  . atenolol (TENORMIN) 50 MG tablet Take 25 mg by mouth 2 (two) times daily.    . calcium-vitamin D 250-100 MG-UNIT tablet Take 2 tablets by mouth 2 (two) times daily.    Marland Kitchen ELIQUIS 5 MG TABS tablet Take 5 mg by mouth 2 (two) times daily.     . flecainide (TAMBOCOR) 100 MG tablet  Take 100 mg by mouth 2 (two) times daily.    Marland Kitchen gabapentin (NEURONTIN) 300 MG capsule Take 1 capsule (300 mg total) by mouth 3 (three) times daily. 270 capsule 3  . ibandronate (BONIVA) 150 MG tablet Take 1 tablet by mouth every 30 (thirty) days.    Marland Kitchen MATZIM LA 180 MG 24 hr tablet Take 180 mg by mouth as needed.     . multivitamin-lutein (OCUVITE-LUTEIN) CAPS Take 1 capsule by mouth daily.    . pantoprazole (PROTONIX) 40 MG tablet Take 1 tablet (40 mg total) by mouth daily. 45 tablet 0  . promethazine (PHENERGAN) 25 MG tablet TAKE 1 TABLET (25 MG TOTAL) BY MOUTH EVERY 8 (EIGHT) HOURS AS NEEDED FOR NAUSEA. 30 tablet 3  . SUMAtriptan (IMITREX) 100 MG tablet TAKE 1 TABLET (100 MG TOTAL) BY MOUTH EVERY 2 (TWO) HOURS AS NEEDED. 10 tablet 3   No current facility-administered medications for this encounter.     Allergies  Allergen Reactions  . Oxycontin [Oxycodone Hcl] Nausea Only  . Metoprolol Nausea Only  . Propranolol Nausea Only    Social History   Social History  . Marital status: Married    Spouse name: [name]   . Number of children: 2  . Years of education: 12+   Occupational History  . Retired     Social History Main Topics  . Smoking status: Never Smoker  . Smokeless tobacco: Never Used  . Alcohol use  Yes     Comment: socially  . Drug use: No  . Sexual activity: Not on file   Other Topics Concern  . Not on file   Social History Narrative   Patient lives at home Husband [name] in [place].    Patient has 2 children.    Patient is right handed.    Patient has a 12+ years of education.    Patient is retired. 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 Hypertension Mother   . Hypertension Sister   . Diabetes Sister     ROS- All systems are reviewed and negative except as per the HPI above  Physical Exam: Vitals:   09/15/17 1034  BP: (!) 90/56  Pulse: (!) 43  Weight: 143 lb 6.4 oz (65 kg)  Height: 5\' 3"   (1.6 m)   Wt Readings from Last 3 Encounters:  09/15/17 143 lb 6.4 oz (65 kg)  08/18/17 138 lb (62.6 kg)  07/29/17 139 lb 6.4 oz (63.2 kg)    Labs: Lab Results  Component Value Date   NA 140 08/18/2017   K 4.3 08/18/2017   CL 103 08/18/2017   CO2 29 08/18/2017   GLUCOSE 94 08/18/2017   BUN 12 08/18/2017   CREATININE 1.00 08/18/2017   CALCIUM 9.4 08/18/2017   Lab Results  Component Value Date   INR 1.05 05/07/2012   No results found for: CHOL, HDL, LDLCALC, TRIG   GEN- The patient is well appearing, alert and oriented x 3 today.   Head- normocephalic, atraumatic Eyes-  Sclera clear, conjunctiva pink Ears- hearing intact Oropharynx- clear Neck- supple, no JVP Lymph- no cervical lymphadenopathy Lungs- Clear to ausculation bilaterally, normal work of breathing Heart- Regular rate and rhythm, no murmurs, rubs or gallops, PMI not laterally displaced GI- soft, NT, ND, + BS Extremities- no clubbing, cyanosis, or edema MS- no significant deformity or atrophy Skin- no rash or lesion Psych- euthymic mood, full affect Neuro- strength and sensation are intact  EKG-Marked sinus brady at 43 bpm, pr int 159 ms, qrs int 84 ms, qtc 441 ms Epic records reviewed    Assessment and Plan: 1. afib S/p ablation  No significant afib since after first week of ablation Continue flecainide 100 mg bid Continue Eliquis 5 mg bid for a chadsvasc score of at least 2  2. Lightheadedness HR/BP low Decrease atenolol to 50 mg 1/2 tab bid   F/u in one week for EKG V/S check  [name], [facility] [address] [place],  [address] [phone]

## 2017-09-23 ENCOUNTER — Ambulatory Visit (HOSPITAL_COMMUNITY)
Admission: RE | Admit: 2017-09-23 | Discharge: 2017-09-23 | Disposition: A | Discharge status: Home / Self Care | Payer: Medicare Other | Source: Ambulatory Visit | Attending: Nurse Practitioner | Admitting: Nurse Practitioner

## 2017-09-23 DIAGNOSIS — I4891 Unspecified atrial fibrillation: Secondary | ICD-10-CM | POA: Diagnosis not present

## 2017-09-23 DIAGNOSIS — R9431 Abnormal electrocardiogram [ECG] [EKG]: Secondary | ICD-10-CM | POA: Insufficient documentation

## 2017-09-23 NOTE — Patient Instructions (Signed)
Your physician has recommended you make the following change in your medication:  1)Increase atenolol to 50mg  twice a day

## 2017-09-23 NOTE — Progress Notes (Addendum)
PT in for repeat EKG after decreasing atenolol to 1/2 tablet twice a day.  EKG to be reviewed by Roderic Palau, NP  Atenolol decreased last week for HR in the low 40's and low BP. Today, she returned for f/u EKG and BP. She is back in afib at 110 bpm, with BP at 112/82. Increase atenolol back to 50 mg bid and return in one week for EKG and BP check. She feels that she may have gone back to afib yesterday evening. Continue flecainide at 100 mg bid, eliquis 5 mg

## 2017-09-24 DIAGNOSIS — E559 Vitamin D deficiency, unspecified: Secondary | ICD-10-CM | POA: Diagnosis not present

## 2017-09-30 ENCOUNTER — Ambulatory Visit (HOSPITAL_COMMUNITY)
Admission: RE | Admit: 2017-09-30 | Discharge: 2017-09-30 | Disposition: A | Discharge status: Home / Self Care | Payer: Medicare Other | Source: Ambulatory Visit | Attending: Nurse Practitioner | Admitting: Nurse Practitioner

## 2017-09-30 ENCOUNTER — Encounter (HOSPITAL_COMMUNITY): Payer: Self-pay | Admitting: Nurse Practitioner

## 2017-09-30 DIAGNOSIS — I4891 Unspecified atrial fibrillation: Secondary | ICD-10-CM | POA: Diagnosis not present

## 2017-09-30 DIAGNOSIS — R001 Bradycardia, unspecified: Secondary | ICD-10-CM | POA: Diagnosis not present

## 2017-09-30 NOTE — Progress Notes (Addendum)
Pt in for repeat EKG.  To be reviewed by Roderic Palau, NP  I had reduced atenolol to 25 mg bid  for bradycardia and she returned in afib. BB restarted at previous dose (50 mg bid) and she is in Sinus brady at 56 bpm, Will continue this dose if not symptomatic. Call if needed, otherwise will see Dr. Rayann Heman  As scheduled 12/31.

## 2017-10-01 DIAGNOSIS — M25511 Pain in right shoulder: Secondary | ICD-10-CM | POA: Diagnosis not present

## 2017-10-09 ENCOUNTER — Other Ambulatory Visit: Payer: Self-pay | Admitting: Internal Medicine

## 2017-10-14 DIAGNOSIS — M19011 Primary osteoarthritis, right shoulder: Secondary | ICD-10-CM | POA: Diagnosis not present

## 2017-11-12 DIAGNOSIS — M19011 Primary osteoarthritis, right shoulder: Secondary | ICD-10-CM | POA: Diagnosis not present

## 2017-11-23 ENCOUNTER — Ambulatory Visit (INDEPENDENT_AMBULATORY_CARE_PROVIDER_SITE_OTHER): Payer: Medicare Other | Admitting: Internal Medicine

## 2017-11-23 ENCOUNTER — Encounter: Payer: Self-pay | Admitting: Internal Medicine

## 2017-11-23 VITALS — BP 124/68 | HR 110 | Ht 63.0 in | Wt 142.4 lb

## 2017-11-23 DIAGNOSIS — I480 Paroxysmal atrial fibrillation: Secondary | ICD-10-CM

## 2017-11-23 DIAGNOSIS — K219 Gastro-esophageal reflux disease without esophagitis: Secondary | ICD-10-CM

## 2017-11-23 NOTE — Patient Instructions (Signed)
Medication Instructions:  Your physician recommends that you continue on your current medications as directed. Please refer to the Current Medication list given to you today.   Labwork: None ordered   Testing/Procedures: None ordered   Follow-Up: Your physician recommends that you schedule a follow-up appointment in: 2 months with Dr Allred   Any Other Special Instructions Will Be Listed Below (If Applicable).     If you need a refill on your cardiac medications before your next appointment, please call your pharmacy.   

## 2017-11-23 NOTE — Progress Notes (Signed)
PCP: Christina Ruff, MD Primary Cardiologist: Dr Darlen Round is a 66 y.o. female who presents today for routine electrophysiology followup.  Since his recent afib ablation, the patient reports doing reasonably well.  She has had some ERAF.   she denies procedure related complications and is pleased with the results of the procedure.  Today, she denies symptoms of palpitations, exertional chest pain, shortness of breath,  lower extremity edema, dizziness, presyncope, or syncope.  Since taking PPI, she finds that she has significant indigestion when she stops it.  She has therefore continued protonix. The patient is otherwise without complaint today.   Past Medical History:  Diagnosis Date  . Appendicitis, acute 06/01/2012  . Current use of long term anticoagulation 07/07/2017  . Headache   . Palpitations 08/30/2014   Seen by Dr. Tollie Eth, had a holter 08/2014   . Paroxysmal atrial fibrillation ([place]) 09/04/2014   Dr. Wynonia Lawman. Started eliquis 08/2014    Past Surgical History:  Procedure Laterality Date  . ACHILLES TENDON SURGERY     left  . ATRIAL FIBRILLATION ABLATION N/A 08/18/2017   Procedure: Atrial Fibrillation Ablation;  Surgeon: Thompson Grayer, MD;  Location: [place] [facility];  Service: Cardiovascular;  Laterality: N/A;  . FINGER SURGERY     left middle finger - pin placed  . HIP SURGERY     4 right hip surgeries  . LAPAROSCOPIC APPENDECTOMY  05/07/2012   Procedure: APPENDECTOMY LAPAROSCOPIC;  Surgeon: Pedro Earls, MD;  [facility];  Service: General;  Laterality: N/A;  . TUBAL LIGATION      ROS- all systems are personally reviewed and negatives except as per HPI above  Current Outpatient Medications  Medication Sig Dispense Refill  . atenolol (TENORMIN) 50 MG tablet Take 50 mg by mouth 2 (two) times daily.    . calcium-vitamin D 250-100 MG-UNIT tablet Take 2 tablets by mouth 2 (two) times daily.    Marland Kitchen ELIQUIS 5 MG TABS tablet Take 5 mg by mouth 2  (two) times daily.     . flecainide (TAMBOCOR) 100 MG tablet Take 100 mg by mouth 2 (two) times daily.    Marland Kitchen gabapentin (NEURONTIN) 300 MG capsule Take 1 capsule (300 mg total) by mouth 3 (three) times daily. 270 capsule 3  . ibandronate (BONIVA) 150 MG tablet Take 1 tablet by mouth every 30 (thirty) days.    Marland Kitchen MATZIM LA 180 MG 24 hr tablet Take 180 mg by mouth as needed.     . multivitamin-lutein (OCUVITE-LUTEIN) CAPS Take 1 capsule by mouth daily.    . pantoprazole (PROTONIX) 40 MG tablet TAKE 1 TABLET BY MOUTH EVERY DAY 90 tablet 3  . promethazine (PHENERGAN) 25 MG tablet TAKE 1 TABLET (25 MG TOTAL) BY MOUTH EVERY 8 (EIGHT) HOURS AS NEEDED FOR NAUSEA. 30 tablet 3  . SUMAtriptan (IMITREX) 100 MG tablet TAKE 1 TABLET (100 MG TOTAL) BY MOUTH EVERY 2 (TWO) HOURS AS NEEDED. 10 tablet 3   No current facility-administered medications for this visit.     Physical Exam: Vitals:   11/23/17 1007  BP: 124/68  Pulse: (!) 110  Weight: 142 lb 6.4 oz (64.6 kg)  Height: 5\' 3"  (1.6 m)    GEN- The patient is well appearing, alert and oriented x 3 today.   Head- normocephalic, atraumatic Eyes-  Sclera clear, conjunctiva pink Ears- hearing intact Oropharynx- clear Lungs- Clear to ausculation bilaterally, normal work of breathing Heart- tachycardic irregular rhythm, no murmurs, rubs or gallops,  PMI not laterally displaced GI- soft, NT, ND, + BS Extremities- no clubbing, cyanosis, or edema  EKG tracing ordered today is personally reviewed and shows atypical appearing atrial flutter  Assessment and Plan:  1. Persistent/ paroxysmal atrial fibrillation Doing well s/p ablation Unfortunately, she has atypical atrial flutter today.  If this continues, she will contact AF clinic for cardioversion.  I will see her in 2 months.  If still having arrhythmias at that time, we will consider repeat ablation. chads2vasc score is 2.  Continue anticoagulation  2. GERD She will try to wean off protonix.  If  persists upon return, will have her see Dr Collene Mares  Return to see me in 2 months  Thompson Grayer MD, [facility] 11/23/2017 10:22 AM

## 2017-11-24 DIAGNOSIS — Z9289 Personal history of other medical treatment: Secondary | ICD-10-CM

## 2017-11-24 HISTORY — DX: Personal history of other medical treatment: Z92.89

## 2017-11-26 DIAGNOSIS — Z7901 Long term (current) use of anticoagulants: Secondary | ICD-10-CM | POA: Diagnosis not present

## 2017-11-26 DIAGNOSIS — I491 Atrial premature depolarization: Secondary | ICD-10-CM | POA: Diagnosis not present

## 2017-11-26 DIAGNOSIS — R008 Other abnormalities of heart beat: Secondary | ICD-10-CM | POA: Diagnosis not present

## 2017-11-26 DIAGNOSIS — I480 Paroxysmal atrial fibrillation: Secondary | ICD-10-CM | POA: Diagnosis not present

## 2017-11-26 DIAGNOSIS — I493 Ventricular premature depolarization: Secondary | ICD-10-CM | POA: Diagnosis not present

## 2017-11-26 NOTE — H&P (Signed)
Christina [name]  Date of visit:  11/26/2017 DOB:  06-25-1951    Age:  67 yrs. Medical record number:  [id number]     Account number:  [id number] Primary Care Provider: [facility] ____________________________ CURRENT DIAGNOSES  1. Paroxysmal atrial fibrillation  2. Atrial premature depolarization  3. Long term (current) use of anticoagulants  4. Ventricular premature depolarization  5. Palpitations ____________________________ ALLERGIES  Hydrocodone, Nausea  Metoprolol, Nausea  Propranolol, Intolerance-unknown ____________________________ MEDICATIONS  1. alendronate 70 mg tablet, 150mg  once a month  2. atenolol 50 mg tablet, BID  3. calcium carbonate-vitamin D2 600 mg calcium- 200 unit capsule, 1 p.o. daily  4. diltiazem CD 180 mg capsule,extended release 24 hr, 1 tablet PO daily PRN  5. Eliquis 5 mg tablet, BID  6. flecainide 100 mg tablet, take 1 tablet in the morning and 1/2 tablet in the evening  7. gabapentin 300 mg capsule, QID  8. ICaps AREDS 14,320 unit-226 mg-200 unit capsule, BID  9. Imitrex 100 mg tablet, PRN  10. Migraine Formula 250 mg-250 mg-65 mg tablet, PRN  11. pantoprazole 40 mg tablet,delayed release, 1/2 tab daily  12. promethazine 25 mg tablet, PRN ____________________________ HISTORY OF PRESENT ILLNESS Patient returns for cardiac followup. She had an ablation for atrial fibrillation in September and did well with this. She was back in atrial flutter earlier this week when she was seen by the electrophysiologist has no longer had any of the severe pounding or racing but she had previously. She remains on flecainide, atenolol and diltiazem. She denies angina and has no PND or orthopnea or edema. She does not feel as well when she is out of rhythm in terms of her energy and notes some fatigue today. She has not missed any doses of anticoagulation. ____________________________ PAST HISTORY  Past Medical Illnesses:  migraine headaches, denies hypertension or  diabetes;  Cardiovascular Illnesses:  arrhythmia-PACs, arrhythmia-PVCs, atrial fibrillation-paroxysmal;  Surgical Procedures:  hip replacement-rt, tubal ligation, l leg tendon surgery, appendectomy;  NYHA Classification:  I;  Canadian Angina Classification:  Class 0: Asymptomatic;  Cardiology Procedures-Invasive:  RF ablation for atrial fibrillation September 2018, Coronary CTA 08/12/17 calcium score zero, no CAD;  Cardiology Procedures-Noninvasive:  treadmill, holter monitor, echocardiogram June 2009, event monitor August 2015, echocardiogram October 2015, event monitor February 2018, echocardiogram 2018;  LVEF of 55% documented via echocardiogram on 09/04/2014,   CHA2DS2-VASC Score:  2 ____________________________ CARDIO-PULMONARY TEST DATES EKG Date:  11/26/2017;  Holter/Event Monitor Date: 01/12/2017;  Echocardiography Date: 01/16/2017;   ____________________________ SOCIAL HISTORY Alcohol Use:  socially;  Smoking:  nonsmoker;  Diet:  regular diet;  Lifestyle:  married;  Exercise:  walking;  Occupation:  Estate manager/land agent;  Residence:  lives with husband;   ____________________________ REVIEW OF SYSTEMS General:  malaise and fatigue  Integumentary:no rashes or new skin lesions. Eyes: wears eye glasses/contact lenses Ears, Nose, Throat, Mouth:  denies any hearing loss, epistaxis, hoarseness or difficulty speaking. Respiratory: dyspnea, some snoring Cardiovascular:  please review HPI Abdominal: denies dyspepsia, GI bleeding, constipation, or diarrheaGenitourinary-Female: no dysuria, urgency, frequency, UTIs, or stress incontinence Musculoskeletal:  denies arthritis, venous insufficiency, or muscle weakness Neurological:  occasional migraine headaches  ____________________________ PHYSICAL EXAMINATION VITAL SIGNS  Blood Pressure:  100/80 Sitting, Left arm, regular cuff  , 100/70 Standing, Left arm and regular cuff   Pulse:  98/min. Weight:  143.00 lbs. Height:  63.00"BMI: 25  Constitutional:   pleasant white female, in no acute distress Skin:  warm and dry to touch, no apparent skin lesions,  or masses noted. Head:  normocephalic, normal hair pattern, no masses or tenderness Neck:  supple, without massess. No JVD, thyromegaly or carotid bruits. Carotid upstroke normal. Chest:  normal symmetry, clear to auscultation. Cardiac:  irregularly irregular rhythm, normal S1 and S2, no S3 or S4 Peripheral Pulses:  the femoral,dorsalis pedis, and posterior tibial pulses are full and equal bilaterally with no bruits auscultated. Extremities & Back:  no deformities, clubbing, cyanosis, erythema or edema observed. Normal muscle strength and tone. Neurological:  no gross motor or sensory deficits noted, affect appropriate, oriented x3. ____________________________ IMPRESSIONS/PLAN  1. Recurrent atypical atrial flutter following ablation in September 2. Long-term use of anticoagulation 3. Malaise and fatigue secondary to the above  Recommendations:  Previous records reviewed and case discussed with Dr. Rayann Heman. She has atypical atrial flutter on 12-lead EKG personally reviewed by me today with controlled response. Dr. Rayann Heman had mentioned a cardioversion and will go ahead and proceed with cardioversion to see if she will maintain rhythm if not she potentially could need to have a repeat ablation for typical flutter.Cardioversion discussed with the patient including risks of stroke, arrhythmia, death, or anesthesia risks. The patient understands and is willing to proceed.. we will plan to do this next Wednesday.  Greater than 40 minutes spent with greater than 50% of the time spent face-to-face including counseling, coordination of care and setting up cardioversion for atypical atrial flutter following radiofrequency ablation. ____________________________ TODAYS ORDERS  1. Basic Metabolic Panel: Today  2. CBC [name]/out Diff: Today  3. 12 Lead EKG: Today  4. Electrical Cardioversion: At Patient  Convenience                       ____________________________ Cardiology Physician:  Kerry Hough MD [facility]

## 2017-12-02 ENCOUNTER — Ambulatory Visit (HOSPITAL_COMMUNITY)
Admission: RE | Admit: 2017-12-02 | Discharge: 2017-12-02 | Disposition: A | Discharge status: Home / Self Care | Payer: Medicare Other | Source: Ambulatory Visit | Attending: Cardiology | Admitting: Cardiology

## 2017-12-02 ENCOUNTER — Encounter (HOSPITAL_COMMUNITY)
Admission: RE | Disposition: A | Discharge status: Home / Self Care | Payer: Self-pay | Source: Ambulatory Visit | Attending: Cardiology

## 2017-12-02 ENCOUNTER — Encounter (HOSPITAL_COMMUNITY): Payer: Self-pay

## 2017-12-02 ENCOUNTER — Other Ambulatory Visit: Payer: Self-pay

## 2017-12-02 ENCOUNTER — Ambulatory Visit (HOSPITAL_COMMUNITY): Payer: Medicare Other | Admitting: Certified Registered"

## 2017-12-02 DIAGNOSIS — I480 Paroxysmal atrial fibrillation: Secondary | ICD-10-CM | POA: Insufficient documentation

## 2017-12-02 DIAGNOSIS — I491 Atrial premature depolarization: Secondary | ICD-10-CM | POA: Insufficient documentation

## 2017-12-02 DIAGNOSIS — I4892 Unspecified atrial flutter: Secondary | ICD-10-CM | POA: Diagnosis not present

## 2017-12-02 DIAGNOSIS — Z96641 Presence of right artificial hip joint: Secondary | ICD-10-CM | POA: Diagnosis not present

## 2017-12-02 DIAGNOSIS — Z79899 Other long term (current) drug therapy: Secondary | ICD-10-CM | POA: Insufficient documentation

## 2017-12-02 DIAGNOSIS — I484 Atypical atrial flutter: Secondary | ICD-10-CM | POA: Insufficient documentation

## 2017-12-02 DIAGNOSIS — I4581 Long QT syndrome: Secondary | ICD-10-CM | POA: Diagnosis not present

## 2017-12-02 DIAGNOSIS — Z7901 Long term (current) use of anticoagulants: Secondary | ICD-10-CM | POA: Insufficient documentation

## 2017-12-02 DIAGNOSIS — G43009 Migraine without aura, not intractable, without status migrainosus: Secondary | ICD-10-CM | POA: Diagnosis not present

## 2017-12-02 DIAGNOSIS — Z7983 Long term (current) use of bisphosphonates: Secondary | ICD-10-CM | POA: Diagnosis not present

## 2017-12-02 DIAGNOSIS — R002 Palpitations: Secondary | ICD-10-CM | POA: Diagnosis not present

## 2017-12-02 DIAGNOSIS — G43909 Migraine, unspecified, not intractable, without status migrainosus: Secondary | ICD-10-CM | POA: Diagnosis not present

## 2017-12-02 HISTORY — PX: CARDIOVERSION: SHX1299

## 2017-12-02 SURGERY — CARDIOVERSION
Anesthesia: General

## 2017-12-02 MED ORDER — ATENOLOL 50 MG PO TABS: 50.0000 mg | ORAL_TABLET | Freq: Every day | ORAL | Status: DC

## 2017-12-02 MED ORDER — PHENYLEPHRINE HCL 10 MG/ML IJ SOLN
INTRAMUSCULAR | Status: DC | PRN
  Administered 2017-12-02: 80 ug via INTRAVENOUS

## 2017-12-02 MED ORDER — SODIUM CHLORIDE 0.9 % IV SOLN
INTRAVENOUS | Status: AC | PRN
  Administered 2017-12-02: 500 mL via INTRAVENOUS

## 2017-12-02 MED ORDER — SODIUM CHLORIDE 0.9 % IV SOLN
INTRAVENOUS | Status: DC
  Administered 2017-12-02 (×2): via INTRAVENOUS

## 2017-12-02 MED ORDER — PROPOFOL 10 MG/ML IV BOLUS
INTRAVENOUS | Status: DC | PRN
  Administered 2017-12-02: 70 mg via INTRAVENOUS

## 2017-12-02 MED ORDER — HYDROCORTISONE 1 % EX CREA: 1.0000 "application " | TOPICAL_CREAM | Freq: Three times a day (TID) | CUTANEOUS | Status: DC | PRN

## 2017-12-02 MED ORDER — EPHEDRINE SULFATE 50 MG/ML IJ SOLN
INTRAMUSCULAR | Status: DC | PRN
  Administered 2017-12-02: 15 mg via INTRAVENOUS

## 2017-12-02 MED ORDER — LIDOCAINE HCL (CARDIAC) 20 MG/ML IV SOLN
INTRAVENOUS | Status: DC | PRN
  Administered 2017-12-02: 100 mg via INTRATRACHEAL

## 2017-12-02 NOTE — Anesthesia Preprocedure Evaluation (Addendum)
Anesthesia Evaluation  Patient identified by MRN, date of birth, ID band Patient awake    Reviewed: Allergy & Precautions, NPO status , Patient's Chart, lab work & pertinent test results  Airway Mallampati: II  TM Distance: >3 FB Neck ROM: Full    Dental  (+) Teeth Intact, Dental Advisory Given   Pulmonary    Pulmonary exam normal        Cardiovascular Normal cardiovascular exam+ dysrhythmias Atrial Fibrillation      Neuro/Psych    GI/Hepatic   Endo/Other    Renal/GU      Musculoskeletal   Abdominal   Peds  Hematology   Anesthesia Other Findings   Reproductive/Obstetrics                            Anesthesia Physical Anesthesia Plan  ASA: III  Anesthesia Plan: General   Post-op Pain Management:    Induction: Intravenous  PONV Risk Score and Plan: Treatment may vary due to age or medical condition  Airway Management Planned: Mask  Additional Equipment:   Intra-op Plan:   Post-operative Plan:   Informed Consent: I have reviewed the patients History and Physical, chart, labs and discussed the procedure including the risks, benefits and alternatives for the proposed anesthesia with the patient or authorized representative who has indicated his/her understanding and acceptance.   Dental advisory given  Plan Discussed with: CRNA and Surgeon  Anesthesia Plan Comments:        Anesthesia Quick Evaluation

## 2017-12-02 NOTE — Discharge Instructions (Signed)
Electrical Cardioversion, Care After °This sheet gives you information about how to care for yourself after your procedure. Your health care provider may also give you more specific instructions. If you have problems or questions, contact your health care provider. °What can I expect after the procedure? °After the procedure, it is common to have: °· Some redness on the skin where the shocks were given. ° °Follow these instructions at home: °· Do not drive for 24 hours if you were given a medicine to help you relax (sedative). °· Take over-the-counter and prescription medicines only as told by your health care provider. °· Ask your health care provider how to check your pulse. Check it often. °· Rest for 48 hours after the procedure or as told by your health care provider. °· Avoid or limit your caffeine use as told by your health care provider. °Contact a health care provider if: °· You feel like your heart is beating too quickly or your pulse is not regular. °· You have a serious muscle cramp that does not go away. °Get help right away if: °· You have discomfort in your chest. °· You are dizzy or you feel faint. °· You have trouble breathing or you are short of breath. °· Your speech is slurred. °· You have trouble moving an arm or leg on one side of your body. °· Your fingers or toes turn cold or blue. °This information is not intended to replace advice given to you by your health care provider. Make sure you discuss any questions you have with your health care provider. °Document Released: 08/31/2013 Document Revised: 06/13/2016 Document Reviewed: 05/16/2016 °Elsevier Interactive Patient Education © 2018 [employer] ° °

## 2017-12-02 NOTE — Interval H&P Note (Signed)
History and Physical Interval Note:  12/02/2017 1:48 PM  Christina [name]  has presented today for surgery, with the diagnosis of AFIB  The various methods of treatment have been discussed with the patient and family. After consideration of risks, benefits and other options for treatment, the patient has consented to  Procedure(s): CARDIOVERSION (N/A) as a surgical intervention .  The patient's history has been reviewed, patient examined, no change in status, stable for surgery.  I have reviewed the patient's chart and labs.  Questions were answered to the patient's satisfaction.     Ezzard Standing

## 2017-12-02 NOTE — Anesthesia Postprocedure Evaluation (Signed)
Anesthesia Post Note  Patient: Christina [name]  Procedure(s) Performed: CARDIOVERSION (N/A )     Patient location during evaluation: PACU Anesthesia Type: General Level of consciousness: awake and alert Pain management: pain level controlled Vital Signs Assessment: post-procedure vital signs reviewed and stable Respiratory status: spontaneous breathing, nonlabored ventilation, respiratory function stable and patient connected to nasal cannula oxygen Cardiovascular status: blood pressure returned to baseline and stable Postop Assessment: no apparent nausea or vomiting Anesthetic complications: no    Last Vitals:  Vitals:   12/02/17 1429 12/02/17 1440  BP: (!) 82/32 (!) 91/35  Pulse: 68 71  Resp: (!) 22 20  Temp:    SpO2: 97% 97%    Last Pain:  Vitals:   12/02/17 1410  TempSrc: Oral                 [name]

## 2017-12-02 NOTE — Transfer of Care (Signed)
Immediate Anesthesia Transfer of Care Note  Patient: Christina [name]  Procedure(s) Performed: CARDIOVERSION (N/A )  Patient Location: Endoscopy Unit  Anesthesia Type:General  Level of Consciousness: awake, alert  and oriented  Airway & Oxygen Therapy: Patient Spontanous Breathing  Post-op Assessment: Report given to RN, Post -op Vital signs reviewed and stable and Patient moving all extremities  Post vital signs: Reviewed and stable  Last Vitals:  Vitals:   12/02/17 1302  BP: (!) 89/49  Pulse: (!) 104  Resp: 18  Temp: 36.6 C  SpO2: 96%    Last Pain:  Vitals:   12/02/17 1302  TempSrc: Oral         Complications: No apparent anesthesia complications

## 2017-12-02 NOTE — CV Procedure (Signed)
Electrical Cardioversion Procedure Note  Christina [name]   67 y.o. female MRN: [id number] DOB: 09-24-1951  Today's date: 12/02/2017  Procedure: Electrical Cardioversion  Indications:  Atrial Flutter  Time Out: Verified patient identification, verified procedure,medications/allergies/relevent history reviewed, required imaging and test results available.  Performed  Procedure Details  The patient was NPO after midnight. Anesthesia was administered at the beside  by [facility] with 80 mg of propofol.  Cardioversion was done with synchronized biphasic defibrillation with AP pads with 100*watts.  The patient converted to normal sinus rhythm.  She had some post propofol hypotension treated with ephedrine with increase in her BP. The patient tolerated the procedure well   IMPRESSION:  Successful cardioversion of atrial fibrillation    W. Tollie Eth, Brooke Bonito. MD [facility]   12/02/2017, 2:07 PM

## 2017-12-02 NOTE — Anesthesia Procedure Notes (Signed)
Procedure Name: MAC Date/Time: 12/02/2017 1:47 PM Performed by: Leonor Liv, CRNA Oxygen Delivery Method: Ambu bag

## 2017-12-03 ENCOUNTER — Encounter (HOSPITAL_COMMUNITY): Payer: Self-pay | Admitting: Cardiology

## 2017-12-17 DIAGNOSIS — I480 Paroxysmal atrial fibrillation: Secondary | ICD-10-CM | POA: Diagnosis not present

## 2017-12-17 DIAGNOSIS — Z7901 Long term (current) use of anticoagulants: Secondary | ICD-10-CM | POA: Diagnosis not present

## 2017-12-17 DIAGNOSIS — R008 Other abnormalities of heart beat: Secondary | ICD-10-CM | POA: Diagnosis not present

## 2017-12-17 DIAGNOSIS — I491 Atrial premature depolarization: Secondary | ICD-10-CM | POA: Diagnosis not present

## 2017-12-17 DIAGNOSIS — I493 Ventricular premature depolarization: Secondary | ICD-10-CM | POA: Diagnosis not present

## 2017-12-25 ENCOUNTER — Encounter: Payer: Self-pay | Admitting: Gastroenterology

## 2018-01-20 ENCOUNTER — Encounter: Payer: Self-pay | Admitting: Internal Medicine

## 2018-01-20 ENCOUNTER — Ambulatory Visit (INDEPENDENT_AMBULATORY_CARE_PROVIDER_SITE_OTHER): Payer: Medicare Other | Admitting: Internal Medicine

## 2018-01-20 ENCOUNTER — Telehealth (HOSPITAL_COMMUNITY): Payer: Self-pay | Admitting: *Deleted

## 2018-01-20 VITALS — BP 112/72 | HR 103 | Ht 63.0 in | Wt 145.0 lb

## 2018-01-20 DIAGNOSIS — I4892 Unspecified atrial flutter: Secondary | ICD-10-CM

## 2018-01-20 DIAGNOSIS — I481 Persistent atrial fibrillation: Secondary | ICD-10-CM

## 2018-01-20 DIAGNOSIS — I4819 Other persistent atrial fibrillation: Secondary | ICD-10-CM

## 2018-01-20 NOTE — Patient Instructions (Addendum)
Medication Instructions:  Your physician recommends that you continue on your current medications as directed. Please refer to the Current Medication list given to you today.  Labwork: Lab appointment for BMET/CBC on 02/02/18   Testing/Procedures: Your physician has recommended that you have an ablation on 02/11/18. Catheter ablation is a medical procedure used to treat some cardiac arrhythmias (irregular heartbeats). During catheter ablation, a long, thin, flexible tube is put into a blood vessel in your groin (upper thigh), or neck. This tube is called an ablation catheter. It is then guided to your heart through the blood vessel. Radio frequency waves destroy small areas of heart tissue where abnormal heartbeats may cause an arrhythmia to start. Please see the instruction sheet given to you today.  Your physician has requested that you have cardiac CT within 7 days prior to your ablation. Cardiac computed tomography (CT) is a painless test that uses an x-ray machine to take clear, detailed pictures of your heart.    Follow-Up: Follow-up appointment in 4 weeks after your procedure at the [facility] with Roderic Palau, NP and 3 months after your procedure with Dr. Rayann Heman.     Instructions for your Ablation Procedure:  Please report to the [facility] 02/11/18 at 9:30 AM  Nothing to eat or drink after midnight the night prior to procedure  Do not take any medications the morning of the procedure  Plan 1 night stay   Someone will need to drive you home at discharge    Instructions for your Cardiac CT:  Please arrive at the [facility] main entrance of [facility] at ______ AM on________ (30-45 minutes prior to test start time)  [facility] [place], [place] [address] [phone]  Proceed to the [facility] Radiology Department (First Floor).  Please follow these instructions carefully (unless otherwise  directed):  Hold all erectile dysfunction medications at least 48 hours prior to test.  On the Night Before the Test: . Drink plenty of water. . Do not consume any caffeinated/decaffeinated beverages or chocolate 12 hours prior to your test. . Do not take any antihistamines 12 hours prior to your test.   On the Day of the Test: . Drink plenty of water. Do not drink any water within one hour of the test. . Do not eat any food 4 hours prior to the test. . You may take your regular medications prior to the test.   After the Test: . Drink plenty of water. . After receiving IV contrast, you may experience a mild flushed feeling. This is normal. . On occasion, you may experience a mild rash up to 24 hours after the test. This is not dangerous. If this occurs, you can take Benadryl 25 mg and increase your fluid intake. . If you experience trouble breathing, this can be serious. If it is severe call 911 IMMEDIATELY. If it is mild, please call our office. . If you take any of these medications: Glipizide/Metformin, Avandament, Glucavance, please do not take 48 hours after completing test.   Cardiac Ablation Cardiac ablation is a procedure to stop some heart tissue from causing problems. The heart has many electrical connections. Sometimes these connections make the heart beat very fast or irregularly. Removing some problem areas can improve the heart rhythm or make it normal. What happens before the procedure?  Follow instructions from your doctor about what you cannot eat or drink.  Ask your doctor about: ?  Changing or stopping your normal medicines. This is important if you take diabetes medicines or blood thinners. ? Taking medicines such as aspirin and ibuprofen. These medicines can thin your blood. Do not take these medicines before your procedure if your doctor tells you not to.  Plan to have someone take you home.  If you will be going home right after the procedure, plan to have  someone with you for 24 hours. What happens during the procedure?  To lower your risk of infection: ? Your health care team will wash or sanitize their hands. ? Your skin will be washed with soap. ? Hair may be removed from your neck or groin.  An IV tube will be put into one of your veins.  You will be given a medicine to help you relax (sedative).  Skin on your neck or groin will be numbed.  A cut (incision) will be made in your neck or groin.  A needle will be put through your cut and into a vein in your neck or groin.  A tube (catheter) will be put into the needle. The tube will be moved to your heart. X-rays (fluoroscopy) will be used to help guide the tube.  Small devices (electrodes) on the tip of the tube will send out electrical currents.  Dye may be put through the tube. This helps your surgeon see your heart.  Electrical energy will be used to scar (ablate) some heart tissue. Your surgeon may use: ? Heat (radiofrequency energy). ? Laser energy. ? Extreme cold (cryoablation).  The tube will be taken out.  Pressure will be held on your cut. This helps stop bleeding.  A bandage (dressing) will be put on your cut. The procedure may vary. What happens after the procedure?  You will be monitored until your medicines have worn off.  Your cut will be watched for bleeding. You will need to lie still for a few hours.  Do not drive for 24 hours or as long as your doctor tells you. Summary  Cardiac ablation is a procedure to stop some heart tissue from causing problems.  Electrical energy will be used to scar (ablate) some heart tissue. This information is not intended to replace advice given to you by your health care provider. Make sure you discuss any questions you have with your health care provider. Document Released: 07/13/2013 Document Revised: 09/29/2016 Document Reviewed: 09/29/2016 Elsevier Interactive Patient Education  2017 [employer].

## 2018-01-20 NOTE — Telephone Encounter (Signed)
Pt needs a 4 week follow up after ablation 02/11/18-LMOM for pt to clbk to sched

## 2018-01-20 NOTE — H&P (View-Only) (Signed)
PCP: Leighton Ruff, MD Primary Cardiologist: Dr Wynonia Lawman Primary EP: Dr Rayann Heman  Christina [name] is a 67 y.o. female who presents today for routine electrophysiology followup.  Since last being seen in our clinic, the patient reports doing reasonably well.  She continues to have atrial arrhythmias.  She underwent cardioversion 12/02/17 but unfortunately has returned to atrial flutter.  She has fatigue and palpitations.  Today, she denies symptoms of chest pain, shortness of breath,  lower extremity edema, dizziness, presyncope, or syncope.  The patient is otherwise without complaint today.   Past Medical History:  Diagnosis Date  . Appendicitis, acute 06/01/2012  . Current use of long term anticoagulation 07/07/2017  . Headache   . Palpitations 08/30/2014   Seen by Dr. Tollie Eth, had a holter 08/2014   . Paroxysmal atrial fibrillation ([place]) 09/04/2014   Dr. Wynonia Lawman. Started eliquis 08/2014    Past Surgical History:  Procedure Laterality Date  . ACHILLES TENDON SURGERY     left  . ATRIAL FIBRILLATION ABLATION N/A 08/18/2017   Procedure: Atrial Fibrillation Ablation;  Surgeon: Thompson Grayer, MD;  Location: [place] CV LAB;  Service: Cardiovascular;  Laterality: N/A;  . CARDIOVERSION N/A 12/02/2017   Procedure: CARDIOVERSION;  Surgeon: Jacolyn Reedy, MD;  Location: [facility] ENDOSCOPY;  Service: Cardiovascular;  Laterality: N/A;  . FINGER SURGERY     left middle finger - pin placed  . HIP SURGERY     4 right hip surgeries  . LAPAROSCOPIC APPENDECTOMY  05/07/2012   Procedure: APPENDECTOMY LAPAROSCOPIC;  Surgeon: Pedro Earls, MD;  Location: [facility] ORS;  Service: General;  Laterality: N/A;  . TUBAL LIGATION      ROS- all systems are reviewed and negatives except as per HPI above  Current Outpatient Medications  Medication Sig Dispense Refill  . acetaminophen (TYLENOL) 500 MG tablet Take 1,000 mg by mouth every 8 (eight) hours as needed for mild pain or headache.    Marland Kitchen amoxicillin  (AMOXIL) 500 MG tablet Take 500 mg by mouth. Prior to dental procedures and appointments only    . atenolol (TENORMIN) 50 MG tablet Take 1 tablet (50 mg total) by mouth daily.    . Calcium Carb-Cholecalciferol (CALCIUM/VITAMIN D) 600-400 MG-UNIT TABS Take 1 tablet by mouth daily.    Marland Kitchen diltiazem (CARDIZEM LA) 180 MG 24 hr tablet Take 180 mg by mouth daily as needed.    Marland Kitchen ELIQUIS 5 MG TABS tablet Take 5 mg by mouth 2 (two) times daily.     . flecainide (TAMBOCOR) 100 MG tablet Take 100 mg by mouth 2 (two) times daily.    Marland Kitchen gabapentin (NEURONTIN) 300 MG capsule Take 1 capsule (300 mg total) by mouth 3 (three) times daily. (Patient taking differently: Take 300-600 mg by mouth 2 (two) times daily. Take 1 capsule  in the morning and 2 capsules at bedtime) 270 capsule 3  . ibandronate (BONIVA) 150 MG tablet Take 1 tablet by mouth every 30 (thirty) days.    . Multiple Vitamins-Minerals (ICAPS AREDS 2 PO) Take 1 tablet by mouth daily.    . promethazine (PHENERGAN) 25 MG tablet TAKE 1 TABLET (25 MG TOTAL) BY MOUTH EVERY 8 (EIGHT) HOURS AS NEEDED FOR NAUSEA. (Patient taking differently: Take 25 mg by mouth every 8 (eight) hours as needed. TAKE 1 TABLET (25 MG TOTAL) BY MOUTH EVERY 8 (EIGHT) HOURS AS NEEDED FOR NAUSEA.) 30 tablet 3  . SUMAtriptan (IMITREX) 100 MG tablet TAKE 1 TABLET (100 MG TOTAL) BY MOUTH EVERY 2 (  TWO) HOURS AS NEEDED. (Patient taking differently: Take 100 mg by mouth every 2 (two) hours as needed for migraine. ) 10 tablet 3   No current facility-administered medications for this visit.     Physical Exam: Vitals:   01/20/18 1123  BP: 112/72  Pulse: (!) 103  Weight: 145 lb (65.8 kg)  Height: 5\' 3"  (1.6 m)    GEN- The patient is well appearing, alert and oriented x 3 today.   Head- normocephalic, atraumatic Eyes-  Sclera clear, conjunctiva pink Ears- hearing intact Oropharynx- clear Lungs- Clear to ausculation bilaterally, normal work of breathing Heart- irregular rate and rhythm,  no murmurs, rubs or gallops, PMI not laterally displaced GI- soft, NT, ND, + BS Extremities- no clubbing, cyanosis, or edema  EKG tracing ordered today is personally reviewed and shows atrial flutter, nonspecific ST/T changes  Assessment and Plan:  1. Persistent atrial fibrillation/atrial flutter Now in atrial flutter She has had recurrence post ablation Therapeutic strategies for afib/ atrial flutter including medicine and ablation were discussed in detail with the patient today. Risk, benefits, and alternatives to EP study and radiofrequency ablation for afib were also discussed in detail today. These risks include but are not limited to stroke, bleeding, vascular damage, tamponade, perforation, damage to the esophagus, lungs, and other structures, pulmonary vein stenosis, worsening renal function, and death. The patient understands these risk and wishes to proceed.  We will therefore proceed with catheter ablation at the next available time.  Will obtain cardiac CT prior to ablation to exclude LAA thrombus. chads2vasc score is 2.  She is on eliquis.   Thompson Grayer MD, [facility] 01/20/2018 11:56 AM

## 2018-01-20 NOTE — Progress Notes (Signed)
PCP: Leighton Ruff, MD Primary Cardiologist: Dr Wynonia Lawman Primary EP: Dr Rayann Heman  Christina [name] is a 67 y.o. female who presents today for routine electrophysiology followup.  Since last being seen in our clinic, the patient reports doing reasonably well.  She continues to have atrial arrhythmias.  She underwent cardioversion 12/02/17 but unfortunately has returned to atrial flutter.  She has fatigue and palpitations.  Today, she denies symptoms of chest pain, shortness of breath,  lower extremity edema, dizziness, presyncope, or syncope.  The patient is otherwise without complaint today.   Past Medical History:  Diagnosis Date  . Appendicitis, acute 06/01/2012  . Current use of long term anticoagulation 07/07/2017  . Headache   . Palpitations 08/30/2014   Seen by Dr. Tollie Eth, had a holter 08/2014   . Paroxysmal atrial fibrillation ([place]) 09/04/2014   Dr. Wynonia Lawman. Started eliquis 08/2014    Past Surgical History:  Procedure Laterality Date  . ACHILLES TENDON SURGERY     left  . ATRIAL FIBRILLATION ABLATION N/A 08/18/2017   Procedure: Atrial Fibrillation Ablation;  Surgeon: Thompson Grayer, MD;  Location: [place] CV LAB;  Service: Cardiovascular;  Laterality: N/A;  . CARDIOVERSION N/A 12/02/2017   Procedure: CARDIOVERSION;  Surgeon: Jacolyn Reedy, MD;  Location: [facility] ENDOSCOPY;  Service: Cardiovascular;  Laterality: N/A;  . FINGER SURGERY     left middle finger - pin placed  . HIP SURGERY     4 right hip surgeries  . LAPAROSCOPIC APPENDECTOMY  05/07/2012   Procedure: APPENDECTOMY LAPAROSCOPIC;  Surgeon: Pedro Earls, MD;  Location: [facility] ORS;  Service: General;  Laterality: N/A;  . TUBAL LIGATION      ROS- all systems are reviewed and negatives except as per HPI above  Current Outpatient Medications  Medication Sig Dispense Refill  . acetaminophen (TYLENOL) 500 MG tablet Take 1,000 mg by mouth every 8 (eight) hours as needed for mild pain or headache.    Marland Kitchen amoxicillin  (AMOXIL) 500 MG tablet Take 500 mg by mouth. Prior to dental procedures and appointments only    . atenolol (TENORMIN) 50 MG tablet Take 1 tablet (50 mg total) by mouth daily.    . Calcium Carb-Cholecalciferol (CALCIUM/VITAMIN D) 600-400 MG-UNIT TABS Take 1 tablet by mouth daily.    Marland Kitchen diltiazem (CARDIZEM LA) 180 MG 24 hr tablet Take 180 mg by mouth daily as needed.    Marland Kitchen ELIQUIS 5 MG TABS tablet Take 5 mg by mouth 2 (two) times daily.     . flecainide (TAMBOCOR) 100 MG tablet Take 100 mg by mouth 2 (two) times daily.    Marland Kitchen gabapentin (NEURONTIN) 300 MG capsule Take 1 capsule (300 mg total) by mouth 3 (three) times daily. (Patient taking differently: Take 300-600 mg by mouth 2 (two) times daily. Take 1 capsule  in the morning and 2 capsules at bedtime) 270 capsule 3  . ibandronate (BONIVA) 150 MG tablet Take 1 tablet by mouth every 30 (thirty) days.    . Multiple Vitamins-Minerals (ICAPS AREDS 2 PO) Take 1 tablet by mouth daily.    . promethazine (PHENERGAN) 25 MG tablet TAKE 1 TABLET (25 MG TOTAL) BY MOUTH EVERY 8 (EIGHT) HOURS AS NEEDED FOR NAUSEA. (Patient taking differently: Take 25 mg by mouth every 8 (eight) hours as needed. TAKE 1 TABLET (25 MG TOTAL) BY MOUTH EVERY 8 (EIGHT) HOURS AS NEEDED FOR NAUSEA.) 30 tablet 3  . SUMAtriptan (IMITREX) 100 MG tablet TAKE 1 TABLET (100 MG TOTAL) BY MOUTH EVERY 2 (  TWO) HOURS AS NEEDED. (Patient taking differently: Take 100 mg by mouth every 2 (two) hours as needed for migraine. ) 10 tablet 3   No current facility-administered medications for this visit.     Physical Exam: Vitals:   01/20/18 1123  BP: 112/72  Pulse: (!) 103  Weight: 145 lb (65.8 kg)  Height: 5\' 3"  (1.6 m)    GEN- The patient is well appearing, alert and oriented x 3 today.   Head- normocephalic, atraumatic Eyes-  Sclera clear, conjunctiva pink Ears- hearing intact Oropharynx- clear Lungs- Clear to ausculation bilaterally, normal work of breathing Heart- irregular rate and rhythm,  no murmurs, rubs or gallops, PMI not laterally displaced GI- soft, NT, ND, + BS Extremities- no clubbing, cyanosis, or edema  EKG tracing ordered today is personally reviewed and shows atrial flutter, nonspecific ST/T changes  Assessment and Plan:  1. Persistent atrial fibrillation/atrial flutter Now in atrial flutter She has had recurrence post ablation Therapeutic strategies for afib/ atrial flutter including medicine and ablation were discussed in detail with the patient today. Risk, benefits, and alternatives to EP study and radiofrequency ablation for afib were also discussed in detail today. These risks include but are not limited to stroke, bleeding, vascular damage, tamponade, perforation, damage to the esophagus, lungs, and other structures, pulmonary vein stenosis, worsening renal function, and death. The patient understands these risk and wishes to proceed.  We will therefore proceed with catheter ablation at the next available time.  Will obtain cardiac CT prior to ablation to exclude LAA thrombus. chads2vasc score is 2.  She is on eliquis.   Thompson Grayer MD, [facility] 01/20/2018 11:56 AM

## 2018-02-02 ENCOUNTER — Other Ambulatory Visit: Payer: Medicare Other

## 2018-02-02 DIAGNOSIS — I481 Persistent atrial fibrillation: Secondary | ICD-10-CM | POA: Diagnosis not present

## 2018-02-02 DIAGNOSIS — I4819 Other persistent atrial fibrillation: Secondary | ICD-10-CM

## 2018-02-03 LAB — BASIC METABOLIC PANEL
BUN/Creatinine Ratio: 20 (ref 12–28)
BUN: 17 mg/dL (ref 8–27)
CO2: 24 mmol/L (ref 20–29)
Calcium: 9.6 mg/dL (ref 8.7–10.3)
Chloride: 106 mmol/L (ref 96–106)
Creatinine, Ser: 0.85 mg/dL (ref 0.57–1.00)
GFR calc Af Amer: 81 mL/min/{1.73_m2} (ref >59)
GFR calc non Af Amer: 71 mL/min/{1.73_m2} (ref >59)
Glucose: 82 mg/dL (ref 65–99)
Potassium: 4.9 mmol/L (ref 3.5–5.2)
Sodium: 147 mmol/L — ABNORMAL HIGH (ref 134–144)

## 2018-02-03 LAB — CBC WITH DIFFERENTIAL/PLATELET
Basophils Absolute: 0 10*3/uL (ref 0.0–0.2)
Basos: 1 %
EOS (ABSOLUTE): 0.1 10*3/uL (ref 0.0–0.4)
Eos: 2 %
Hematocrit: 41.2 % (ref 34.0–46.6)
Hemoglobin: 13.4 g/dL (ref 11.1–15.9)
Immature Grans (Abs): 0 10*3/uL (ref 0.0–0.1)
Immature Granulocytes: 0 %
Lymphocytes Absolute: 3 10*3/uL (ref 0.7–3.1)
Lymphs: 40 %
MCH: 31 pg (ref 26.6–33.0)
MCHC: 32.5 g/dL (ref 31.5–35.7)
MCV: 95 fL (ref 79–97)
Monocytes Absolute: 0.5 10*3/uL (ref 0.1–0.9)
Monocytes: 7 %
Neutrophils Absolute: 3.9 10*3/uL (ref 1.4–7.0)
Neutrophils: 50 %
Platelets: 286 10*3/uL (ref 150–379)
RBC: 4.32 x10E6/uL (ref 3.77–5.28)
RDW: 13.9 % (ref 12.3–15.4)
WBC: 7.6 10*3/uL (ref 3.4–10.8)

## 2018-02-08 ENCOUNTER — Ambulatory Visit (HOSPITAL_COMMUNITY): Payer: Medicare Other

## 2018-02-08 ENCOUNTER — Ambulatory Visit (HOSPITAL_COMMUNITY)
Admission: RE | Admit: 2018-02-08 | Discharge: 2018-02-08 | Disposition: A | Discharge status: Home / Self Care | Payer: Medicare Other | Source: Ambulatory Visit | Attending: Internal Medicine | Admitting: Internal Medicine

## 2018-02-08 DIAGNOSIS — R918 Other nonspecific abnormal finding of lung field: Secondary | ICD-10-CM | POA: Diagnosis not present

## 2018-02-08 DIAGNOSIS — I481 Persistent atrial fibrillation: Secondary | ICD-10-CM | POA: Insufficient documentation

## 2018-02-08 DIAGNOSIS — I4819 Other persistent atrial fibrillation: Secondary | ICD-10-CM

## 2018-02-08 DIAGNOSIS — I700 Atherosclerosis of aorta: Secondary | ICD-10-CM | POA: Insufficient documentation

## 2018-02-08 DIAGNOSIS — J9811 Atelectasis: Secondary | ICD-10-CM | POA: Diagnosis not present

## 2018-02-08 DIAGNOSIS — I4891 Unspecified atrial fibrillation: Secondary | ICD-10-CM | POA: Diagnosis not present

## 2018-02-08 MED ORDER — DILTIAZEM HCL 25 MG/5ML IV SOLN
10.0000 mg | Freq: Once | INTRAVENOUS | Status: AC
  Administered 2018-02-08: 10 mg via INTRAVENOUS
  Filled 2018-02-08 (×2): qty 5

## 2018-02-08 MED ORDER — IOPAMIDOL (ISOVUE-370) INJECTION 76%
INTRAVENOUS | Status: AC
  Administered 2018-02-08: 80 mL via INTRAVENOUS
  Filled 2018-02-08: qty 100

## 2018-02-08 NOTE — Progress Notes (Signed)
CT complete.  Patient back in room with no complaints at this time. Eating crackers and given drink. VSS.

## 2018-02-08 NOTE — Progress Notes (Signed)
Patient ambulated out of department alone with steady gait. NAD noted.

## 2018-02-08 NOTE — Progress Notes (Signed)
Writer spoke to Amy, Therapist, sports at [employer] due to Dr. Aundra Dubin in sterile procedure.  Made MD aware of patients vital signs. 106 HR, 104/81 BP. Verbal order obtained to give patient Diltizem 10mg  IVP once. Also change study to Pulmonary Vein study. Orders followed through.   Patient updated.

## 2018-02-11 ENCOUNTER — Ambulatory Visit (HOSPITAL_COMMUNITY)
Admission: RE | Admit: 2018-02-11 | Discharge: 2018-02-12 | Disposition: A | Discharge status: Home / Self Care | Payer: Medicare Other | Source: Ambulatory Visit | Attending: Internal Medicine | Admitting: Internal Medicine

## 2018-02-11 ENCOUNTER — Ambulatory Visit (HOSPITAL_COMMUNITY): Payer: Medicare Other | Admitting: Certified Registered"

## 2018-02-11 ENCOUNTER — Other Ambulatory Visit: Payer: Self-pay

## 2018-02-11 ENCOUNTER — Encounter (HOSPITAL_COMMUNITY)
Admission: RE | Disposition: A | Discharge status: Home / Self Care | Payer: Self-pay | Source: Ambulatory Visit | Attending: Internal Medicine

## 2018-02-11 ENCOUNTER — Encounter (HOSPITAL_COMMUNITY): Payer: Self-pay | Admitting: General Practice

## 2018-02-11 DIAGNOSIS — Z7901 Long term (current) use of anticoagulants: Secondary | ICD-10-CM | POA: Diagnosis not present

## 2018-02-11 DIAGNOSIS — I484 Atypical atrial flutter: Secondary | ICD-10-CM | POA: Diagnosis not present

## 2018-02-11 DIAGNOSIS — I481 Persistent atrial fibrillation: Secondary | ICD-10-CM | POA: Diagnosis not present

## 2018-02-11 DIAGNOSIS — Z885 Allergy status to narcotic agent status: Secondary | ICD-10-CM | POA: Insufficient documentation

## 2018-02-11 DIAGNOSIS — I4891 Unspecified atrial fibrillation: Secondary | ICD-10-CM | POA: Diagnosis not present

## 2018-02-11 DIAGNOSIS — I10 Essential (primary) hypertension: Secondary | ICD-10-CM | POA: Insufficient documentation

## 2018-02-11 DIAGNOSIS — I4819 Other persistent atrial fibrillation: Secondary | ICD-10-CM | POA: Diagnosis present

## 2018-02-11 HISTORY — DX: Migraine, unspecified, not intractable, without status migrainosus: G43.909

## 2018-02-11 HISTORY — DX: Basal cell carcinoma of skin, unspecified: C44.91

## 2018-02-11 HISTORY — DX: Personal history of other medical treatment: Z92.89

## 2018-02-11 HISTORY — PX: ATRIAL FIBRILLATION ABLATION: EP1191

## 2018-02-11 LAB — POCT ACTIVATED CLOTTING TIME
Activated Clotting Time: 296 seconds
Activated Clotting Time: 340 seconds
Activated Clotting Time: 389 seconds
Activated Clotting Time: 356 seconds
Activated Clotting Time: 362 seconds
Activated Clotting Time: 329 seconds
Activated Clotting Time: 186 seconds
Activated Clotting Time: 202 seconds

## 2018-02-11 SURGERY — ATRIAL FIBRILLATION ABLATION
Anesthesia: Monitor Anesthesia Care

## 2018-02-11 MED ORDER — PROTAMINE SULFATE 10 MG/ML IV SOLN
INTRAVENOUS | Status: DC | PRN
  Administered 2018-02-11: 30 mg via INTRAVENOUS

## 2018-02-11 MED ORDER — IOPAMIDOL (ISOVUE-370) INJECTION 76%
INTRAVENOUS | Status: AC
  Filled 2018-02-11: qty 50

## 2018-02-11 MED ORDER — OFF THE BEAT BOOK
Freq: Once | Status: AC
  Administered 2018-02-11: 21:00:00 1
  Filled 2018-02-11: qty 1

## 2018-02-11 MED ORDER — LIDOCAINE 2% (20 MG/ML) 5 ML SYRINGE
INTRAMUSCULAR | Status: DC | PRN
  Administered 2018-02-11: 20 mg via INTRAVENOUS

## 2018-02-11 MED ORDER — SUMATRIPTAN SUCCINATE 100 MG PO TABS: 100.0000 mg | ORAL_TABLET | ORAL | Status: DC | PRN

## 2018-02-11 MED ORDER — PHENYLEPHRINE HCL 10 MG/ML IJ SOLN
INTRAVENOUS | Status: DC | PRN
  Administered 2018-02-11: 20 ug/min via INTRAVENOUS

## 2018-02-11 MED ORDER — PROPOFOL 10 MG/ML IV BOLUS
INTRAVENOUS | Status: DC | PRN
  Administered 2018-02-11: 100 mg via INTRAVENOUS

## 2018-02-11 MED ORDER — PHENYLEPHRINE 40 MCG/ML (10ML) SYRINGE FOR IV PUSH (FOR BLOOD PRESSURE SUPPORT)
PREFILLED_SYRINGE | INTRAVENOUS | Status: DC | PRN
  Administered 2018-02-11 (×5): 80 ug via INTRAVENOUS

## 2018-02-11 MED ORDER — LIDOCAINE HCL (PF) 1 % IJ SOLN: INTRAMUSCULAR | Status: DC | PRN

## 2018-02-11 MED ORDER — HEPARIN (PORCINE) IN NACL 2-0.9 UNIT/ML-% IJ SOLN
INTRAMUSCULAR | Status: AC
  Filled 2018-02-11: qty 500

## 2018-02-11 MED ORDER — BUPIVACAINE HCL (PF) 0.25 % IJ SOLN
INTRAMUSCULAR | Status: DC | PRN
  Administered 2018-02-11: 30 mL

## 2018-02-11 MED ORDER — SODIUM CHLORIDE 0.9 % IV SOLN
INTRAVENOUS | Status: DC
  Administered 2018-02-11: 10:00:00 via INTRAVENOUS

## 2018-02-11 MED ORDER — SODIUM CHLORIDE 0.9% FLUSH
3.0000 mL | Freq: Two times a day (BID) | INTRAVENOUS | Status: DC
  Administered 2018-02-11: 21:00:00 3 mL via INTRAVENOUS

## 2018-02-11 MED ORDER — FENTANYL CITRATE (PF) 250 MCG/5ML IJ SOLN
INTRAMUSCULAR | Status: DC | PRN
  Administered 2018-02-11: 100 ug via INTRAVENOUS

## 2018-02-11 MED ORDER — GABAPENTIN 300 MG PO CAPS
300.0000 mg | ORAL_CAPSULE | Freq: Three times a day (TID) | ORAL | Status: DC
  Administered 2018-02-11 – 2018-02-12 (×3): 300 mg via ORAL
  Filled 2018-02-11 (×3): qty 1

## 2018-02-11 MED ORDER — APIXABAN 5 MG PO TABS
5.0000 mg | ORAL_TABLET | Freq: Two times a day (BID) | ORAL | Status: DC
  Administered 2018-02-11 – 2018-02-12 (×2): 5 mg via ORAL
  Filled 2018-02-11 (×2): qty 1

## 2018-02-11 MED ORDER — ADENOSINE 6 MG/2ML IV SOLN
INTRAVENOUS | Status: DC | PRN
  Administered 2018-02-11 (×2): 12 mg via INTRAVENOUS

## 2018-02-11 MED ORDER — SODIUM CHLORIDE 0.9 % IV SOLN
INTRAVENOUS | Status: DC | PRN
  Administered 2018-02-11 (×2): via INTRAVENOUS

## 2018-02-11 MED ORDER — HEPARIN SODIUM (PORCINE) 1000 UNIT/ML IJ SOLN
INTRAMUSCULAR | Status: DC | PRN
  Administered 2018-02-11 (×2): 1000 [IU] via INTRAVENOUS

## 2018-02-11 MED ORDER — LORAZEPAM 0.5 MG PO TABS
0.5000 mg | ORAL_TABLET | Freq: Every evening | ORAL | Status: DC | PRN
  Administered 2018-02-11: 21:00:00 0.5 mg via ORAL
  Filled 2018-02-11: qty 1

## 2018-02-11 MED ORDER — HEPARIN (PORCINE) IN NACL 2-0.9 UNIT/ML-% IJ SOLN
INTRAMUSCULAR | Status: AC | PRN
  Administered 2018-02-11: 500 mL

## 2018-02-11 MED ORDER — SODIUM CHLORIDE 0.9% FLUSH: 3.0000 mL | INTRAVENOUS | Status: DC | PRN

## 2018-02-11 MED ORDER — SUGAMMADEX SODIUM 200 MG/2ML IV SOLN
INTRAVENOUS | Status: DC | PRN
  Administered 2018-02-11: 200 mg via INTRAVENOUS

## 2018-02-11 MED ORDER — ROCURONIUM BROMIDE 10 MG/ML (PF) SYRINGE
PREFILLED_SYRINGE | INTRAVENOUS | Status: DC | PRN
  Administered 2018-02-11: 10 mg via INTRAVENOUS
  Administered 2018-02-11: 50 mg via INTRAVENOUS
  Administered 2018-02-11: 10 mg via INTRAVENOUS
  Administered 2018-02-11: 20 mg via INTRAVENOUS

## 2018-02-11 MED ORDER — BUPIVACAINE HCL (PF) 0.25 % IJ SOLN
INTRAMUSCULAR | Status: AC
  Filled 2018-02-11: qty 30

## 2018-02-11 MED ORDER — HEPARIN SODIUM (PORCINE) 1000 UNIT/ML IJ SOLN
INTRAMUSCULAR | Status: AC
  Filled 2018-02-11: qty 1

## 2018-02-11 MED ORDER — ONDANSETRON HCL 4 MG/2ML IJ SOLN: 4.0000 mg | Freq: Four times a day (QID) | INTRAMUSCULAR | Status: DC | PRN

## 2018-02-11 MED ORDER — HEPARIN SODIUM (PORCINE) 1000 UNIT/ML IJ SOLN
INTRAMUSCULAR | Status: DC | PRN
  Administered 2018-02-11: 12000 [IU] via INTRAVENOUS
  Administered 2018-02-11: 2000 [IU] via INTRAVENOUS
  Administered 2018-02-11: 3000 [IU] via INTRAVENOUS
  Administered 2018-02-11: 1000 [IU] via INTRAVENOUS
  Administered 2018-02-11: 2000 [IU] via INTRAVENOUS

## 2018-02-11 MED ORDER — IOPAMIDOL (ISOVUE-370) INJECTION 76%
INTRAVENOUS | Status: DC | PRN
  Administered 2018-02-11: 3 mL via INTRAVENOUS

## 2018-02-11 MED ORDER — HYDROCODONE-ACETAMINOPHEN 5-325 MG PO TABS
1.0000 | ORAL_TABLET | ORAL | Status: DC | PRN
Start: 2018-02-11 — End: 2018-02-12
  Administered 2018-02-11 – 2018-02-12 (×2): 1 via ORAL
  Filled 2018-02-11 (×2): qty 1

## 2018-02-11 MED ORDER — ACETAMINOPHEN 325 MG PO TABS: 650.0000 mg | ORAL_TABLET | ORAL | Status: DC | PRN

## 2018-02-11 MED ORDER — ONDANSETRON HCL 4 MG/2ML IJ SOLN
INTRAMUSCULAR | Status: DC | PRN
  Administered 2018-02-11: 4 mg via INTRAVENOUS

## 2018-02-11 MED ORDER — DEXAMETHASONE SODIUM PHOSPHATE 10 MG/ML IJ SOLN
INTRAMUSCULAR | Status: DC | PRN
  Administered 2018-02-11: 10 mg via INTRAVENOUS

## 2018-02-11 MED ORDER — ADENOSINE 6 MG/2ML IV SOLN
INTRAVENOUS | Status: AC
  Filled 2018-02-11: qty 8

## 2018-02-11 MED ORDER — MIDAZOLAM HCL 2 MG/2ML IJ SOLN
INTRAMUSCULAR | Status: DC | PRN
  Administered 2018-02-11: 2 mg via INTRAVENOUS

## 2018-02-11 MED ORDER — SODIUM CHLORIDE 0.9 % IV SOLN: 250.0000 mL | INTRAVENOUS | Status: DC | PRN

## 2018-02-11 SURGICAL SUPPLY — 17 items
BLANKET WARM UNDERBOD FULL ACC (MISCELLANEOUS) ×2 IMPLANT
CATH MAPPNG PENTARAY F 2-6-2MM (CATHETERS) ×1 IMPLANT
CATH NAVISTAR SMARTTOUCH DF (ABLATOR) ×2 IMPLANT
CATH SOUNDSTAR ECO REPROCESSED (CATHETERS) ×2 IMPLANT
CATH WEBSTER BI DIR CS D-F CRV (CATHETERS) ×2 IMPLANT
COVER SWIFTLINK CONNECTOR (BAG) ×2 IMPLANT
NEEDLE TRANSEP BRK 71CM 407200 (NEEDLE) ×2 IMPLANT
PACK EP LATEX FREE (CUSTOM PROCEDURE TRAY) ×1
PACK EP LF (CUSTOM PROCEDURE TRAY) ×1 IMPLANT
PAD DEFIB LIFELINK (PAD) ×2 IMPLANT
PATCH CARTO3 (PAD) ×2 IMPLANT
PENTARAY F 2-6-2MM (CATHETERS) ×2
SHEATH AVANTI 11CM 7FR (SHEATH) ×4 IMPLANT
SHEATH AVANTI 11CM 9FR (SHEATH) ×2 IMPLANT
SHEATH AVANTI 11F 11CM (SHEATH) ×2 IMPLANT
SHEATH SWARTZ TS SL2 63CM 8.5F (SHEATH) ×2 IMPLANT
TUBING SMART ABLATE COOLFLOW (TUBING) ×2 IMPLANT

## 2018-02-11 NOTE — Interval H&P Note (Signed)
History and Physical Interval Note:  02/11/2018 10:52 AM  Christina [name]  has presented today for surgery, with the diagnosis of Afib  The various methods of treatment have been discussed with the patient and family. After consideration of risks, benefits and other options for treatment, the patient has consented to  Procedure(s): ATRIAL FIBRILLATION ABLATION (N/A) as a surgical intervention .  The patient's history has been reviewed, patient examined, no change in status, stable for surgery.  I have reviewed the patient's chart and labs.  Questions were answered to the patient's satisfaction.     Thompson Grayer

## 2018-02-11 NOTE — Discharge Instructions (Signed)
Post procedure care instructions °No driving for 4 days. No lifting over 5 lbs for 1 week. No vigorous or sexual activity for 1 week. You may return to work in one week. Keep procedure site clean & dry. If you notice increased pain, swelling, bleeding or pus, call/return!  You may shower, but no soaking baths/hot tubs/pools for 1 week.  ° ° ° ° °You have an appointment set up with the Atrial Fibrillation Clinic.  Multiple studies have shown that being followed by a dedicated atrial fibrillation clinic in addition to the standard care you receive from your other physicians improves health. We believe that enrollment in the atrial fibrillation clinic will allow us to better care for you.  ° °The phone number to the Atrial Fibrillation Clinic is [phone]. The clinic is staffed Monday through Friday from 8:30am to 5pm. ° °Parking Directions: The clinic is located in the Heart and Vascular Building connected to [facility]. °1)From [address] turn on to [address] and go to the 3rd entrance  (Heart and Vascular entrance) on the right. °2)Look to the right for Heart &Vascular Parking Garage. °3)A code for the entrance is required please call the clinic to receive this.   °4)Take the elevators to the 1st floor. Registration is in the room with the glass walls at the end of the hallway. ° °If you have any trouble parking or locating the clinic, please don’t hesitate to call [phone]. °

## 2018-02-11 NOTE — Anesthesia Postprocedure Evaluation (Signed)
Anesthesia Post Note  Patient: Christina [name]  Procedure(s) Performed: ATRIAL FIBRILLATION ABLATION (N/A )     Patient location during evaluation: Cath Lab Anesthesia Type: General Level of consciousness: sedated Pain management: pain level controlled Vital Signs Assessment: post-procedure vital signs reviewed and stable Respiratory status: spontaneous breathing Cardiovascular status: stable Postop Assessment: no apparent nausea or vomiting Anesthetic complications: no    Last Vitals:  Vitals:   02/11/18 1546 02/11/18 1550  BP:  (!) 106/46  Pulse:  71  Resp:  14  Temp: 36.6 C   SpO2:  97%    Last Pain:  Vitals:   02/11/18 1546  TempSrc: Temporal  PainSc: 0-No pain   Pain Goal: Patients Stated Pain Goal: 3 (02/11/18 1010)               Blaise Palladino JR,JOHN [facility]

## 2018-02-11 NOTE — Anesthesia Procedure Notes (Signed)
Procedure Name: Intubation Date/Time: 02/11/2018 12:02 PM Performed by: Teressa Lower., CRNA Pre-anesthesia Checklist: Patient identified, Emergency Drugs available, Suction available and Patient being monitored Patient Re-evaluated:Patient Re-evaluated prior to induction Oxygen Delivery Method: Circle system utilized Preoxygenation: Pre-oxygenation with 100% oxygen Induction Type: IV induction Ventilation: Mask ventilation without difficulty Laryngoscope Size: Glidescope and 3 Grade View: Grade I Tube type: Oral Tube size: 7.0 mm Number of attempts: 1 Airway Equipment and Method: Stylet and Oral airway Placement Confirmation: ETT inserted through vocal cords under direct vision,  positive ETCO2 and breath sounds checked- equal and bilateral Secured at: 22 cm Tube secured with: Tape Dental Injury: Teeth and Oropharynx as per pre-operative assessment  Difficulty Due To: Difficult Airway- due to limited oral opening

## 2018-02-11 NOTE — Transfer of Care (Signed)
Immediate Anesthesia Transfer of Care Note  Patient: Christina [name]  Procedure(s) Performed: ATRIAL FIBRILLATION ABLATION (N/A )  Patient Location: Cath Lab  Anesthesia Type:General  Level of Consciousness: awake, alert  and oriented  Airway & Oxygen Therapy: Patient Spontanous Breathing  Post-op Assessment: Report given to RN and Post -op Vital signs reviewed and stable  Post vital signs: Reviewed and stable  Last Vitals:  Vitals Value Taken Time  BP    Temp    Pulse 74 02/11/2018  3:44 PM  Resp 17 02/11/2018  3:44 PM  SpO2 94 % 02/11/2018  3:44 PM  Vitals shown include unvalidated device data.  Last Pain:  Vitals:   02/11/18 1010  TempSrc:   PainSc: 0-No pain      Patients Stated Pain Goal: 3 (21/19/41 7408)  Complications: No apparent anesthesia complications

## 2018-02-11 NOTE — Discharge Summary (Addendum)
ELECTROPHYSIOLOGY PROCEDURE DISCHARGE SUMMARY    Patient ID: Christina [name],  MRN: [id number], DOB/AGE: 12-13-1950 67 y.o.  Admit date: 02/11/2018 Discharge date: 02/12/18  Primary Care Physician: Leighton Ruff, MD  Primary Cardiologist: Dr. Wynonia Lawman Electrophysiologist: Thompson Grayer, MD  Primary Discharge Diagnosis:  1. Persistent AFib 2. Atrial flutter     CHA2DS2Vasc is 2, on Eliquis  Secondary Discharge Diagnosis:  none  Procedures This Admission:  1.  Electrophysiology study and radiofrequency catheter ablation on 02/11/18 by Dr Thompson Grayer.  This study demonstrated   CONCLUSIONS: 1. Atrial fibrillation upon presentation.   2. Intracardiac echo reveals a moderate sized left atrium with four separate pulmonary veins without evidence of pulmonary vein stenosis. 3. The left superior, left inferior, and right superior pulmonary veins were quiescent from the prior ablation and did not require ablation.  There was conduction within the right inferior pulmonary vein.  This vein was successfully reisolated today 4. Additional left atrial ablation was performed with a standard box lesion created along the posterior wall of the left atrium 5. Left  Atrial flutter observed.  Mitral isthmus ablation performed between the RSPV and mitral valve annulus as well as between the LIPV and mitral valve annulus as well as in the coronary sinus.  Atrial flutter appeared to arise from within the left atrial appendage.  Ablation at the base of the appendage was not successful in terminating atrial flutter 4. Atrial flutter successfully cardioverted to sinus rhythm. 5. CTi block demonstrated 6. No early apparent complications.   Brief HPI: Christina [name] is a 67 y.o. female with a history of persistent atrial fibrillation, atrial flutter.  She has had prior ablation and failed medical therapy with felcainide. Risks, benefits, and alternatives to catheter ablation of atrial fibrillation were  reviewed with the patient who wished to proceed.  The patient underwent cardiac CT prior to the procedure which demonstrated no LAA thrombus.    Hospital Course:  The patient was admitted and underwent EPS/RFCA of atrial fibrillation with details as outlined above.  The patient was monitored on telemetry overnight which demonstrated SR.  R groin was without complication on the day of discharge.  The patient feels well the day of discharge, no CP, palpitations, minimal site discomfort, she was examined by Dr. Curt Bears and considered to be stable for discharge.  Wound care and restrictions were reviewed with the patient.  The  the patient Fahim Kats be seen back by Roderic Palau, NP in 4 weeks and Dr Rayann Heman in 12 weeks for post ablation follow up.     Physical Exam: Vitals:   02/11/18 1927 02/11/18 1930 02/11/18 2000 02/12/18 0336  BP: 113/62   (!) 95/33  Pulse: 80 77 73 66  Resp: 18 20 (!) 21 14  Temp: (!) 97.3 F (36.3 C)   (!) 97.4 F (36.3 C)  TempSrc: Oral   Oral  SpO2: 96% 97% 95% 93%  Weight:    158 lb 15.2 oz (72.1 kg)  Height:        GEN- The patient is well appearing, alert and oriented x 3 today.   HEENT: normocephalic, atraumatic; sclera clear, conjunctiva pink; hearing intact; oropharynx clear; neck supple  Lungs- CTA b/l, normal work of breathing.  No wheezes, rales, rhonchi Heart- RRR, no murmurs, rubs or gallops  GI- soft, non-tender, non-distended Extremities- no clubbing, cyanosis, or edema; R DP/PT pulses 2+ bilaterally, R groin without hematoma/bruit MS- no significant deformity or atrophy Skin- warm and dry, no rash or lesion  Psych- euthymic mood, full affect Neuro- strength and sensation are intact   Labs:   Lab Results  Component Value Date   WBC 7.6 02/02/2018   HGB 13.4 02/02/2018   HCT 41.2 02/02/2018   MCV 95 02/02/2018   PLT 286 02/02/2018   No results for input(s): NA, K, CL, CO2, BUN, CREATININE, CALCIUM, PROT, BILITOT, ALKPHOS, ALT, AST, GLUCOSE in the  last 168 hours.  Invalid input(s): LABALBU   Discharge Medications:  Allergies as of 02/12/2018      Reactions   Oxycontin [oxycodone Hcl] Nausea Only   Metoprolol Nausea Only   Propranolol Nausea Only      Medication List    TAKE these medications   acetaminophen 500 MG tablet Commonly known as:  TYLENOL Take 1,000 mg by mouth every 8 (eight) hours as needed for mild pain or headache.   amoxicillin 500 MG tablet Commonly known as:  AMOXIL Take 2,000 mg by mouth See admin instructions. Take 2000 mg by mouth 1 hour prior to dental procedure   atenolol 50 MG tablet Commonly known as:  TENORMIN Take 1 tablet (50 mg total) by mouth daily. What changed:  when to take this Notes to patient:  Continue as you were taking without changes   CALCIUM 600/VITAMIN D3 600-800 MG-UNIT Tabs Generic drug:  Calcium Carb-Cholecalciferol Take 1 tablet by mouth daily.   diltiazem 180 MG 24 hr tablet Commonly known as:  CARDIZEM LA Take 180 mg by mouth daily as needed (for migraines).   ELIQUIS 5 MG Tabs tablet Generic drug:  apixaban Take 5 mg by mouth 2 (two) times daily.   flecainide 100 MG tablet Commonly known as:  TAMBOCOR Take 50-100 mg by mouth See admin instructions. Take 100 mg by mouth in the morning and take 50 mg by mouth in the evening   gabapentin 300 MG capsule Commonly known as:  NEURONTIN Take 1 capsule (300 mg total) by mouth 3 (three) times daily. What changed:    how much to take  when to take this  additional instructions   ibandronate 150 MG tablet Commonly known as:  BONIVA Take 1 tablet by mouth every 30 (thirty) days.   pantoprazole 40 MG tablet Commonly known as:  PROTONIX Take 1 tablet (40 mg total) by mouth daily. Notes to patient:  If you find you need a refill on this medicine or do not have enough supply at home to complete 6 weeks, please call the office   PRESERVISION AREDS 2 PO Take 1 capsule by mouth daily.   promethazine 25 MG  tablet Commonly known as:  PHENERGAN TAKE 1 TABLET (25 MG TOTAL) BY MOUTH EVERY 8 (EIGHT) HOURS AS NEEDED FOR NAUSEA. What changed:    how much to take  how to take this  when to take this  reasons to take this  additional instructions   SUMAtriptan 100 MG tablet Commonly known as:  IMITREX TAKE 1 TABLET (100 MG TOTAL) BY MOUTH EVERY 2 (TWO) HOURS AS NEEDED. What changed:    how much to take  how to take this  when to take this  reasons to take this  additional instructions       Disposition:  Home  Discharge Instructions    Diet - low sodium heart healthy   Complete by:  As directed    Increase activity slowly   Complete by:  As directed        Follow-up Information    [facility] Follow up on 03/11/2018.   Specialty:  Cardiology Why:  9:30AM Contact information: [address] [id number] [place] [phone]       Thompson Grayer, MD Follow up on 05/17/2018.   Specialty:  Cardiology Why:  9:15AM Contact information: [place] [address] [phone]           Duration of Discharge Encounter: Greater than 30 minutes including physician time.  Venetia Night, PA-C 02/12/2018 7:44 AM  I have seen and examined this patient with Tommye Standard.  Agree with above, note added to reflect my findings.  On exam, RRR, no murmurs, lungs clear.  Patient had AF ablation for atrial fibrillation. Tolerated the procedure well without complaint this morning. Plan for discharge today with follow up in EP clinic.  Jack Bolio M. Helios Kohlmann MD 02/12/2018 10:59 AM

## 2018-02-11 NOTE — Anesthesia Preprocedure Evaluation (Addendum)
Anesthesia Evaluation  Patient identified by MRN, date of birth, ID band Patient awake    Reviewed: Allergy & Precautions, NPO status , Patient's Chart, lab work & pertinent test results, reviewed documented beta blocker date and time   History of Anesthesia Complications Negative for: history of anesthetic complications  Airway Mallampati: IV  TM Distance: >3 FB Neck ROM: Full  Mouth opening: Limited Mouth Opening  Dental  (+) Dental Advisory Given   Pulmonary neg pulmonary ROS,    breath sounds clear to auscultation- rhonchi       Cardiovascular hypertension, Pt. on medications and Pt. on home beta blockers + dysrhythmias Atrial Fibrillation  Rhythm:Regular Rate:Normal  '18 ECHO:  EF 70%, mildly enlarged LA, mild MR, mild to moderate TR   Neuro/Psych  Headaches,    GI/Hepatic negative GI ROS, Neg liver ROS,   Endo/Other  negative endocrine ROS  Renal/GU negative Renal ROS     Musculoskeletal   Abdominal   Peds  Hematology negative hematology ROS (+)   Anesthesia Other Findings   Reproductive/Obstetrics                            Anesthesia Physical Anesthesia Plan  ASA: III  Anesthesia Plan: General   Post-op Pain Management:    Induction: Intravenous  PONV Risk Score and Plan: 3 and Ondansetron, Dexamethasone and Treatment may vary due to age or medical condition  Airway Management Planned: Oral ETT and Video Laryngoscope Planned  Additional Equipment:   Intra-op Plan:   Post-operative Plan: Extubation in OR  Informed Consent: I have reviewed the patients History and Physical, chart, labs and discussed the procedure including the risks, benefits and alternatives for the proposed anesthesia with the patient or authorized representative who has indicated his/her understanding and acceptance.   Dental advisory given  Plan Discussed with: CRNA and Surgeon  Anesthesia Plan  Comments: (Plan routine monitors, GETA with VideoGlide intubation)        Anesthesia Quick Evaluation

## 2018-02-11 NOTE — Progress Notes (Addendum)
Site area: RFV x 3 Site Prior to Removal:  Level 0 Pressure Applied For:25 min Manual:   yes Patient Status During Pull:  stable Post Pull Site:  Level Post Pull Instructions Given:  yes Post Pull Pulses Present: palpable Dressing Applied:  tegaderm Bedrest begins @ 1700 till 2300 Comments:

## 2018-02-12 ENCOUNTER — Encounter (HOSPITAL_COMMUNITY): Payer: Self-pay | Admitting: Internal Medicine

## 2018-02-12 DIAGNOSIS — I481 Persistent atrial fibrillation: Secondary | ICD-10-CM | POA: Diagnosis not present

## 2018-02-12 DIAGNOSIS — I10 Essential (primary) hypertension: Secondary | ICD-10-CM | POA: Diagnosis not present

## 2018-02-12 DIAGNOSIS — Z7901 Long term (current) use of anticoagulants: Secondary | ICD-10-CM | POA: Diagnosis not present

## 2018-02-12 DIAGNOSIS — Z885 Allergy status to narcotic agent status: Secondary | ICD-10-CM | POA: Diagnosis not present

## 2018-02-12 DIAGNOSIS — I484 Atypical atrial flutter: Secondary | ICD-10-CM | POA: Diagnosis not present

## 2018-02-12 MED ORDER — PANTOPRAZOLE SODIUM 40 MG PO TBEC: 40.0000 mg | DELAYED_RELEASE_TABLET | Freq: Every day | ORAL | Status: DC

## 2018-02-16 ENCOUNTER — Encounter: Payer: Self-pay | Admitting: Emergency Medicine

## 2018-02-16 ENCOUNTER — Other Ambulatory Visit: Payer: Self-pay

## 2018-02-16 ENCOUNTER — Ambulatory Visit (INDEPENDENT_AMBULATORY_CARE_PROVIDER_SITE_OTHER): Payer: Medicare Other | Admitting: Emergency Medicine

## 2018-02-16 VITALS — BP 118/68 | HR 72 | Temp 98.2°F | Resp 16 | Ht 62.0 in | Wt 147.2 lb

## 2018-02-16 DIAGNOSIS — G43009 Migraine without aura, not intractable, without status migrainosus: Secondary | ICD-10-CM

## 2018-02-16 DIAGNOSIS — Z8679 Personal history of other diseases of the circulatory system: Secondary | ICD-10-CM

## 2018-02-16 DIAGNOSIS — Z7901 Long term (current) use of anticoagulants: Secondary | ICD-10-CM | POA: Diagnosis not present

## 2018-02-16 MED ORDER — HYDROCODONE-ACETAMINOPHEN 5-325 MG PO TABS: 1.0000 | ORAL_TABLET | Freq: Four times a day (QID) | ORAL | 0 refills | Status: DC | PRN
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
## 2018-02-16 NOTE — Patient Instructions (Addendum)
     IF you received an x-ray today, you will receive an invoice from [facility]. Please contact [facility] at [phone] with questions or concerns regarding your invoice.   IF you received labwork today, you will receive an invoice from [facility]. Please contact [facility] at [phone] with questions or concerns regarding your invoice.   Our billing staff will not be able to assist you with questions regarding bills from these companies.  You will be contacted with the lab results as soon as they are available. The fastest way to get your results is to activate your My Chart account. Instructions are located on the last page of this paperwork. If you have not heard from [place] regarding the results in 2 weeks, please contact this office.     Migraine Headache A migraine headache is a very strong throbbing pain on one side or both sides of your head. Migraines can also cause other symptoms. Talk with your doctor about what things may bring on (trigger) your migraine headaches. Follow these instructions at home: Medicines  Take over-the-counter and prescription medicines only as told by your doctor.  Do not drive or use heavy machinery while taking prescription pain medicine.  To prevent or treat constipation while you are taking prescription pain medicine, your doctor may recommend that you: ? Drink enough fluid to keep your pee (urine) clear or pale yellow. ? Take over-the-counter or prescription medicines. ? Eat foods that are high in fiber. These include fresh fruits and vegetables, whole grains, and beans. ? Limit foods that are high in fat and processed sugars. These include fried and sweet foods. Lifestyle  Avoid alcohol.  Do not use any products that contain nicotine or tobacco, such as cigarettes and e-cigarettes. If you need help quitting, ask your doctor.  Get at least 8 hours of sleep every night.  Limit your stress. General instructions   Keep a  journal to find out what may bring on your migraines. For example, write down: ? What you eat and drink. ? How much sleep you get. ? Any change in what you eat or drink. ? Any change in your medicines.  If you have a migraine: ? Avoid things that make your symptoms worse, such as bright lights. ? It may help to lie down in a dark, quiet room. ? Do not drive or use heavy machinery. ? Ask your doctor what activities are safe for you.  Keep all follow-up visits as told by your doctor. This is important. Contact a doctor if:  You get a migraine that is different or worse than your usual migraines. Get help right away if:  Your migraine gets very bad.  You have a fever.  You have a stiff neck.  You have trouble seeing.  Your muscles feel weak or like you cannot control them.  You start to lose your balance a lot.  You start to have trouble walking.  You pass out (faint). This information is not intended to replace advice given to you by your health care provider. Make sure you discuss any questions you have with your health care provider. Document Released: 08/19/2008 Document Revised: 05/30/2016 Document Reviewed: 04/28/2016 Elsevier Interactive Patient Education  2018 [employer].

## 2018-02-17 ENCOUNTER — Telehealth: Payer: Self-pay | Admitting: Nurse Practitioner

## 2018-02-17 NOTE — Telephone Encounter (Signed)
Pt's had a migraine intermittently for the past 2 weeks. She has taken the monthly quota of imitrex and has taken gabapentin 300mg  3 x day but it is not helping. An appt was schedule for 3/28 with Hoyle Sauer. Please call if she does not need to be seen tomorrow. Thank you

## 2018-02-17 NOTE — Telephone Encounter (Signed)
Noted, will see patient.

## 2018-02-18 ENCOUNTER — Ambulatory Visit (INDEPENDENT_AMBULATORY_CARE_PROVIDER_SITE_OTHER): Payer: Medicare Other | Admitting: Nurse Practitioner

## 2018-02-18 ENCOUNTER — Encounter: Payer: Self-pay | Admitting: Nurse Practitioner

## 2018-02-18 VITALS — BP 129/69 | HR 61 | Wt 147.6 lb

## 2018-02-18 DIAGNOSIS — G43009 Migraine without aura, not intractable, without status migrainosus: Secondary | ICD-10-CM | POA: Diagnosis not present

## 2018-02-18 MED ORDER — TOPIRAMATE 25 MG PO TABS: ORAL_TABLET | ORAL | 3 refills | Status: DC

## 2018-02-18 MED ORDER — SUMATRIPTAN SUCCINATE 100 MG PO TABS: 100.0000 mg | ORAL_TABLET | ORAL | 3 refills | Status: DC | PRN

## 2018-02-18 NOTE — Progress Notes (Signed)
I have read the note, and I agree with the clinical assessment and plan.  Richard A. Sater, MD, PhD, FAAN Certified in Neurology, Clinical Neurophysiology, Sleep Medicine, Pain Medicine and Neuroimaging  [facility] [address], [address] [place], [place] [address] [phone]  
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
## 2018-02-18 NOTE — Patient Instructions (Signed)
Begin topiramate 25 mg at night for 1 week then increase to 2 tabs at night.  Stay at this dose until follow-up. When you are on 50 mg of topiramate, may decrease gabapentin by 1 pill each week until discontinued  Continue Imitrex at current dose will refill  Continue Phenergan  Prn Keep a record of your headaches migraine tracker migraine buddy APP Follow-up in 2 months Try to avoid migraine triggers

## 2018-03-08 NOTE — Telephone Encounter (Signed)
Pt calling to inform that since the 28th of March she has been on the topiramate (TOPAMAX) 25 MG tablet and she is still having head aches.  Pt is asking for a call back.

## 2018-03-09 MED ORDER — TOPIRAMATE 25 MG PO TABS: ORAL_TABLET | ORAL | 1 refills | Status: DC

## 2018-03-09 NOTE — Telephone Encounter (Signed)
She can increase to 75 mg of Topamax for 1 week and then increase to 100 mg.  This will be called into your pharmacy.  Please call the patient

## 2018-03-09 NOTE — Telephone Encounter (Signed)
I spoke to pt and relayed the increase to her of the topamax as per below.  She verbalized understanding.  She is to take only 10 tabs of imitrex in  30 days.  This is a rescue drug and only to be used prn.  She verbalized understanding. She will call back as needed.

## 2018-03-09 NOTE — Addendum Note (Signed)
Addended by: Oliver Hum S on: 03/09/2018 01:30 PM   Modules accepted: Orders

## 2018-03-09 NOTE — Telephone Encounter (Signed)
Spoke to pt and she relayed that she continues with daily back of the head headaches.  No change due to increase of topamax 50mg  po qhs.  She is down to one gabapentin daily.  She has reached her maximum on imitrex this month.  Imitrex helps but then comes back the next day.  Please advise.

## 2018-03-11 ENCOUNTER — Ambulatory Visit (HOSPITAL_COMMUNITY)
Admission: RE | Admit: 2018-03-11 | Discharge: 2018-03-11 | Disposition: A | Discharge status: Home / Self Care | Payer: Medicare Other | Source: Ambulatory Visit | Attending: Nurse Practitioner | Admitting: Nurse Practitioner

## 2018-03-11 ENCOUNTER — Encounter (HOSPITAL_COMMUNITY): Payer: Self-pay | Admitting: Nurse Practitioner

## 2018-03-11 VITALS — BP 138/84 | HR 53 | Ht 62.0 in | Wt 143.2 lb

## 2018-03-11 DIAGNOSIS — Z833 Family history of diabetes mellitus: Secondary | ICD-10-CM | POA: Diagnosis not present

## 2018-03-11 DIAGNOSIS — Z96641 Presence of right artificial hip joint: Secondary | ICD-10-CM | POA: Diagnosis not present

## 2018-03-11 DIAGNOSIS — Z8249 Family history of ischemic heart disease and other diseases of the circulatory system: Secondary | ICD-10-CM | POA: Diagnosis not present

## 2018-03-11 DIAGNOSIS — Z85828 Personal history of other malignant neoplasm of skin: Secondary | ICD-10-CM | POA: Diagnosis not present

## 2018-03-11 DIAGNOSIS — Z7901 Long term (current) use of anticoagulants: Secondary | ICD-10-CM | POA: Insufficient documentation

## 2018-03-11 DIAGNOSIS — Z888 Allergy status to other drugs, medicaments and biological substances status: Secondary | ICD-10-CM | POA: Insufficient documentation

## 2018-03-11 DIAGNOSIS — Z79899 Other long term (current) drug therapy: Secondary | ICD-10-CM | POA: Diagnosis not present

## 2018-03-11 DIAGNOSIS — I481 Persistent atrial fibrillation: Secondary | ICD-10-CM | POA: Insufficient documentation

## 2018-03-11 DIAGNOSIS — Z885 Allergy status to narcotic agent status: Secondary | ICD-10-CM | POA: Insufficient documentation

## 2018-03-11 DIAGNOSIS — I4819 Other persistent atrial fibrillation: Secondary | ICD-10-CM
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
## 2018-03-24 ENCOUNTER — Telehealth: Payer: Self-pay | Admitting: Nurse Practitioner

## 2018-03-24 ENCOUNTER — Encounter: Payer: Self-pay | Admitting: Nurse Practitioner

## 2018-03-24 ENCOUNTER — Ambulatory Visit (INDEPENDENT_AMBULATORY_CARE_PROVIDER_SITE_OTHER): Payer: Medicare Other | Admitting: Nurse Practitioner

## 2018-03-24 VITALS — BP 120/60 | HR 58 | Ht 62.0 in | Wt 141.0 lb

## 2018-03-24 DIAGNOSIS — G43009 Migraine without aura, not intractable, without status migrainosus: Secondary | ICD-10-CM

## 2018-03-24 MED ORDER — FREMANEZUMAB-VFRM 225 MG/1.5ML ~~LOC~~ SOSY: 1.0000 | PREFILLED_SYRINGE | SUBCUTANEOUS | 0 refills | Status: DC

## 2018-03-24 NOTE — Telephone Encounter (Signed)
Pt called stating she has been taking topiramate (TOPAMAX) 25 MG tablet for about a month and has been suffering from s/e such as blurred vision, tingling in her hands and face, along with an upset stomach. Pt states she hasn't had any relief with her headaches and would like a call back to discuss.

## 2018-03-24 NOTE — Patient Instructions (Signed)
Stop topiramate due to side effects  Try Ajovy 1 injection every 30 days Continue Imitrex at current dose will refill  Continue Phenergan  Prn Follow-up in  months Try to avoid migraine triggers Limit OTC due to rebound F/U in 2 months
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
## 2018-03-24 NOTE — Telephone Encounter (Signed)
Spoke to pt and she has no improvement with topamax 100mg  po qhs.  She has constant level 5 headache in back of her head. She has intermittent  Sx of blurry vision, tingling/numbness, upset stomache/dirrahea.  I made appt for her today to evaluate at 1345.  She will arrive 1315.  Last seen end of March.

## 2018-03-24 NOTE — Telephone Encounter (Signed)
Noted  

## 2018-03-30 ENCOUNTER — Telehealth: Payer: Self-pay | Admitting: Nurse Practitioner

## 2018-03-30 NOTE — Telephone Encounter (Signed)
Patient calling and states she needs PA for Fremanezumab-vfrm (AJOVY) 225 MG/1.5ML SOSY.

## 2018-03-31 MED ORDER — ERENUMAB-AOOE 70 MG/ML ~~LOC~~ SOAJ
70.0000 mg | SUBCUTANEOUS | 3 refills | Status: DC
Start: 2018-03-31 — End: 2018-06-30

## 2018-03-31 NOTE — Telephone Encounter (Signed)
Changed to aimovig and rx sent

## 2018-03-31 NOTE — Addendum Note (Signed)
Addended by: Otilio Jefferson on: 03/31/2018 04:08 PM   Modules accepted: Orders

## 2018-03-31 NOTE — Telephone Encounter (Signed)
Spoke with patient and advised that this RN can either send in an appeal for Ajovy which can take up to 7 days for a reply, or she can try [place] which is on her drug formulary.  This RN did inform her that Aimovig may require PA, but it should go through quickly since it is on her formulary.  The patient would like to try Aimovig . This RN stated will route to Hoyle Sauer to have Ajovy Rx discontinued and Aimovig prescribed. Patient verbalized understanding, appreciation.

## 2018-03-31 NOTE — Telephone Encounter (Addendum)
Received notification, Ajovy denied.  Called patient and LVM requesting she call back to discuss options.

## 2018-03-31 NOTE — Telephone Encounter (Addendum)
Initiated Arie Sabina PA on CMM. [facility] is reviewing PA request. Typically an electronic response will be received within 72 hours. Spoke with patient and informed her of above. She asked why PA wasn't done last week. This RN advised her that a message stating PA was needed typically comes from the pharmacy, however [facility] never contacted this office stating Ajovy PA needed. She stated [facility] told her Rx would be ready last Fri, but it wasn't. She had taken [facility] all the information including discount card. She asked why they told her that it would be ready when it wasn't. This RN advised she needs to ask pharmacist that gave her that information; this RN would not know the reason. Patient asked what to do if she doesn't hear back in 72 hours. This RN advised she call [facility] on Sat and ask them to run drug through insurance because the 72 hours will be up on Sat, and this office is closed on weekends.  She verbalized understanding, appreciation.

## 2018-03-31 NOTE — Telephone Encounter (Signed)
Pt has returned the call to RN Mary C, please call °

## 2018-04-01 NOTE — Telephone Encounter (Addendum)
Received PA request for Aimovig. Initiated PA on CMM, [facility] is reviewing your PA request. Typically an electronic response will be received within 72 hours. Received reply: Aimovig, use as directed approved through 07/02/18 under Medicare Part D benefit. PA- [id number] Pt ID # [id number] Called patient and gave her above information, including PA #, [facility] phone # in the event her pharmacy needed it. She inquired about possible side effects, advised her some patients report constipation which is typically easily managed with diet, hydration and OTC medication if needed.  Advised her she may not realize full benefits until after > 1 injection. She verbalized understanding, appreciation.

## 2018-04-01 NOTE — Telephone Encounter (Signed)
Pt requesting to know if AIMOVIG has copay cards? Please call to advise at [phone]

## 2018-04-01 NOTE — Telephone Encounter (Signed)
Spoke with patient and informed her that because she has Medicare she does not qualify for the co pay assistance card. Advised her there is an application for patient assistance she can send in.  This RN read the section labeled "Are you eligible?" She stated that she would not qualify. She stated the pharmacy told her that her cost is > $400 per injection. This RN asked her what she thought she may do. She stated she would probably try the medication anyway. This RN advised she call if she changes her mind or has other questions, concerns. She verbalized understanding, appreciation.

## 2018-04-05 NOTE — Telephone Encounter (Signed)
Received call from [facility] appeals dept re: Ajovy. She stated she had further questions regarding the appeal. This RN advised her that when insurance denied Ajovy and wanted patient to try Aimovig first, the NP ordered Aimovig. This RN advised her to cancel the Ajovy appeal. Angelica verbalized understanding.

## 2018-04-07 DIAGNOSIS — H1031 Unspecified acute conjunctivitis, right eye: Secondary | ICD-10-CM | POA: Diagnosis not present

## 2018-04-08 ENCOUNTER — Telehealth: Payer: Self-pay | Admitting: Nurse Practitioner

## 2018-04-08 NOTE — Telephone Encounter (Signed)
Spoke with patient and informed her that NP reviewed entire manufactures information regarding side effects. No cardiac side effects are listed. Advised patient Christina [name] recommends she call her cardiologist. Patient verbalized understanding, appreciation.

## 2018-04-08 NOTE — Telephone Encounter (Signed)
Aimovig side effect or pain redness or swelling at the injection site and patient.  No cardiac side effect listed.  I suggest she follow-up with your cardiologist

## 2018-04-08 NOTE — Telephone Encounter (Signed)
Spoke with patient who stated she injected Aimovig (not Ajovy as previous note stated) at 5:30 pm last night. She stated at 11:30 pm and 1 am she experienced extreme warmness over her entire body and increased heart rate. This RN reviewed typical side effects of rash or swelling and advised her if she had swelling of face, mouth, tongue or throat those are emergency reactions that require immediate attention. She denied all those reactions. She has history of A fib and ablations. She stated the increased heart rate did not feel like her a fib, but she is concerned about going back into A fib. This RN advised will send to NP and call her later today with reply. She verbalized understanding, appreciation.

## 2018-04-08 NOTE — Telephone Encounter (Signed)
Pt called stating that Monday 5/13 she took her first dose of Ajovy. Pt states that twice throughout the night she felt exteremly warm and her HR elevated. Pt requesting a call to discuss.

## 2018-04-22 DIAGNOSIS — H04123 Dry eye syndrome of bilateral lacrimal glands: Secondary | ICD-10-CM | POA: Diagnosis not present

## 2018-04-28 ENCOUNTER — Ambulatory Visit: Payer: Medicare Other | Admitting: Nurse Practitioner

## 2018-05-17 ENCOUNTER — Other Ambulatory Visit: Payer: Self-pay

## 2018-05-17 ENCOUNTER — Encounter: Payer: Self-pay | Admitting: Internal Medicine

## 2018-05-17 ENCOUNTER — Ambulatory Visit (INDEPENDENT_AMBULATORY_CARE_PROVIDER_SITE_OTHER): Payer: Medicare Other | Admitting: Internal Medicine

## 2018-05-17 VITALS — BP 126/80 | Ht 63.0 in | Wt 142.4 lb

## 2018-05-17 DIAGNOSIS — I4892 Unspecified atrial flutter: Secondary | ICD-10-CM

## 2018-05-17 DIAGNOSIS — I481 Persistent atrial fibrillation: Secondary | ICD-10-CM | POA: Diagnosis not present

## 2018-05-17 DIAGNOSIS — R739 Hyperglycemia, unspecified: Secondary | ICD-10-CM | POA: Insufficient documentation

## 2018-05-17 DIAGNOSIS — I4819 Other persistent atrial fibrillation: Secondary | ICD-10-CM

## 2018-05-17 DIAGNOSIS — N952 Postmenopausal atrophic vaginitis: Secondary | ICD-10-CM | POA: Insufficient documentation

## 2018-05-17 DIAGNOSIS — R195 Other fecal abnormalities: Secondary | ICD-10-CM | POA: Insufficient documentation

## 2018-05-17 MED ORDER — ATENOLOL 25 MG PO TABS: 25.0000 mg | ORAL_TABLET | Freq: Two times a day (BID) | ORAL | 1 refills | Status: DC

## 2018-05-17 MED ORDER — FLECAINIDE ACETATE 50 MG PO TABS: 50.0000 mg | ORAL_TABLET | Freq: Two times a day (BID) | ORAL | 1 refills | Status: DC
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
## 2018-05-17 NOTE — Patient Instructions (Signed)
Medication Instructions:  Your physician has recommended you make the following change in your medication:  1. DECREASE Flecainide to 50 mg twice daily 2. DECREASE Atenolol to 25 mg once daily  Labwork: None ordered  Testing/Procedures: None ordered  Follow-Up: Your physician recommends that you schedule a follow-up appointment in: 3 months with Dr. Rayann Heman.   * If you need a refill on your cardiac medications before your next appointment, please call your pharmacy.   *Please note that any paperwork needing to be filled out by the provider will need to be addressed at the front desk prior to seeing the provider. Please note that any FMLA, disability or other documents regarding health condition is subject to a $25.00 charge that must be received prior to completion of paperwork in the form of a money order or check.  Thank you for choosing [facility]!!

## 2018-05-31 ENCOUNTER — Encounter: Payer: Self-pay | Admitting: Internal Medicine

## 2018-06-01 ENCOUNTER — Encounter (INDEPENDENT_AMBULATORY_CARE_PROVIDER_SITE_OTHER): Payer: Self-pay

## 2018-06-03 ENCOUNTER — Ambulatory Visit (HOSPITAL_COMMUNITY)
Admission: RE | Admit: 2018-06-03 | Discharge: 2018-06-03 | Disposition: A | Discharge status: Home / Self Care | Payer: Medicare Other | Source: Ambulatory Visit | Attending: Nurse Practitioner | Admitting: Nurse Practitioner

## 2018-06-03 ENCOUNTER — Encounter (HOSPITAL_COMMUNITY): Payer: Self-pay | Admitting: Nurse Practitioner

## 2018-06-03 VITALS — BP 124/76 | HR 52 | Ht 63.0 in | Wt 144.0 lb

## 2018-06-03 DIAGNOSIS — Z96641 Presence of right artificial hip joint: Secondary | ICD-10-CM | POA: Insufficient documentation

## 2018-06-03 DIAGNOSIS — Z9889 Other specified postprocedural states: Secondary | ICD-10-CM | POA: Diagnosis not present

## 2018-06-03 DIAGNOSIS — Z85828 Personal history of other malignant neoplasm of skin: Secondary | ICD-10-CM | POA: Insufficient documentation

## 2018-06-03 DIAGNOSIS — Z79899 Other long term (current) drug therapy: Secondary | ICD-10-CM | POA: Diagnosis not present

## 2018-06-03 DIAGNOSIS — I480 Paroxysmal atrial fibrillation: Secondary | ICD-10-CM | POA: Diagnosis not present

## 2018-06-03 DIAGNOSIS — Z7901 Long term (current) use of anticoagulants: Secondary | ICD-10-CM | POA: Insufficient documentation

## 2018-06-03 DIAGNOSIS — I481 Persistent atrial fibrillation: Secondary | ICD-10-CM | POA: Insufficient documentation

## 2018-06-03 DIAGNOSIS — Z885 Allergy status to narcotic agent status: Secondary | ICD-10-CM | POA: Diagnosis not present

## 2018-06-03 DIAGNOSIS — Z8249 Family history of ischemic heart disease and other diseases of the circulatory system: Secondary | ICD-10-CM | POA: Insufficient documentation

## 2018-06-03 DIAGNOSIS — E119 Type 2 diabetes mellitus without complications: Secondary | ICD-10-CM | POA: Insufficient documentation

## 2018-06-03 MED ORDER — FLECAINIDE ACETATE 50 MG PO TABS: 100.0000 mg | ORAL_TABLET | Freq: Two times a day (BID) | ORAL | 1 refills | Status: DC

## 2018-06-03 NOTE — Patient Instructions (Signed)
Atenolol 1/2 tablet twice a day Increase flecainide to 100mg  twice a day
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
## 2018-06-10 DIAGNOSIS — R1032 Left lower quadrant pain: Secondary | ICD-10-CM | POA: Diagnosis not present

## 2018-06-14 ENCOUNTER — Encounter (HOSPITAL_COMMUNITY): Payer: Self-pay | Admitting: Nurse Practitioner

## 2018-06-14 ENCOUNTER — Ambulatory Visit (HOSPITAL_COMMUNITY)
Admission: RE | Admit: 2018-06-14 | Discharge: 2018-06-14 | Disposition: A | Discharge status: Home / Self Care | Payer: Medicare Other | Source: Ambulatory Visit | Attending: Nurse Practitioner | Admitting: Nurse Practitioner

## 2018-06-14 DIAGNOSIS — Z79899 Other long term (current) drug therapy: Secondary | ICD-10-CM | POA: Diagnosis not present

## 2018-06-14 DIAGNOSIS — R001 Bradycardia, unspecified: Secondary | ICD-10-CM | POA: Insufficient documentation

## 2018-06-14 DIAGNOSIS — I4891 Unspecified atrial fibrillation: Secondary | ICD-10-CM | POA: Diagnosis not present

## 2018-06-14 MED ORDER — FLECAINIDE ACETATE 100 MG PO TABS: 100.0000 mg | ORAL_TABLET | Freq: Two times a day (BID) | ORAL | 2 refills | Status: DC

## 2018-06-14 MED ORDER — ATENOLOL 25 MG PO TABS: 25.0000 mg | ORAL_TABLET | Freq: Two times a day (BID) | ORAL | 2 refills | Status: DC

## 2018-06-14 NOTE — Progress Notes (Signed)
Pt in to report afib burden back on flecainide 100 mg bid and back on half dose of atenolol at 25 mg bid, she had tried to wean off BB but afib burden increased.  This has controlled her afib. She had breakthrough afib on 50 mg flecainide bid. EKG shows S brady at 50 bpm. Continue doses of flecainide and BB. She feels well on this dose. F/u with Dr. Rayann Heman as planned  9/30.

## 2018-06-15 DIAGNOSIS — I491 Atrial premature depolarization: Secondary | ICD-10-CM | POA: Diagnosis not present

## 2018-06-15 DIAGNOSIS — I493 Ventricular premature depolarization: Secondary | ICD-10-CM | POA: Diagnosis not present

## 2018-06-15 DIAGNOSIS — Z7901 Long term (current) use of anticoagulants: Secondary | ICD-10-CM | POA: Diagnosis not present

## 2018-06-15 DIAGNOSIS — I480 Paroxysmal atrial fibrillation: Secondary | ICD-10-CM | POA: Diagnosis not present
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
## 2018-06-30 ENCOUNTER — Ambulatory Visit (INDEPENDENT_AMBULATORY_CARE_PROVIDER_SITE_OTHER): Payer: Medicare Other | Admitting: Nurse Practitioner

## 2018-06-30 ENCOUNTER — Encounter: Payer: Self-pay | Admitting: Nurse Practitioner

## 2018-06-30 VITALS — BP 106/58 | HR 62 | Ht 63.0 in | Wt 145.0 lb

## 2018-06-30 DIAGNOSIS — G43009 Migraine without aura, not intractable, without status migrainosus: Secondary | ICD-10-CM | POA: Diagnosis not present

## 2018-06-30 MED ORDER — ERENUMAB-AOOE 70 MG/ML ~~LOC~~ SOAJ: 70.0000 mg | SUBCUTANEOUS | 11 refills | Status: DC

## 2018-06-30 MED ORDER — PROMETHAZINE HCL 25 MG PO TABS: ORAL_TABLET | ORAL | 1 refills | Status: DC

## 2018-06-30 MED ORDER — SUMATRIPTAN SUCCINATE 100 MG PO TABS: 100.0000 mg | ORAL_TABLET | ORAL | 6 refills | Status: DC | PRN

## 2018-06-30 NOTE — Patient Instructions (Signed)
Continue Aimovig 70mg   injection every 30 days Continue Imitrex at current dose will refill  Continue Phenergan  Prn Follow-up yearly

## 2018-07-01 NOTE — Progress Notes (Signed)
I reviewed note and agree with plan.   Penni Bombard, MD 06/26/2548, 82:64 AM Certified in Neurology, Neurophysiology and Neuroimaging  [facility] [address], [place], [place] [address] [phone]

## 2018-07-21 DIAGNOSIS — H52223 Regular astigmatism, bilateral: Secondary | ICD-10-CM | POA: Diagnosis not present

## 2018-07-21 DIAGNOSIS — H25813 Combined forms of age-related cataract, bilateral: Secondary | ICD-10-CM | POA: Diagnosis not present

## 2018-07-21 DIAGNOSIS — H5203 Hypermetropia, bilateral: Secondary | ICD-10-CM | POA: Diagnosis not present

## 2018-07-21 DIAGNOSIS — H353121 Nonexudative age-related macular degeneration, left eye, early dry stage: Secondary | ICD-10-CM | POA: Diagnosis not present

## 2018-07-21 DIAGNOSIS — H353111 Nonexudative age-related macular degeneration, right eye, early dry stage: Secondary | ICD-10-CM | POA: Diagnosis not present

## 2018-07-22 ENCOUNTER — Other Ambulatory Visit: Payer: Self-pay | Admitting: Nurse Practitioner

## 2018-07-22 ENCOUNTER — Telehealth: Payer: Self-pay | Admitting: Nurse Practitioner

## 2018-07-22 ENCOUNTER — Telehealth: Payer: Self-pay

## 2018-07-22 MED ORDER — FREMANEZUMAB-VFRM 225 MG/1.5ML ~~LOC~~ SOSY: 225.0000 mg | PREFILLED_SYRINGE | SUBCUTANEOUS | 3 refills | Status: DC

## 2018-07-22 NOTE — Telephone Encounter (Addendum)
Spoke with Cecille Rubin, NP verbally and she was agreeable to sending the Rx for Ajovy for Ms. Widmer. [facility] [address]  I contacted the patient back and advised we [facility], but there is a possibility a PA may be required for Ajovy. Patient verbalized understanding on this and had no further questions.  Will monitor for a PA from the pharmacy.  MB RN.

## 2018-07-22 NOTE — Telephone Encounter (Signed)
I contacted the patient to further discuss this message. Patient stated her last Amiovig injection was 07/02/2018 and over the last 10 days she has headaches, 3 of which have converted to migraines. Patient denied light sensitivity or blurred vision. She states her primiary symptoms have been nausea and headache pain. Patient is requesting her Aimovig be switched to Ajovy. MB RN

## 2018-07-22 NOTE — Addendum Note (Signed)
Addended by: Verlin Grills T on: 07/22/2018 01:28 PM   Modules accepted: Orders

## 2018-07-22 NOTE — Telephone Encounter (Signed)
Yes if insurance will cover

## 2018-07-22 NOTE — Telephone Encounter (Signed)
Pt called stating for the past 10 days her headaches have came through everyday, while taking Erenumab-aooe (AIMOVIG) 29 MG/ML SOAJ. Would like to discuss if she can switch to [employer]

## 2018-07-22 NOTE — Telephone Encounter (Signed)
Ajovy PA submitted to Cover My Meds. PA Key- [id number] Determination should be reached within 72 hrs. Patient notified PA has been initiated.  MB RN.

## 2018-07-23 NOTE — Telephone Encounter (Signed)
Pt has returned the call to [name], she is asking for a call back

## 2018-07-23 NOTE — Telephone Encounter (Signed)
Patient notified of PA approval. She voiced understanding and had no further questions at this time.

## 2018-07-23 NOTE — Telephone Encounter (Addendum)
PA approval received from cover my meds.  Approval number- [id number] Christina [name] is approved from 07/23/2018 through 11/23/2018.  [facility] [address] [facility].  MB RN

## 2018-07-28 ENCOUNTER — Ambulatory Visit: Payer: Medicare Other | Admitting: Nurse Practitioner

## 2018-08-17 ENCOUNTER — Telehealth: Payer: Self-pay

## 2018-08-17 NOTE — Telephone Encounter (Signed)
Received a fax from [facility] stating that the Rx was incomplete due to lack information.  A new script for Ajovy has been sent to [facility] Quant[facility] 3 with 3 refills has been faxed by [facility].

## 2018-08-23 ENCOUNTER — Encounter: Payer: Self-pay | Admitting: Internal Medicine

## 2018-08-23 ENCOUNTER — Ambulatory Visit (INDEPENDENT_AMBULATORY_CARE_PROVIDER_SITE_OTHER): Payer: Medicare Other | Admitting: Internal Medicine

## 2018-08-23 VITALS — BP 116/74 | HR 53 | Ht 63.0 in | Wt 141.8 lb

## 2018-08-23 DIAGNOSIS — I4819 Other persistent atrial fibrillation: Secondary | ICD-10-CM

## 2018-08-23 DIAGNOSIS — I481 Persistent atrial fibrillation: Secondary | ICD-10-CM | POA: Diagnosis not present

## 2018-08-23 DIAGNOSIS — I4892 Unspecified atrial flutter: Secondary | ICD-10-CM | POA: Diagnosis not present

## 2018-08-23 MED ORDER — ATENOLOL 25 MG PO TABS: 25.0000 mg | ORAL_TABLET | Freq: Every day | ORAL | 3 refills | Status: DC
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
## 2018-08-23 NOTE — Patient Instructions (Addendum)
Medication Instructions:  Your physician has recommended you make the following change in your medication:   1.  Stop taking flecainide today  2.  In 3 weeks (September 13, 2018) decrease your atenolol 25 mg--Take ONE tablet by mouth daily.   Labwork: None ordered.  Testing/Procedures: None ordered.  Follow-Up: Your physician wants you to follow-up in: 6 months with Dr. Rayann Heman.     Any Other Special Instructions Will Be Listed Below (If Applicable).  If you need a refill on your cardiac medications before your next appointment, please call your pharmacy.

## 2018-08-25 DIAGNOSIS — Z23 Encounter for immunization: Secondary | ICD-10-CM | POA: Diagnosis not present

## 2018-08-31 ENCOUNTER — Telehealth (HOSPITAL_COMMUNITY): Payer: Self-pay | Admitting: *Deleted

## 2018-08-31 ENCOUNTER — Other Ambulatory Visit (HOSPITAL_COMMUNITY): Payer: Self-pay | Admitting: *Deleted

## 2018-08-31 DIAGNOSIS — I480 Paroxysmal atrial fibrillation: Secondary | ICD-10-CM

## 2018-08-31 NOTE — Telephone Encounter (Signed)
Patient called in stating since stopping her flecainide as instructed by Dr. Rayann Heman last visit she was having frequent episodes of fast heart rates several times a day so she resumed it at 50mg  at first then back to her normal dosing of 100mg  BID. She continues to have the feelings of fast heart rates but her Jodelle Red is showing her HR in the 60s. Discussed with Roderic Palau NP - will place a 48 hour monitor to assess rhythm to determine if flecainide still needed. Pt in agreement.

## 2018-09-01 ENCOUNTER — Ambulatory Visit (HOSPITAL_COMMUNITY)
Admission: RE | Admit: 2018-09-01 | Discharge: 2018-09-01 | Disposition: A | Discharge status: Home / Self Care | Payer: Medicare Other | Source: Ambulatory Visit | Attending: Nurse Practitioner | Admitting: Nurse Practitioner

## 2018-09-01 DIAGNOSIS — I480 Paroxysmal atrial fibrillation: Secondary | ICD-10-CM

## 2018-09-09 DIAGNOSIS — I480 Paroxysmal atrial fibrillation: Secondary | ICD-10-CM | POA: Diagnosis not present

## 2018-09-10 DIAGNOSIS — L821 Other seborrheic keratosis: Secondary | ICD-10-CM | POA: Diagnosis not present

## 2018-09-10 DIAGNOSIS — Z23 Encounter for immunization: Secondary | ICD-10-CM | POA: Diagnosis not present

## 2018-09-10 DIAGNOSIS — L570 Actinic keratosis: Secondary | ICD-10-CM | POA: Diagnosis not present

## 2018-09-30 ENCOUNTER — Encounter (HOSPITAL_COMMUNITY): Payer: Self-pay | Admitting: *Deleted

## 2018-10-05 DIAGNOSIS — Z1231 Encounter for screening mammogram for malignant neoplasm of breast: Secondary | ICD-10-CM | POA: Diagnosis not present

## 2018-10-05 DIAGNOSIS — Z01419 Encounter for gynecological examination (general) (routine) without abnormal findings: Secondary | ICD-10-CM | POA: Diagnosis not present

## 2018-11-10 ENCOUNTER — Telehealth: Payer: Self-pay | Admitting: Nurse Practitioner

## 2018-11-10 NOTE — Telephone Encounter (Signed)
I spoke to pt, and ajovy she stated has stopped working since 10/24/2018.  She has had 6-7 migraines since beginning of month.  imitrex does help.  No triggers that she can think of.  Aimovig worked for about 2 months.  She stated that maybe botox would work.  She stated she had this for cosmetic reasons before, not migraines.  Please advise.

## 2018-11-10 NOTE — Telephone Encounter (Signed)
Patient has been on Ajovy for 4 months and it has stopped working. She has had several migraines this month. Please call and discuss.

## 2018-11-11 NOTE — Telephone Encounter (Signed)
Called and scheduled the patient.  °

## 2018-11-11 NOTE — Telephone Encounter (Signed)
Dr. Jaynee Eagles agreed to do Botox since she has failed so many preventatives.  Please get PA she is medicare and set up with Jaynee Eagles. Thanks

## 2018-11-19 ENCOUNTER — Telehealth: Payer: Self-pay

## 2018-11-19 NOTE — Telephone Encounter (Signed)
Pending renewal for Ajovy PA ICD 10 code: O96.295 PA Case ID: [id number]

## 2018-11-22 NOTE — Telephone Encounter (Signed)
Request Reference Number: [id number]. AJOVY INJ 225/1.5 is approved through 11/24/2019. For further questions, call [phone].

## 2018-11-22 NOTE — Telephone Encounter (Signed)
Approval for ajovy 225mg /1.42ml thru 11/24/2019 nonformulary exception.  [facility] [phone] PA# [id number]. ID [id number].

## 2018-12-01 DIAGNOSIS — Z23 Encounter for immunization: Secondary | ICD-10-CM | POA: Diagnosis not present

## 2018-12-01 DIAGNOSIS — L821 Other seborrheic keratosis: Secondary | ICD-10-CM | POA: Diagnosis not present

## 2018-12-01 DIAGNOSIS — L570 Actinic keratosis: Secondary | ICD-10-CM | POA: Diagnosis not present

## 2018-12-24 ENCOUNTER — Ambulatory Visit: Payer: Medicare Other | Admitting: Neurology

## 2019-01-03 DIAGNOSIS — M7542 Impingement syndrome of left shoulder: Secondary | ICD-10-CM | POA: Diagnosis not present

## 2019-01-03 DIAGNOSIS — M25512 Pain in left shoulder: Secondary | ICD-10-CM | POA: Diagnosis not present

## 2019-01-04 ENCOUNTER — Other Ambulatory Visit: Payer: Self-pay | Admitting: Endocrinology

## 2019-01-04 DIAGNOSIS — M810 Age-related osteoporosis without current pathological fracture: Secondary | ICD-10-CM

## 2019-01-13 DIAGNOSIS — L821 Other seborrheic keratosis: Secondary | ICD-10-CM | POA: Diagnosis not present

## 2019-01-13 DIAGNOSIS — Z23 Encounter for immunization: Secondary | ICD-10-CM | POA: Diagnosis not present

## 2019-01-13 DIAGNOSIS — L570 Actinic keratosis: Secondary | ICD-10-CM | POA: Diagnosis not present
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
## 2019-01-17 ENCOUNTER — Ambulatory Visit (INDEPENDENT_AMBULATORY_CARE_PROVIDER_SITE_OTHER): Payer: Medicare Other | Admitting: Cardiology

## 2019-01-17 ENCOUNTER — Encounter: Payer: Self-pay | Admitting: Cardiology

## 2019-01-17 VITALS — BP 106/78 | HR 71 | Ht 63.0 in | Wt 142.0 lb

## 2019-01-17 DIAGNOSIS — I480 Paroxysmal atrial fibrillation: Secondary | ICD-10-CM

## 2019-01-17 DIAGNOSIS — Z7901 Long term (current) use of anticoagulants: Secondary | ICD-10-CM | POA: Diagnosis not present

## 2019-01-17 NOTE — Patient Instructions (Addendum)
Medication Instructions:  Your physician recommends that you continue on your current medications as directed. Please refer to the Current Medication list given to you today.  If you need a refill on your cardiac medications before your next appointment, please call your pharmacy.   Lab work: Your physician recommends that you have a CBC and CMP drawn today.   If you have labs (blood work) drawn today and your tests are completely normal, you will receive your results only by: Marland Kitchen MyChart Message (if you have MyChart) OR . A paper copy in the mail If you have any lab test that is abnormal or we need to change your treatment, we will call you to review the results.  Testing/Procedures: An EKG was performed  Follow-Up: At [facility], you and your health needs are our priority.  As part of our continuing mission to provide you with exceptional heart care, we have created designated Provider Care Teams.  These Care Teams include your primary Cardiologist (physician) and Advanced Practice Providers (APPs -  Physician Assistants and Nurse Practitioners) who all work together to provide you with the care you need, when you need it. You will need a follow up appointment in 1 years.  Please call our office 2 months in advance to schedule this appointment.    Any Other Special Instructions Will Be Listed Below     1. Avoid all over-the-counter antihistamines except Claritin/Loratadine and Zyrtec/Cetrizine. 2. Avoid all combination including cold sinus allergies flu decongestant and sleep medications 3. You can use Robitussin DM Mucinex and Mucinex DM for cough. 4. can use Tylenol aspirin ibuprofen and naproxen but no combinations such as sleep or sinus.

## 2019-01-18 LAB — CBC
Hematocrit: 40.4 % (ref 34.0–46.6)
Hemoglobin: 13.4 g/dL (ref 11.1–15.9)
MCH: 30.3 pg (ref 26.6–33.0)
MCHC: 33.2 g/dL (ref 31.5–35.7)
MCV: 91 fL (ref 79–97)
Platelets: 268 10*3/uL (ref 150–450)
RBC: 4.42 x10E6/uL (ref 3.77–5.28)
RDW: 12.6 % (ref 11.7–15.4)
WBC: 9.1 10*3/uL (ref 3.4–10.8)

## 2019-01-18 LAB — COMPREHENSIVE METABOLIC PANEL
ALT: 15 IU/L (ref 0–32)
AST: 21 IU/L (ref 0–40)
Albumin/Globulin Ratio: 2 (ref 1.2–2.2)
Albumin: 4.3 g/dL (ref 3.8–4.8)
Alkaline Phosphatase: 59 IU/L (ref 39–117)
BUN/Creatinine Ratio: 24 (ref 12–28)
BUN: 19 mg/dL (ref 8–27)
Bilirubin Total: 0.2 mg/dL (ref 0.0–1.2)
CO2: 25 mmol/L (ref 20–29)
Calcium: 8.9 mg/dL (ref 8.7–10.3)
Chloride: 107 mmol/L — ABNORMAL HIGH (ref 96–106)
Creatinine, Ser: 0.79 mg/dL (ref 0.57–1.00)
GFR calc Af Amer: 88 mL/min/{1.73_m2} (ref >59)
GFR calc non Af Amer: 77 mL/min/{1.73_m2} (ref >59)
Globulin, Total: 2.2 g/dL (ref 1.5–4.5)
Glucose: 90 mg/dL (ref 65–99)
Potassium: 4.6 mmol/L (ref 3.5–5.2)
Sodium: 144 mmol/L (ref 134–144)
Total Protein: 6.5 g/dL (ref 6.0–8.5)

## 2019-01-31 ENCOUNTER — Telehealth: Payer: Self-pay | Admitting: Nurse Practitioner

## 2019-01-31 NOTE — Telephone Encounter (Signed)
Pt would like to know if she is able to get BOTOX treatments reordered for her because her headaches have returned. Please advise.

## 2019-02-01 NOTE — Telephone Encounter (Signed)
Was scheduled with Dr. Jaynee Eagles.

## 2019-02-01 NOTE — Telephone Encounter (Signed)
I called pt not there, I LMVM for her to call back.  Husband was aware of her migraines have returned this last week.  She is interested in getting botox again.  She is on ajovy.

## 2019-02-01 NOTE — Telephone Encounter (Signed)
So check with Andee Poles to see who she was scheduled with for Botox and reschedule

## 2019-02-01 NOTE — Telephone Encounter (Signed)
Patient calling in returning call.

## 2019-02-01 NOTE — Telephone Encounter (Signed)
Spoke to pt.  She stated that she was approved for botox but appt 12-24-2018 was cancelled as she was better.  The last 17 days she has had daily headaches, 4-5 have been migraine.  She is wanted to proceed with botox.  She is on ajovy the last 5 months.  Please advise.  No change in insurance.

## 2019-02-02 NOTE — Telephone Encounter (Signed)
I called and scheduled the patient with Dr. Jaynee Eagles.

## 2019-02-08 DIAGNOSIS — M7542 Impingement syndrome of left shoulder: Secondary | ICD-10-CM | POA: Diagnosis not present

## 2019-02-11 DIAGNOSIS — M7542 Impingement syndrome of left shoulder: Secondary | ICD-10-CM | POA: Diagnosis not present

## 2019-02-16 ENCOUNTER — Ambulatory Visit: Payer: Medicare Other | Admitting: Internal Medicine

## 2019-02-21 ENCOUNTER — Ambulatory Visit: Payer: Medicare Other | Admitting: Internal Medicine

## 2019-02-23 ENCOUNTER — Ambulatory Visit: Payer: Medicare Other | Admitting: Neurology

## 2019-03-03 ENCOUNTER — Telehealth (INDEPENDENT_AMBULATORY_CARE_PROVIDER_SITE_OTHER): Payer: Medicare Other | Admitting: Internal Medicine

## 2019-03-03 ENCOUNTER — Telehealth: Payer: Self-pay

## 2019-03-03 DIAGNOSIS — I480 Paroxysmal atrial fibrillation: Secondary | ICD-10-CM
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
## 2019-03-03 NOTE — Telephone Encounter (Signed)
Left message for pt regarding appt on 03/03/19.

## 2019-03-03 NOTE — Telephone Encounter (Signed)
F/U Message          Patient is calling for her appt @ 9:20 she wants to be called on the home #

## 2019-03-04 ENCOUNTER — Encounter (HOSPITAL_COMMUNITY): Payer: Self-pay

## 2019-03-09 ENCOUNTER — Other Ambulatory Visit: Payer: Medicare Other

## 2019-03-11 ENCOUNTER — Ambulatory Visit: Payer: Medicare Other | Admitting: Internal Medicine

## 2019-03-23 ENCOUNTER — Other Ambulatory Visit: Payer: Self-pay | Admitting: Cardiology

## 2019-03-23 MED ORDER — ELIQUIS 5 MG PO TABS: 5.0000 mg | ORAL_TABLET | Freq: Two times a day (BID) | ORAL | 2 refills | Status: DC

## 2019-03-23 NOTE — Telephone Encounter (Signed)
°*  STAT* If patient is at the pharmacy, call can be transferred to refill team.   1. Which medications need to be refilled? (please list name of each medication and dose if known) ELIQUIS 5 MG TABS tablet   2. Which pharmacy/location (including street and city if local pharmacy) is medication to be sent to? [place], [place] [employer] [phone] (Phone) [phone] (Fax)     3. Do they need a 30 day or 90 day supply? 90 day

## 2019-03-23 NOTE — Telephone Encounter (Signed)
Refill for eliquis sent to [facility] as requested.  

## 2019-05-10 ENCOUNTER — Other Ambulatory Visit: Payer: Self-pay

## 2019-05-10 ENCOUNTER — Ambulatory Visit
Admission: RE | Admit: 2019-05-10 | Discharge: 2019-05-10 | Disposition: A | Discharge status: Home / Self Care | Payer: Medicare Other | Source: Ambulatory Visit | Attending: Endocrinology | Admitting: Endocrinology

## 2019-05-10 DIAGNOSIS — M810 Age-related osteoporosis without current pathological fracture: Secondary | ICD-10-CM

## 2019-05-10 DIAGNOSIS — Z780 Asymptomatic menopausal state: Secondary | ICD-10-CM | POA: Diagnosis not present

## 2019-05-10 DIAGNOSIS — M8589 Other specified disorders of bone density and structure, multiple sites: Secondary | ICD-10-CM | POA: Diagnosis not present

## 2019-05-19 ENCOUNTER — Ambulatory Visit (INDEPENDENT_AMBULATORY_CARE_PROVIDER_SITE_OTHER): Payer: Medicare Other | Admitting: Neurology

## 2019-05-19 ENCOUNTER — Other Ambulatory Visit: Payer: Self-pay

## 2019-05-19 DIAGNOSIS — G43709 Chronic migraine without aura, not intractable, without status migrainosus: Secondary | ICD-10-CM | POA: Diagnosis not present

## 2019-05-19 NOTE — Progress Notes (Signed)
Botox- 200 units x 1 vial Lot: [id number] Expiration: 01/2022 NDC: 1916-6060-04  Bacteriostatic 0.9% Sodium Chloride- 77mL total Lot: [id number] Expiration: 08/25/2019 NDC: 1423-9532-02  Dx: B34.356 B/B

## 2019-05-23 DIAGNOSIS — E559 Vitamin D deficiency, unspecified: Secondary | ICD-10-CM | POA: Diagnosis not present

## 2019-05-23 DIAGNOSIS — M810 Age-related osteoporosis without current pathological fracture: Secondary | ICD-10-CM | POA: Diagnosis not present

## 2019-05-23 NOTE — Progress Notes (Signed)
Consent Form Botulism Toxin Injection For Chronic Migraine    Reviewed orally with patient, additionally signature is on file:  Botulism toxin has been approved by the Federal drug administration for treatment of chronic migraine. Botulism toxin does not cure chronic migraine and it may not be effective in some patients.  The administration of botulism toxin is accomplished by injecting a small amount of toxin into the muscles of the neck and head. Dosage must be titrated for each individual. Any benefits resulting from botulism toxin tend to wear off after 3 months with a repeat injection required if benefit is to be maintained. Injections are usually done every 3-4 months with maximum effect peak achieved by about 2 or 3 weeks. Botulism toxin is expensive and you should be sure of what costs you will incur resulting from the injection.  The side effects of botulism toxin use for chronic migraine may include:   -Transient, and usually mild, facial weakness with facial injections  -Transient, and usually mild, head or neck weakness with head/neck injections  -Reduction or loss of forehead facial animation due to forehead muscle weakness  -Eyelid drooping  -Dry eye  -Pain at the site of injection or bruising at the site of injection  -Double vision  -Potential unknown long term risks  Contraindications: You should not have Botox if you are pregnant, nursing, allergic to albumin, have an infection, skin condition, or muscle weakness at the site of the injection, or have myasthenia gravis, Lambert-Eaton syndrome, or ALS.  It is also possible that as with any injection, there may be an allergic reaction or no effect from the medication. Reduced effectiveness after repeated injections is sometimes seen and rarely infection at the injection site may occur. All care will be taken to prevent these side effects. If therapy is given over a long time, atrophy and wasting in the muscle injected may  occur. Occasionally the patient's become refractory to treatment because they develop antibodies to the toxin. In this event, therapy needs to be modified.  I have read the above information and consent to the administration of botulism toxin.    BOTOX PROCEDURE NOTE FOR MIGRAINE HEADACHE    Contraindications and precautions discussed with patient(above). Aseptic procedure was observed and patient tolerated procedure. Procedure performed by Dr. Georgia Dom  The condition has existed for more than 6 months, and pt does not have a diagnosis of ALS, Myasthenia Gravis or Lambert-Eaton Syndrome.  Risks and benefits of injections discussed and pt agrees to proceed with the procedure.  Written consent obtained  These injections are medically necessary. Pt  receives good benefits from these injections. These injections do not cause sedations or hallucinations which the oral therapies may cause.  Description of procedure:  The patient was placed in a sitting position. The standard protocol was used for Botox as follows, with 5 units of Botox injected at each site:   -Procerus muscle, midline injection  -Corrugator muscle, bilateral injection  -Frontalis muscle, bilateral injection, with 2 sites each side, medial injection was performed in the upper one third of the frontalis muscle, in the region vertical from the medial inferior edge of the superior orbital rim. The lateral injection was again in the upper one third of the forehead vertically above the lateral limbus of the cornea, 1.5 cm lateral to the medial injection site.  - Levator Scapulae: 5 units bilaterally  -Temporalis muscle injection, 5 sites, bilaterally. The first injection was 3 cm above the tragus of the ear,  second injection site was 1.5 cm to 3 cm up from the first injection site in line with the tragus of the ear. The third injection site was 1.5-3 cm forward between the first 2 injection sites. The fourth injection site was  1.5 cm posterior to the second injection site. 5th site laterally in the temporalis  muscleat the level of the outer canthus.  - Patient feels her clenching is a trigger for headaches. +5 units masseter bilaterally   - Patient feels the migraines are centered around the eyes +5 units bilaterally at the outer canthus in the orbicularis occuli  -Occipitalis muscle injection, 3 sites, bilaterally. The first injection was done one half way between the occipital protuberance and the tip of the mastoid process behind the ear. The second injection site was done lateral and superior to the first, 1 fingerbreadth from the first injection. The third injection site was 1 fingerbreadth superiorly and medially from the first injection site.  -Cervical paraspinal muscle injection, 2 sites, bilateral knee first injection site was 1 cm from the midline of the cervical spine, 3 cm inferior to the lower border of the occipital protuberance. The second injection site was 1.5 cm superiorly and laterally to the first injection site.  -Trapezius muscle injection was performed at 3 sites, bilaterally. The first injection site was in the upper trapezius muscle halfway between the inflection point of the neck, and the acromion. The second injection site was one half way between the acromion and the first injection site. The third injection was done between the first injection site and the inflection point of the neck.   Will return for repeat injection in 3 months.   A 200 unit sof Botox was used, any Botox not injected was wasted. The patient tolerated the procedure well, there were no complications of the above procedure.

## 2019-05-25 DIAGNOSIS — M25512 Pain in left shoulder: Secondary | ICD-10-CM | POA: Diagnosis not present

## 2019-06-01 DIAGNOSIS — M25512 Pain in left shoulder: Secondary | ICD-10-CM | POA: Diagnosis not present

## 2019-06-03 DIAGNOSIS — I480 Paroxysmal atrial fibrillation: Secondary | ICD-10-CM | POA: Diagnosis not present

## 2019-06-03 DIAGNOSIS — G43909 Migraine, unspecified, not intractable, without status migrainosus: Secondary | ICD-10-CM | POA: Diagnosis not present

## 2019-06-03 DIAGNOSIS — Z131 Encounter for screening for diabetes mellitus: Secondary | ICD-10-CM | POA: Diagnosis not present

## 2019-06-03 DIAGNOSIS — Z13220 Encounter for screening for lipoid disorders: Secondary | ICD-10-CM | POA: Diagnosis not present

## 2019-06-03 DIAGNOSIS — Z23 Encounter for immunization: Secondary | ICD-10-CM | POA: Diagnosis not present

## 2019-06-03 DIAGNOSIS — Z1159 Encounter for screening for other viral diseases: Secondary | ICD-10-CM | POA: Diagnosis not present

## 2019-06-03 DIAGNOSIS — M810 Age-related osteoporosis without current pathological fracture: Secondary | ICD-10-CM | POA: Diagnosis not present

## 2019-06-03 DIAGNOSIS — Z7901 Long term (current) use of anticoagulants: Secondary | ICD-10-CM | POA: Diagnosis not present

## 2019-06-09 DIAGNOSIS — M7542 Impingement syndrome of left shoulder: Secondary | ICD-10-CM | POA: Diagnosis not present

## 2019-06-13 DIAGNOSIS — M7542 Impingement syndrome of left shoulder: Secondary | ICD-10-CM | POA: Diagnosis not present

## 2019-06-15 DIAGNOSIS — I480 Paroxysmal atrial fibrillation: Secondary | ICD-10-CM | POA: Diagnosis not present

## 2019-06-15 DIAGNOSIS — Z131 Encounter for screening for diabetes mellitus: Secondary | ICD-10-CM | POA: Diagnosis not present

## 2019-06-15 DIAGNOSIS — Z23 Encounter for immunization: Secondary | ICD-10-CM | POA: Diagnosis not present

## 2019-06-15 DIAGNOSIS — Z1159 Encounter for screening for other viral diseases: Secondary | ICD-10-CM | POA: Diagnosis not present

## 2019-06-15 DIAGNOSIS — Z13220 Encounter for screening for lipoid disorders: Secondary | ICD-10-CM | POA: Diagnosis not present

## 2019-06-15 DIAGNOSIS — Z7901 Long term (current) use of anticoagulants: Secondary | ICD-10-CM | POA: Diagnosis not present

## 2019-06-15 DIAGNOSIS — Z136 Encounter for screening for cardiovascular disorders: Secondary | ICD-10-CM | POA: Diagnosis not present

## 2019-06-15 DIAGNOSIS — M810 Age-related osteoporosis without current pathological fracture: Secondary | ICD-10-CM | POA: Diagnosis not present

## 2019-06-15 DIAGNOSIS — G43909 Migraine, unspecified, not intractable, without status migrainosus: Secondary | ICD-10-CM | POA: Diagnosis not present

## 2019-06-16 DIAGNOSIS — M7542 Impingement syndrome of left shoulder: Secondary | ICD-10-CM | POA: Diagnosis not present

## 2019-06-20 DIAGNOSIS — M7542 Impingement syndrome of left shoulder: Secondary | ICD-10-CM | POA: Diagnosis not present

## 2019-06-26 ENCOUNTER — Other Ambulatory Visit (HOSPITAL_COMMUNITY): Payer: Self-pay | Admitting: Nurse Practitioner

## 2019-07-04 ENCOUNTER — Ambulatory Visit: Payer: Medicare Other | Admitting: Neurology

## 2019-07-05 DIAGNOSIS — M7542 Impingement syndrome of left shoulder: Secondary | ICD-10-CM | POA: Diagnosis not present

## 2019-07-05 DIAGNOSIS — M25512 Pain in left shoulder: Secondary | ICD-10-CM | POA: Diagnosis not present

## 2019-07-11 ENCOUNTER — Other Ambulatory Visit: Payer: Self-pay

## 2019-07-11 MED ORDER — SUMATRIPTAN SUCCINATE 100 MG PO TABS: 100.0000 mg | ORAL_TABLET | ORAL | 6 refills | Status: DC | PRN

## 2019-07-14 DIAGNOSIS — H353111 Nonexudative age-related macular degeneration, right eye, early dry stage: Secondary | ICD-10-CM | POA: Diagnosis not present

## 2019-08-03 ENCOUNTER — Other Ambulatory Visit: Payer: Self-pay | Admitting: *Deleted

## 2019-08-03 MED ORDER — AJOVY 225 MG/1.5ML ~~LOC~~ SOSY: 225.0000 mg | PREFILLED_SYRINGE | SUBCUTANEOUS | 1 refills | Status: DC

## 2019-08-23 ENCOUNTER — Ambulatory Visit (INDEPENDENT_AMBULATORY_CARE_PROVIDER_SITE_OTHER): Payer: Medicare Other | Admitting: Neurology

## 2019-08-23 ENCOUNTER — Other Ambulatory Visit: Payer: Self-pay

## 2019-08-23 VITALS — Temp 98.2°F

## 2019-08-23 DIAGNOSIS — G43709 Chronic migraine without aura, not intractable, without status migrainosus: Secondary | ICD-10-CM

## 2019-08-23 NOTE — Progress Notes (Signed)
Botox- 200 units x 1 vial Lot: [id number] Expiration: 03/2022 NDC: [id number]  Bacteriostatic 0.9% Sodium Chloride- 3mL total Lot: [id number] Expiration: 08/25/2019 NDC: [id number]  Dx: [id number] B/B

## 2019-08-23 NOTE — Progress Notes (Signed)
Consent Form Botulism Toxin Injection For Chronic Migraine   Interval history 08/23/2019: Second botox. Patient's baseline is 20 headache days a month and >10 migraines. Tremendous improvement the first few months rare headaches, they almost totally went away >90% improvement. +a   Reviewed orally with patient, additionally signature is on file:  Botulism toxin has been approved by the Federal drug administration for treatment of chronic migraine. Botulism toxin does not cure chronic migraine and it may not be effective in some patients.  The administration of botulism toxin is accomplished by injecting a small amount of toxin into the muscles of the neck and head. Dosage must be titrated for each individual. Any benefits resulting from botulism toxin tend to wear off after 3 months with a repeat injection required if benefit is to be maintained. Injections are usually done every 3-4 months with maximum effect peak achieved by about 2 or 3 weeks. Botulism toxin is expensive and you should be sure of what costs you will incur resulting from the injection.  The side effects of botulism toxin use for chronic migraine may include:   -Transient, and usually mild, facial weakness with facial injections  -Transient, and usually mild, head or neck weakness with head/neck injections  -Reduction or loss of forehead facial animation due to forehead muscle weakness  -Eyelid drooping  -Dry eye  -Pain at the site of injection or bruising at the site of injection  -Double vision  -Potential unknown long term risks  Contraindications: You should not have Botox if you are pregnant, nursing, allergic to albumin, have an infection, skin condition, or muscle weakness at the site of the injection, or have myasthenia gravis, Lambert-Eaton syndrome, or ALS.  It is also possible that as with any injection, there may be an allergic reaction or no effect from the medication. Reduced effectiveness after  repeated injections is sometimes seen and rarely infection at the injection site may occur. All care will be taken to prevent these side effects. If therapy is given over a long time, atrophy and wasting in the muscle injected may occur. Occasionally the patient's become refractory to treatment because they develop antibodies to the toxin. In this event, therapy needs to be modified.  I have read the above information and consent to the administration of botulism toxin.    BOTOX PROCEDURE NOTE FOR MIGRAINE HEADACHE    Contraindications and precautions discussed with patient(above). Aseptic procedure was observed and patient tolerated procedure. Procedure performed by Dr. Georgia Dom  The condition has existed for more than 6 months, and pt does not have a diagnosis of ALS, Myasthenia Gravis or Lambert-Eaton Syndrome.  Risks and benefits of injections discussed and pt agrees to proceed with the procedure.  Written consent obtained  These injections are medically necessary. Pt  receives good benefits from these injections. These injections do not cause sedations or hallucinations which the oral therapies may cause.  Description of procedure:  The patient was placed in a sitting position. The standard protocol was used for Botox as follows, with 5 units of Botox injected at each site:   -Procerus muscle, midline injection  -Corrugator muscle, bilateral injection  -Frontalis muscle, bilateral injection, with 2 sites each side, medial injection was performed in the upper one third of the frontalis muscle, in the region vertical from the medial inferior edge of the superior orbital rim. The lateral injection was again in the upper one third of the forehead vertically above the lateral limbus of the  cornea, 1.5 cm lateral to the medial injection site.  -Temporalis muscle injection, 4 sites, bilaterally. The first injection was 3 cm above the tragus of the ear, second injection site was 1.5 cm to 3  cm up from the first injection site in line with the tragus of the ear. The third injection site was 1.5-3 cm forward between the first 2 injection sites. The fourth injection site was 1.5 cm posterior to the second injection site.   -Occipitalis muscle injection, 3 sites, bilaterally. The first injection was done one half way between the occipital protuberance and the tip of the mastoid process behind the ear. The second injection site was done lateral and superior to the first, 1 fingerbreadth from the first injection. The third injection site was 1 fingerbreadth superiorly and medially from the first injection site.  -Cervical paraspinal muscle injection, 2 sites, bilateral knee first injection site was 1 cm from the midline of the cervical spine, 3 cm inferior to the lower border of the occipital protuberance. The second injection site was 1.5 cm superiorly and laterally to the first injection site.  -Trapezius muscle injection was performed at 3 sites, bilaterally. The first injection site was in the upper trapezius muscle halfway between the inflection point of the neck, and the acromion. The second injection site was one half way between the acromion and the first injection site. The third injection was done between the first injection site and the inflection point of the neck.   Will return for repeat injection in 3 months.   200 units of Botox was used, any Botox not injected was wasted. The patient tolerated the procedure well, there were no complications of the above procedure.

## 2019-08-30 DIAGNOSIS — Z23 Encounter for immunization: Secondary | ICD-10-CM | POA: Diagnosis not present

## 2019-09-06 DIAGNOSIS — Z85828 Personal history of other malignant neoplasm of skin: Secondary | ICD-10-CM | POA: Diagnosis not present

## 2019-09-06 DIAGNOSIS — L821 Other seborrheic keratosis: Secondary | ICD-10-CM | POA: Diagnosis not present

## 2019-09-06 DIAGNOSIS — B078 Other viral warts: Secondary | ICD-10-CM | POA: Diagnosis not present

## 2019-09-06 DIAGNOSIS — Z23 Encounter for immunization: Secondary | ICD-10-CM | POA: Diagnosis not present

## 2019-09-06 DIAGNOSIS — L570 Actinic keratosis: Secondary | ICD-10-CM | POA: Diagnosis not present

## 2019-09-06 DIAGNOSIS — D485 Neoplasm of uncertain behavior of skin: Secondary | ICD-10-CM | POA: Diagnosis not present

## 2019-09-06 DIAGNOSIS — Q828 Other specified congenital malformations of skin: Secondary | ICD-10-CM | POA: Diagnosis not present

## 2019-09-06 DIAGNOSIS — L720 Epidermal cyst: Secondary | ICD-10-CM | POA: Diagnosis not present

## 2019-10-03 DIAGNOSIS — M79645 Pain in left finger(s): Secondary | ICD-10-CM | POA: Diagnosis not present

## 2019-10-17 DIAGNOSIS — Z1231 Encounter for screening mammogram for malignant neoplasm of breast: Secondary | ICD-10-CM | POA: Diagnosis not present

## 2019-10-18 ENCOUNTER — Other Ambulatory Visit (HOSPITAL_COMMUNITY): Payer: Self-pay | Admitting: Nurse Practitioner

## 2019-10-18 ENCOUNTER — Other Ambulatory Visit: Payer: Self-pay | Admitting: Cardiology

## 2019-10-25 DIAGNOSIS — M79645 Pain in left finger(s): Secondary | ICD-10-CM | POA: Diagnosis not present

## 2019-10-25 DIAGNOSIS — M1812 Unilateral primary osteoarthritis of first carpometacarpal joint, left hand: Secondary | ICD-10-CM | POA: Diagnosis not present

## 2019-11-16 ENCOUNTER — Other Ambulatory Visit: Payer: Self-pay

## 2019-11-16 ENCOUNTER — Ambulatory Visit (INDEPENDENT_AMBULATORY_CARE_PROVIDER_SITE_OTHER): Payer: Medicare Other | Admitting: Family Medicine

## 2019-11-16 DIAGNOSIS — G43009 Migraine without aura, not intractable, without status migrainosus: Secondary | ICD-10-CM

## 2019-11-16 DIAGNOSIS — G43709 Chronic migraine without aura, not intractable, without status migrainosus: Secondary | ICD-10-CM | POA: Diagnosis not present

## 2019-11-16 NOTE — Progress Notes (Signed)
B/B  Bacteriostatic 0.9% Sodium Chloride NDC: [id number] Exp: 02/23/2020  BOTOX 200 units NO NDC Sample 200 Units/Vial. [id number] Exp: [id number] [id number]

## 2019-11-16 NOTE — Progress Notes (Signed)
Consent Form Botulism Toxin Injection For Chronic Migraine    Reviewed orally with patient, additionally signature is on file:  Botulism toxin has been approved by the Federal drug administration for treatment of chronic migraine. Botulism toxin does not cure chronic migraine and it may not be effective in some patients.  The administration of botulism toxin is accomplished by injecting a small amount of toxin into the muscles of the neck and head. Dosage must be titrated for each individual. Any benefits resulting from botulism toxin tend to wear off after 3 months with a repeat injection required if benefit is to be maintained. Injections are usually done every 3-4 months with maximum effect peak achieved by about 2 or 3 weeks. Botulism toxin is expensive and you should be sure of what costs you will incur resulting from the injection.  The side effects of botulism toxin use for chronic migraine may include:   -Transient, and usually mild, facial weakness with facial injections  -Transient, and usually mild, head or neck weakness with head/neck injections  -Reduction or loss of forehead facial animation due to forehead muscle weakness  -Eyelid drooping  -Dry eye  -Pain at the site of injection or bruising at the site of injection  -Double vision  -Potential unknown long term risks   Contraindications: You should not have Botox if you are pregnant, nursing, allergic to albumin, have an infection, skin condition, or muscle weakness at the site of the injection, or have myasthenia gravis, Lambert-Eaton syndrome, or ALS.  It is also possible that as with any injection, there may be an allergic reaction or no effect from the medication. Reduced effectiveness after repeated injections is sometimes seen and rarely infection at the injection site may occur. All care will be taken to prevent these side effects. If therapy is given over a long time, atrophy and wasting in the muscle injected may  occur. Occasionally the patient's become refractory to treatment because they develop antibodies to the toxin. In this event, therapy needs to be modified.  I have read the above information and consent to the administration of botulism toxin.    BOTOX PROCEDURE NOTE FOR MIGRAINE HEADACHE  Contraindications and precautions discussed with patient(above). Aseptic procedure was observed and patient tolerated procedure. Procedure performed by Debbora Presto, FNP-C.   The condition has existed for more than 6 months, and pt does not have a diagnosis of ALS, Myasthenia Gravis or Lambert-Eaton Syndrome.  Risks and benefits of injections discussed and pt agrees to proceed with the procedure.  Written consent obtained  These injections are medically necessary. Pt  receives good benefits from these injections. These injections do not cause sedations or hallucinations which the oral therapies may cause.   Description of procedure:  The patient was placed in a sitting position. The standard protocol was used for Botox as follows, with 5 units of Botox injected at each site:  -Procerus muscle, midline injection  -Corrugator muscle, bilateral injection  -Frontalis muscle, bilateral injection, with 2 sites each side, medial injection was performed in the upper one third of the frontalis muscle, in the region vertical from the medial inferior edge of the superior orbital rim. The lateral injection was again in the upper one third of the forehead vertically above the lateral limbus of the cornea, 1.5 cm lateral to the medial injection site.  -Temporalis muscle injection, 4 sites, bilaterally. The first injection was 3 cm above the tragus of the ear, second injection site was 1.5 cm to  3 cm up from the first injection site in line with the tragus of the ear. The third injection site was 1.5-3 cm forward between the first 2 injection sites. The fourth injection site was 1.5 cm posterior to the second injection  site. 5th site laterally in the temporalis  muscleat the level of the outer canthus.  -Occipitalis muscle injection, 3 sites, bilaterally. The first injection was done one half way between the occipital protuberance and the tip of the mastoid process behind the ear. The second injection site was done lateral and superior to the first, 1 fingerbreadth from the first injection. The third injection site was 1 fingerbreadth superiorly and medially from the first injection site.  -Cervical paraspinal muscle injection, 2 sites, bilateral knee first injection site was 1 cm from the midline of the cervical spine, 3 cm inferior to the lower border of the occipital protuberance. The second injection site was 1.5 cm superiorly and laterally to the first injection site.  -Trapezius muscle injection was performed at 3 sites, bilaterally. The first injection site was in the upper trapezius muscle halfway between the inflection point of the neck, and the acromion. The second injection site was one half way between the acromion and the first injection site. The third injection was done between the first injection site and the inflection point of the neck.   Will return for repeat injection in 3 months.   A 155 units of Botox was used, any Botox not injected was wasted. The patient tolerated the procedure well, there were no complications of the above procedure.

## 2019-11-22 ENCOUNTER — Other Ambulatory Visit: Payer: Self-pay | Admitting: Neurology

## 2019-11-29 ENCOUNTER — Telehealth: Payer: Self-pay

## 2019-11-29 NOTE — Telephone Encounter (Signed)
I called pt and she stated that she has had for the last 10 days headaches.  3 days which were migraines.  She takes her tylenol 2 tabs on headache days then imitrex on migraine days and these have helped.  Has nausea takes phenergan.  She is wanting to let us know if anything more she can do.  On ajovy and BOTOX.

## 2019-11-29 NOTE — Telephone Encounter (Signed)
Patient left a voicemail stating that she received her Botox on 11-16-2019 and her Ajovy injection 11-23-2019. She stated that she has been having a headache every day 3 days have been migraines. Please advise.

## 2019-11-30 MED ORDER — NURTEC 75 MG PO TBDP: 75.0000 mg | ORAL_TABLET | Freq: Every day | ORAL | 11 refills | Status: DC | PRN

## 2019-11-30 NOTE — Telephone Encounter (Signed)
Please let her know that we could try a different abortive medication like Nurtec 75mg  as needed if she would like. Make sure there are no allergy concerns like runny nose, watery eyes, congestion, scratchy throat..etc. If so, Zyrtec or Benadryl may help. Have her really pump up water intake. Low sugar Gatorade may also help. Remind her that regular use of Tyleonol or other abortive medications can cause rebound headaches.

## 2019-11-30 NOTE — Addendum Note (Signed)
Addended byUbaldo Glassing, Dyami Umbach L on: 11/30/2019 10:35 AM   Modules accepted: Orders

## 2019-11-30 NOTE — Telephone Encounter (Signed)
Pt ok to proceed with nurtec 75mg  po daily prn acute onset migraine.  She asked about injection of botox in back of head where her headache is but I relayed that insurance would not approve.  I relayed SE nausea, rash, trouble breathing (serious SE).  Encouraged reading of SE from pharmacy when picked up.  May require PA as this is nwe drug, she has medicare.  She verbalized understanding.

## 2019-12-07 NOTE — Telephone Encounter (Signed)
Patient called to check on the status of the [place]. Patient states she was advised by the pharmacy a PA is needed for the medication.

## 2019-12-08 ENCOUNTER — Telehealth: Payer: Self-pay

## 2019-12-08 NOTE — Telephone Encounter (Signed)
Spoke to pt and relayed that was initiating CMM for her nurtec.  She has only tried sumatriptan of the 3 that was preferred. (rizatriptan and naratriptan are the others).  Determination pending.

## 2019-12-08 NOTE — Telephone Encounter (Signed)
Received a fax PA for pts Ajovy 225mg 1.24mL. Awaiting results   Key: [id number]

## 2019-12-14 ENCOUNTER — Encounter: Payer: Self-pay | Admitting: *Deleted

## 2019-12-14 ENCOUNTER — Telehealth: Payer: Self-pay | Admitting: Family Medicine

## 2019-12-14 NOTE — Telephone Encounter (Signed)
Pt called stating that she was to get a PA for her Rimegepant Sulfate (NURTEC) 75 MG TBDP but states she just got a call about her Ajovy getting denied. Pt is confused and is wanting to discuss with RN. Please advise.

## 2019-12-14 NOTE — Progress Notes (Signed)
IDID

## 2019-12-15 MED ORDER — RIZATRIPTAN BENZOATE 10 MG PO TBDP: 10.0000 mg | ORAL_TABLET | ORAL | 11 refills | Status: DC | PRN

## 2019-12-15 NOTE — Telephone Encounter (Signed)
Please let her know that I have called in rizatriptan for abortive care. She should take this just like she would take sumatriptan but can not use these medications together. She can take 1 at onset of migraine. May repeat 1 tablet in 2 hours if needed but no more than 2 in 24 hours or 10 per month.

## 2019-12-15 NOTE — Telephone Encounter (Signed)
I called pt and relayed that AJOVY is in appeal using different diagnosis. Determination pending.  The nurtec denied.  Needs to try other triptan.  Rizatriptan ordered to [facility].  #9 tablets.  Not to use with sumatriptan.  She verbalized understanding.

## 2019-12-15 NOTE — Telephone Encounter (Signed)
I received denial on the nurtec.  She has tried imitrex and is asking about naratriptan or rizatriptan.  Please advise.

## 2019-12-19 ENCOUNTER — Telehealth: Payer: Self-pay | Admitting: *Deleted

## 2019-12-19 NOTE — Telephone Encounter (Signed)
I made appt for her tomorrow at 1530.

## 2019-12-19 NOTE — Telephone Encounter (Signed)
We can definitely try Emgality. I have not seen her for follow up. I believe last office visit was in 2019. Let's get her back in the office for a follow up and we can discuss starting Emgality. She can schedule in office or virtual if she prefers.   TY!

## 2019-12-19 NOTE — Telephone Encounter (Signed)
I called pt and made appt for tomorrow at 1530 to evaluate more issues with migraines since last BOTOX injections.   Also discuss emgality as formulary with her [employer].  Last seen 2019 with CM?

## 2019-12-19 NOTE — Telephone Encounter (Signed)
Received needing more information relating to AJOVY.   Emgality now is on formulary.  Ajovy is not.  Any reason why she cannot take emgality?  She had SE with aimovig.  Please advise.

## 2019-12-20 ENCOUNTER — Ambulatory Visit (INDEPENDENT_AMBULATORY_CARE_PROVIDER_SITE_OTHER): Payer: Medicare Other | Admitting: Family Medicine

## 2019-12-20 ENCOUNTER — Encounter: Payer: Self-pay | Admitting: Family Medicine

## 2019-12-20 ENCOUNTER — Other Ambulatory Visit: Payer: Self-pay

## 2019-12-20 VITALS — BP 142/77 | HR 77 | Temp 97.4°F | Ht 63.0 in | Wt 140.0 lb

## 2019-12-20 DIAGNOSIS — G43009 Migraine without aura, not intractable, without status migrainosus: Secondary | ICD-10-CM

## 2019-12-20 NOTE — Progress Notes (Signed)
Botox- 100 units x 2 vials Lot: [id number] Expiration: 04/2021 NDC: [id number]  Bacteriostatic 0.9% Sodium Chloride- 92mL total Lot: [id number] Expiration: 02/23/2020 NDC: [id number]  Dx: [id number] SAMPLE

## 2019-12-20 NOTE — Patient Instructions (Signed)
We will continue Emgality, Botox and rizatriptan as prescribed.   Follow up for next botox procedure in March as directed.   Follow up for office visit in 1 year   Migraine Headache A migraine headache is a very strong throbbing pain on one side or both sides of your head. This type of headache can also cause other symptoms. It can last from 4 hours to 3 days. Talk with your doctor about what things may bring on (trigger) this condition. What are the causes? The exact cause of this condition is not known. This condition may be triggered or caused by:  Drinking alcohol.  Smoking.  Taking medicines, such as: ? Medicine used to treat chest pain (nitroglycerin). ? Birth control pills. ? Estrogen. ? Some blood pressure medicines.  Eating or drinking certain products.  Doing physical activity. Other things that may trigger a migraine headache include:  Having a menstrual period.  Pregnancy.  Hunger.  Stress.  Not getting enough sleep or getting too much sleep.  Weather changes.  Tiredness (fatigue). What increases the risk?  Being 17-82 years old.  Being female.  Having a family history of migraine headaches.  Being Caucasian.  Having depression or anxiety.  Being very overweight. What are the signs or symptoms?  A throbbing pain. This pain may: ? Happen in any area of the head, such as on one side or both sides. ? Make it hard to do daily activities. ? Get worse with physical activity. ? Get worse around bright lights or loud noises.  Other symptoms may include: ? Feeling sick to your stomach (nauseous). ? Vomiting. ? Dizziness. ? Being sensitive to bright lights, loud noises, or smells.  Before you get a migraine headache, you may get warning signs (an aura). An aura may include: ? Seeing flashing lights or having blind spots. ? Seeing bright spots, halos, or zigzag lines. ? Having tunnel vision or blurred vision. ? Having numbness or a tingling  feeling. ? Having trouble talking. ? Having weak muscles.  Some people have symptoms after a migraine headache (postdromal phase), such as: ? Tiredness. ? Trouble thinking (concentrating). How is this treated?  Taking medicines that: ? Relieve pain. ? Relieve the feeling of being sick to your stomach. ? Prevent migraine headaches.  Treatment may also include: ? Having acupuncture. ? Avoiding foods that bring on migraine headaches. ? Learning ways to control your body functions (biofeedback). ? Therapy to help you know and deal with negative thoughts (cognitive behavioral therapy). Follow these instructions at home: Medicines  Take over-the-counter and prescription medicines only as told by your doctor.  Ask your doctor if the medicine prescribed to you: ? Requires you to avoid driving or using heavy machinery. ? Can cause trouble pooping (constipation). You may need to take these steps to prevent or treat trouble pooping:  Drink enough fluid to keep your pee (urine) pale yellow.  Take over-the-counter or prescription medicines.  Eat foods that are high in fiber. These include beans, whole grains, and fresh fruits and vegetables.  Limit foods that are high in fat and sugar. These include fried or sweet foods. Lifestyle  Do not drink alcohol.  Do not use any products that contain nicotine or tobacco, such as cigarettes, e-cigarettes, and chewing tobacco. If you need help quitting, ask your doctor.  Get at least 8 hours of sleep every night.  Limit and deal with stress. General instructions      Keep a journal to find out what  may bring on your migraine headaches. For example, write down: ? What you eat and drink. ? How much sleep you get. ? Any change in what you eat or drink. ? Any change in your medicines.  If you have a migraine headache: ? Avoid things that make your symptoms worse, such as bright lights. ? It may help to lie down in a dark, quiet  room. ? Do not drive or use heavy machinery. ? Ask your doctor what activities are safe for you.  Keep all follow-up visits as told by your doctor. This is important. Contact a doctor if:  You get a migraine headache that is different or worse than others you have had.  You have more than 15 headache days in one month. Get help right away if:  Your migraine headache gets very bad.  Your migraine headache lasts longer than 72 hours.  You have a fever.  You have a stiff neck.  You have trouble seeing.  Your muscles feel weak or like you cannot control them.  You start to lose your balance a lot.  You start to have trouble walking.  You pass out (faint).  You have a seizure. Summary  A migraine headache is a very strong throbbing pain on one side or both sides of your head. These headaches can also cause other symptoms.  This condition may be treated with medicines and changes to your lifestyle.  Keep a journal to find out what may bring on your migraine headaches.  Contact a doctor if you get a migraine headache that is different or worse than others you have had.  Contact your doctor if you have more than 15 headache days in a month. This information is not intended to replace advice given to you by your health care provider. Make sure you discuss any questions you have with your health care provider. Document Revised: 03/04/2019 Document Reviewed: 12/23/2018 Elsevier Patient Education  [employer].
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
## 2019-12-21 ENCOUNTER — Encounter: Payer: Self-pay | Admitting: Family Medicine

## 2019-12-21 NOTE — Telephone Encounter (Signed)
Denied, going with emgality see Sandys phone note on 12/14/2019

## 2019-12-22 ENCOUNTER — Other Ambulatory Visit: Payer: Self-pay | Admitting: *Deleted

## 2019-12-22 MED ORDER — EMGALITY 120 MG/ML ~~LOC~~ SOAJ: 120.0000 mg | SUBCUTANEOUS | 3 refills | Status: DC

## 2019-12-26 ENCOUNTER — Encounter: Payer: Self-pay | Admitting: Family Medicine

## 2019-12-27 ENCOUNTER — Telehealth: Payer: Self-pay | Admitting: *Deleted

## 2019-12-27 NOTE — Telephone Encounter (Signed)
Initiated emgality PA on [facility] # [id number].

## 2019-12-29 NOTE — Telephone Encounter (Signed)
Received approval for emgality.  Pt took her ajovy on Monday.  Will start this in 30 days from last ajovy injection.  Approval for emgality thru 11-23-20.  Pt informed.

## 2020-01-02 MED ORDER — ELIQUIS 5 MG PO TABS: 5.0000 mg | ORAL_TABLET | Freq: Two times a day (BID) | ORAL | 0 refills | Status: DC

## 2020-01-23 ENCOUNTER — Encounter: Payer: Self-pay | Admitting: Family Medicine

## 2020-01-26 DIAGNOSIS — H2513 Age-related nuclear cataract, bilateral: Secondary | ICD-10-CM | POA: Diagnosis not present

## 2020-01-26 DIAGNOSIS — H25813 Combined forms of age-related cataract, bilateral: Secondary | ICD-10-CM | POA: Diagnosis not present

## 2020-02-01 DIAGNOSIS — Z7901 Long term (current) use of anticoagulants: Secondary | ICD-10-CM | POA: Diagnosis not present

## 2020-02-01 DIAGNOSIS — E7800 Pure hypercholesterolemia, unspecified: Secondary | ICD-10-CM | POA: Diagnosis not present

## 2020-02-14 ENCOUNTER — Telehealth: Payer: Self-pay | Admitting: Family Medicine

## 2020-02-14 NOTE — Telephone Encounter (Signed)
Patient has Medicare for Botox it will be B/B always for Botox 200 units Buy and Bill.

## 2020-02-21 ENCOUNTER — Encounter: Payer: Self-pay | Admitting: Family Medicine

## 2020-02-21 ENCOUNTER — Ambulatory Visit (INDEPENDENT_AMBULATORY_CARE_PROVIDER_SITE_OTHER): Payer: Medicare Other | Admitting: Family Medicine

## 2020-02-21 ENCOUNTER — Other Ambulatory Visit: Payer: Self-pay

## 2020-02-21 DIAGNOSIS — G43709 Chronic migraine without aura, not intractable, without status migrainosus: Secondary | ICD-10-CM

## 2020-02-21 DIAGNOSIS — G43009 Migraine without aura, not intractable, without status migrainosus: Secondary | ICD-10-CM | POA: Diagnosis not present

## 2020-02-21 NOTE — Progress Notes (Signed)
BOTOX 200 units B/B Lot - [id number] Exp-09/2022  SALINE 4cc [id number] EXP-02/23/2020 B/B

## 2020-02-21 NOTE — Progress Notes (Signed)
She reports reduction in migraine intensity and frequency following last visit in office for repeat botox in base of head and neck. She was able to get Emgality covered and took her first injection about 1 month ago. She is tolerating medication well.   Consent Form Botulism Toxin Injection For Chronic Migraine    Reviewed orally with patient, additionally signature is on file:  Botulism toxin has been approved by the Federal drug administration for treatment of chronic migraine. Botulism toxin does not cure chronic migraine and it may not be effective in some patients.  The administration of botulism toxin is accomplished by injecting a small amount of toxin into the muscles of the neck and head. Dosage must be titrated for each individual. Any benefits resulting from botulism toxin tend to wear off after 3 months with a repeat injection required if benefit is to be maintained. Injections are usually done every 3-4 months with maximum effect peak achieved by about 2 or 3 weeks. Botulism toxin is expensive and you should be sure of what costs you will incur resulting from the injection.  The side effects of botulism toxin use for chronic migraine may include:   -Transient, and usually mild, facial weakness with facial injections  -Transient, and usually mild, head or neck weakness with head/neck injections  -Reduction or loss of forehead facial animation due to forehead muscle weakness  -Eyelid drooping  -Dry eye  -Pain at the site of injection or bruising at the site of injection  -Double vision  -Potential unknown long term risks   Contraindications: You should not have Botox if you are pregnant, nursing, allergic to albumin, have an infection, skin condition, or muscle weakness at the site of the injection, or have myasthenia gravis, Lambert-Eaton syndrome, or ALS.  It is also possible that as with any injection, there may be an allergic reaction or no effect from the medication.  Reduced effectiveness after repeated injections is sometimes seen and rarely infection at the injection site may occur. All care will be taken to prevent these side effects. If therapy is given over a long time, atrophy and wasting in the muscle injected may occur. Occasionally the patient's become refractory to treatment because they develop antibodies to the toxin. In this event, therapy needs to be modified.  I have read the above information and consent to the administration of botulism toxin.    BOTOX PROCEDURE NOTE FOR MIGRAINE HEADACHE  Contraindications and precautions discussed with patient(above). Aseptic procedure was observed and patient tolerated procedure. Procedure performed by Debbora Presto, FNP-C.   The condition has existed for more than 6 months, and pt does not have a diagnosis of ALS, Myasthenia Gravis or Lambert-Eaton Syndrome.  Risks and benefits of injections discussed and pt agrees to proceed with the procedure.  Written consent obtained  These injections are medically necessary. Pt  receives good benefits from these injections. These injections do not cause sedations or hallucinations which the oral therapies may cause.   Description of procedure:  The patient was placed in a sitting position. The standard protocol was used for Botox as follows, with 5 units of Botox injected at each site:  -Procerus muscle, midline injection  -Corrugator muscle, bilateral injection  -Frontalis muscle, bilateral injection, with 2 sites each side, medial injection was performed in the upper one third of the frontalis muscle, in the region vertical from the medial inferior edge of the superior orbital rim. The lateral injection was again in the upper one third  of the forehead vertically above the lateral limbus of the cornea, 1.5 cm lateral to the medial injection site.  -Temporalis muscle injection, 4 sites, bilaterally. The first injection was 3 cm above the tragus of the ear, second  injection site was 1.5 cm to 3 cm up from the first injection site in line with the tragus of the ear. The third injection site was 1.5-3 cm forward between the first 2 injection sites. The fourth injection site was 1.5 cm posterior to the second injection site. 5th site laterally in the temporalis  muscleat the level of the outer canthus.  -Occipitalis muscle injection, 3 sites, bilaterally. The first injection was done one half way between the occipital protuberance and the tip of the mastoid process behind the ear. The second injection site was done lateral and superior to the first, 1 fingerbreadth from the first injection. The third injection site was 1 fingerbreadth superiorly and medially from the first injection site.  -Cervical paraspinal muscle injection, 2 sites, bilaterally. The first injection site was 1 cm from the midline of the cervical spine, 3 cm inferior to the lower border of the occipital protuberance. The second injection site was 1.5 cm superiorly and laterally to the first injection site.  -Trapezius muscle injection was performed at 3 sites, bilaterally. The first injection site was in the upper trapezius muscle halfway between the inflection point of the neck, and the acromion. The second injection site was one half way between the acromion and the first injection site. The third injection was done between the first injection site and the inflection point of the neck.   Will return for repeat injection in 3 months.   A total of 200 units of Botox was prepared, 155 units of Botox was injected as documented above, any Botox not injected was wasted. The patient tolerated the procedure well, there were no complications of the above procedure.  Agree with procedure, Sarina Ill MD

## 2020-02-27 ENCOUNTER — Telehealth (INDEPENDENT_AMBULATORY_CARE_PROVIDER_SITE_OTHER): Payer: Medicare Other | Admitting: Internal Medicine

## 2020-02-27 ENCOUNTER — Other Ambulatory Visit: Payer: Self-pay

## 2020-02-27 VITALS — Ht 63.0 in | Wt 142.0 lb

## 2020-02-27 DIAGNOSIS — D6869 Other thrombophilia: Secondary | ICD-10-CM

## 2020-02-27 DIAGNOSIS — I480 Paroxysmal atrial fibrillation: Secondary | ICD-10-CM | POA: Diagnosis not present
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
## 2020-03-02 DIAGNOSIS — M25512 Pain in left shoulder: Secondary | ICD-10-CM | POA: Diagnosis not present

## 2020-03-02 DIAGNOSIS — M79672 Pain in left foot: Secondary | ICD-10-CM | POA: Diagnosis not present

## 2020-03-13 ENCOUNTER — Other Ambulatory Visit: Payer: Self-pay | Admitting: Internal Medicine

## 2020-03-14 DIAGNOSIS — E7800 Pure hypercholesterolemia, unspecified: Secondary | ICD-10-CM | POA: Diagnosis not present

## 2020-03-14 DIAGNOSIS — M810 Age-related osteoporosis without current pathological fracture: Secondary | ICD-10-CM | POA: Diagnosis not present

## 2020-03-14 DIAGNOSIS — I480 Paroxysmal atrial fibrillation: Secondary | ICD-10-CM | POA: Diagnosis not present

## 2020-03-31 ENCOUNTER — Other Ambulatory Visit: Payer: Self-pay | Admitting: Internal Medicine

## 2020-04-02 DIAGNOSIS — M7542 Impingement syndrome of left shoulder: Secondary | ICD-10-CM | POA: Diagnosis not present

## 2020-04-02 DIAGNOSIS — M79672 Pain in left foot: Secondary | ICD-10-CM | POA: Diagnosis not present

## 2020-04-02 NOTE — Telephone Encounter (Signed)
Prescription refill request for Eliquis received.  Last office visit: 02/27/2020, Allred Scr: 0.71, 02/01/2020 Age: 69 y.o. Weight: 64.4 kg   Prescription refill sent.

## 2020-04-27 DIAGNOSIS — M79672 Pain in left foot: Secondary | ICD-10-CM | POA: Diagnosis not present

## 2020-04-27 DIAGNOSIS — M7752 Other enthesopathy of left foot: Secondary | ICD-10-CM | POA: Diagnosis not present

## 2020-05-02 DIAGNOSIS — M79672 Pain in left foot: Secondary | ICD-10-CM | POA: Diagnosis not present

## 2020-05-09 DIAGNOSIS — Z96641 Presence of right artificial hip joint: Secondary | ICD-10-CM | POA: Diagnosis not present

## 2020-05-09 DIAGNOSIS — Z471 Aftercare following joint replacement surgery: Secondary | ICD-10-CM | POA: Diagnosis not present

## 2020-05-17 ENCOUNTER — Telehealth (HOSPITAL_COMMUNITY): Payer: Self-pay | Admitting: *Deleted

## 2020-05-17 MED ORDER — LOSARTAN POTASSIUM 25 MG PO TABS: 25.0000 mg | ORAL_TABLET | Freq: Every day | ORAL | 3 refills | Status: DC

## 2020-05-17 NOTE — Telephone Encounter (Signed)
Pt called in with symptoms of dizziness associated with elevated BP. She checks her BP multiple times a day and when readings are in the 120s/70s she has no dizziness but when she has dizzy spells her BP runs 148/88. She has a Chad and has checked strips with dizziness and no afib is detected. Discussed with Roderic Palau NP will try starting losartan 25mg  once a day with follow up in 1 week for bmet/bp. Pt in agreement.

## 2020-05-24 ENCOUNTER — Other Ambulatory Visit: Payer: Self-pay

## 2020-05-24 ENCOUNTER — Ambulatory Visit (INDEPENDENT_AMBULATORY_CARE_PROVIDER_SITE_OTHER): Payer: Medicare Other | Admitting: Family Medicine

## 2020-05-24 ENCOUNTER — Ambulatory Visit (HOSPITAL_COMMUNITY)
Admission: RE | Admit: 2020-05-24 | Discharge: 2020-05-24 | Disposition: A | Discharge status: Home / Self Care | Payer: Medicare Other | Source: Ambulatory Visit | Attending: Nurse Practitioner | Admitting: Nurse Practitioner

## 2020-05-24 VITALS — BP 122/76

## 2020-05-24 DIAGNOSIS — G43709 Chronic migraine without aura, not intractable, without status migrainosus: Secondary | ICD-10-CM | POA: Diagnosis not present

## 2020-05-24 DIAGNOSIS — E559 Vitamin D deficiency, unspecified: Secondary | ICD-10-CM | POA: Insufficient documentation

## 2020-05-24 LAB — VITAMIN D 25 HYDROXY (VIT D DEFICIENCY, FRACTURES): Vit D, 25-Hydroxy: 36.68 ng/mL (ref 30–100)

## 2020-05-24 LAB — BASIC METABOLIC PANEL
Anion gap: 12 (ref 5–15)
BUN: 12 mg/dL (ref 8–23)
CO2: 25 mmol/L (ref 22–32)
Calcium: 9.5 mg/dL (ref 8.9–10.3)
Chloride: 107 mmol/L (ref 98–111)
Creatinine, Ser: 0.89 mg/dL (ref 0.44–1.00)
GFR calc Af Amer: >60 mL/min (ref >60)
GFR calc non Af Amer: >60 mL/min (ref >60)
Glucose, Bld: 112 mg/dL — ABNORMAL HIGH (ref 70–99)
Potassium: 4.9 mmol/L (ref 3.5–5.1)
Sodium: 144 mmol/L (ref 135–145)

## 2020-05-24 NOTE — Progress Notes (Signed)
She is doing well with Emgality, Botox and rizatriptan. No concerns since last procedure in 01/2020.   Consent Form Botulism Toxin Injection For Chronic Migraine    Reviewed orally with patient, additionally signature is on file:  Botulism toxin has been approved by the Federal drug administration for treatment of chronic migraine. Botulism toxin does not cure chronic migraine and it may not be effective in some patients.  The administration of botulism toxin is accomplished by injecting a small amount of toxin into the muscles of the neck and head. Dosage must be titrated for each individual. Any benefits resulting from botulism toxin tend to wear off after 3 months with a repeat injection required if benefit is to be maintained. Injections are usually done every 3-4 months with maximum effect peak achieved by about 2 or 3 weeks. Botulism toxin is expensive and you should be sure of what costs you will incur resulting from the injection.  The side effects of botulism toxin use for chronic migraine may include:   -Transient, and usually mild, facial weakness with facial injections  -Transient, and usually mild, head or neck weakness with head/neck injections  -Reduction or loss of forehead facial animation due to forehead muscle weakness  -Eyelid drooping  -Dry eye  -Pain at the site of injection or bruising at the site of injection  -Double vision  -Potential unknown long term risks   Contraindications: You should not have Botox if you are pregnant, nursing, allergic to albumin, have an infection, skin condition, or muscle weakness at the site of the injection, or have myasthenia gravis, Lambert-Eaton syndrome, or ALS.  It is also possible that as with any injection, there may be an allergic reaction or no effect from the medication. Reduced effectiveness after repeated injections is sometimes seen and rarely infection at the injection site may occur. All care will be taken to prevent  these side effects. If therapy is given over a long time, atrophy and wasting in the muscle injected may occur. Occasionally the patient's become refractory to treatment because they develop antibodies to the toxin. In this event, therapy needs to be modified.  I have read the above information and consent to the administration of botulism toxin.    BOTOX PROCEDURE NOTE FOR MIGRAINE HEADACHE  Contraindications and precautions discussed with patient(above). Aseptic procedure was observed and patient tolerated procedure. Procedure performed by Debbora Presto, FNP-C.   The condition has existed for more than 6 months, and pt does not have a diagnosis of ALS, Myasthenia Gravis or Lambert-Eaton Syndrome.  Risks and benefits of injections discussed and pt agrees to proceed with the procedure.  Written consent obtained  These injections are medically necessary. Pt  receives good benefits from these injections. These injections do not cause sedations or hallucinations which the oral therapies may cause.   Description of procedure:  The patient was placed in a sitting position. The standard protocol was used for Botox as follows, with 5 units of Botox injected at each site:  -Procerus muscle, midline injection  -Corrugator muscle, bilateral injection  -Frontalis muscle, bilateral injection, with 2 sites each side, medial injection was performed in the upper one third of the frontalis muscle, in the region vertical from the medial inferior edge of the superior orbital rim. The lateral injection was again in the upper one third of the forehead vertically above the lateral limbus of the cornea, 1.5 cm lateral to the medial injection site.  -Temporalis muscle injection, 4 sites, bilaterally. The  first injection was 3 cm above the tragus of the ear, second injection site was 1.5 cm to 3 cm up from the first injection site in line with the tragus of the ear. The third injection site was 1.5-3 cm forward between  the first 2 injection sites. The fourth injection site was 1.5 cm posterior to the second injection site. 5th site laterally in the temporalis  muscleat the level of the outer canthus.  -Occipitalis muscle injection, 3 sites, bilaterally. The first injection was done one half way between the occipital protuberance and the tip of the mastoid process behind the ear. The second injection site was done lateral and superior to the first, 1 fingerbreadth from the first injection. The third injection site was 1 fingerbreadth superiorly and medially from the first injection site.  -Cervical paraspinal muscle injection, 2 sites, bilaterally. The first injection site was 1 cm from the midline of the cervical spine, 3 cm inferior to the lower border of the occipital protuberance. The second injection site was 1.5 cm superiorly and laterally to the first injection site.  -Trapezius muscle injection was performed at 3 sites, bilaterally. The first injection site was in the upper trapezius muscle halfway between the inflection point of the neck, and the acromion. The second injection site was one half way between the acromion and the first injection site. The third injection was done between the first injection site and the inflection point of the neck.   Will return for repeat injection in 3 months.   A total of 200 units of Botox was prepared, 155 units of Botox was injected as documented above, any Botox not injected was wasted. The patient tolerated the procedure well, there were no complications of the above procedure.

## 2020-05-24 NOTE — Patient Instructions (Signed)
Christina [name]

## 2020-05-24 NOTE — Progress Notes (Signed)
Botox- 200 units x 1 vial Lot: [id number] Expiration: 01/21/2023 NDC: 4142-3953-20  Bacteriostatic 0.9% Sodium Chloride- 70mL total Lot: [id number] Expiration: 01/21/2021 NDC: 86168-372-90  Dx: S11.155 B/B

## 2020-06-08 DIAGNOSIS — M25512 Pain in left shoulder: Secondary | ICD-10-CM | POA: Diagnosis not present

## 2020-06-08 DIAGNOSIS — M7542 Impingement syndrome of left shoulder: Secondary | ICD-10-CM | POA: Diagnosis not present

## 2020-06-08 DIAGNOSIS — M79672 Pain in left foot: Secondary | ICD-10-CM | POA: Diagnosis not present

## 2020-06-13 DIAGNOSIS — R42 Dizziness and giddiness: Secondary | ICD-10-CM | POA: Diagnosis not present

## 2020-06-13 DIAGNOSIS — H8110 Benign paroxysmal vertigo, unspecified ear: Secondary | ICD-10-CM | POA: Diagnosis not present

## 2020-06-14 DIAGNOSIS — H81392 Other peripheral vertigo, left ear: Secondary | ICD-10-CM | POA: Diagnosis not present

## 2020-06-14 DIAGNOSIS — I10 Essential (primary) hypertension: Secondary | ICD-10-CM | POA: Diagnosis not present

## 2020-06-14 DIAGNOSIS — M6281 Muscle weakness (generalized): Secondary | ICD-10-CM | POA: Diagnosis not present

## 2020-06-14 DIAGNOSIS — R42 Dizziness and giddiness: Secondary | ICD-10-CM | POA: Diagnosis not present

## 2020-06-14 DIAGNOSIS — R262 Difficulty in walking, not elsewhere classified: Secondary | ICD-10-CM | POA: Diagnosis not present

## 2020-06-19 DIAGNOSIS — R262 Difficulty in walking, not elsewhere classified: Secondary | ICD-10-CM | POA: Diagnosis not present

## 2020-06-19 DIAGNOSIS — I10 Essential (primary) hypertension: Secondary | ICD-10-CM | POA: Diagnosis not present

## 2020-06-19 DIAGNOSIS — H81392 Other peripheral vertigo, left ear: Secondary | ICD-10-CM | POA: Diagnosis not present

## 2020-06-19 DIAGNOSIS — R42 Dizziness and giddiness: Secondary | ICD-10-CM | POA: Diagnosis not present

## 2020-06-19 DIAGNOSIS — M6281 Muscle weakness (generalized): Secondary | ICD-10-CM | POA: Diagnosis not present

## 2020-06-21 DIAGNOSIS — R262 Difficulty in walking, not elsewhere classified: Secondary | ICD-10-CM | POA: Diagnosis not present

## 2020-06-21 DIAGNOSIS — I10 Essential (primary) hypertension: Secondary | ICD-10-CM | POA: Diagnosis not present

## 2020-06-21 DIAGNOSIS — M6281 Muscle weakness (generalized): Secondary | ICD-10-CM | POA: Diagnosis not present

## 2020-06-21 DIAGNOSIS — R42 Dizziness and giddiness: Secondary | ICD-10-CM | POA: Diagnosis not present

## 2020-06-21 DIAGNOSIS — H81392 Other peripheral vertigo, left ear: Secondary | ICD-10-CM | POA: Diagnosis not present

## 2020-06-26 DIAGNOSIS — I10 Essential (primary) hypertension: Secondary | ICD-10-CM | POA: Diagnosis not present

## 2020-06-26 DIAGNOSIS — H81392 Other peripheral vertigo, left ear: Secondary | ICD-10-CM | POA: Diagnosis not present

## 2020-06-26 DIAGNOSIS — R42 Dizziness and giddiness: Secondary | ICD-10-CM | POA: Diagnosis not present

## 2020-06-26 DIAGNOSIS — R262 Difficulty in walking, not elsewhere classified: Secondary | ICD-10-CM | POA: Diagnosis not present

## 2020-06-26 DIAGNOSIS — M6281 Muscle weakness (generalized): Secondary | ICD-10-CM | POA: Diagnosis not present

## 2020-07-02 DIAGNOSIS — I10 Essential (primary) hypertension: Secondary | ICD-10-CM | POA: Diagnosis not present

## 2020-07-02 DIAGNOSIS — M6281 Muscle weakness (generalized): Secondary | ICD-10-CM | POA: Diagnosis not present

## 2020-07-02 DIAGNOSIS — R42 Dizziness and giddiness: Secondary | ICD-10-CM | POA: Diagnosis not present

## 2020-07-02 DIAGNOSIS — H81392 Other peripheral vertigo, left ear: Secondary | ICD-10-CM | POA: Diagnosis not present

## 2020-07-02 DIAGNOSIS — R262 Difficulty in walking, not elsewhere classified: Secondary | ICD-10-CM | POA: Diagnosis not present

## 2020-07-04 DIAGNOSIS — M25512 Pain in left shoulder: Secondary | ICD-10-CM | POA: Diagnosis not present

## 2020-07-04 DIAGNOSIS — M7542 Impingement syndrome of left shoulder: Secondary | ICD-10-CM | POA: Diagnosis not present

## 2020-07-06 DIAGNOSIS — R262 Difficulty in walking, not elsewhere classified: Secondary | ICD-10-CM | POA: Diagnosis not present

## 2020-07-06 DIAGNOSIS — M6281 Muscle weakness (generalized): Secondary | ICD-10-CM | POA: Diagnosis not present

## 2020-07-06 DIAGNOSIS — H81392 Other peripheral vertigo, left ear: Secondary | ICD-10-CM | POA: Diagnosis not present

## 2020-07-06 DIAGNOSIS — R42 Dizziness and giddiness: Secondary | ICD-10-CM | POA: Diagnosis not present

## 2020-07-06 DIAGNOSIS — I10 Essential (primary) hypertension: Secondary | ICD-10-CM | POA: Diagnosis not present

## 2020-07-09 ENCOUNTER — Ambulatory Visit: Payer: Medicare Other | Admitting: Internal Medicine

## 2020-07-09 DIAGNOSIS — M7542 Impingement syndrome of left shoulder: Secondary | ICD-10-CM | POA: Diagnosis not present

## 2020-07-18 DIAGNOSIS — M25512 Pain in left shoulder: Secondary | ICD-10-CM | POA: Diagnosis not present

## 2020-07-23 DIAGNOSIS — M25512 Pain in left shoulder: Secondary | ICD-10-CM | POA: Diagnosis not present

## 2020-07-25 DIAGNOSIS — H353111 Nonexudative age-related macular degeneration, right eye, early dry stage: Secondary | ICD-10-CM | POA: Diagnosis not present

## 2020-07-25 DIAGNOSIS — H353121 Nonexudative age-related macular degeneration, left eye, early dry stage: Secondary | ICD-10-CM | POA: Diagnosis not present

## 2020-07-25 DIAGNOSIS — H524 Presbyopia: Secondary | ICD-10-CM | POA: Diagnosis not present

## 2020-07-25 DIAGNOSIS — H2513 Age-related nuclear cataract, bilateral: Secondary | ICD-10-CM | POA: Diagnosis not present

## 2020-07-25 DIAGNOSIS — H5203 Hypermetropia, bilateral: Secondary | ICD-10-CM | POA: Diagnosis not present

## 2020-07-25 DIAGNOSIS — H52223 Regular astigmatism, bilateral: Secondary | ICD-10-CM | POA: Diagnosis not present

## 2020-07-25 DIAGNOSIS — H353131 Nonexudative age-related macular degeneration, bilateral, early dry stage: Secondary | ICD-10-CM | POA: Diagnosis not present

## 2020-07-26 ENCOUNTER — Ambulatory Visit (INDEPENDENT_AMBULATORY_CARE_PROVIDER_SITE_OTHER): Payer: Medicare Other | Admitting: Internal Medicine

## 2020-07-26 ENCOUNTER — Encounter: Payer: Self-pay | Admitting: Internal Medicine

## 2020-07-26 ENCOUNTER — Other Ambulatory Visit: Payer: Self-pay

## 2020-07-26 VITALS — BP 126/80 | HR 77 | Ht 63.0 in | Wt 144.0 lb

## 2020-07-26 DIAGNOSIS — I4819 Other persistent atrial fibrillation: Secondary | ICD-10-CM

## 2020-07-26 DIAGNOSIS — R002 Palpitations: Secondary | ICD-10-CM

## 2020-07-26 HISTORY — PX: OTHER SURGICAL HISTORY: SHX169

## 2020-07-26 NOTE — Patient Instructions (Addendum)
Medication Instructions:  Your physician recommends that you continue on your current medications as directed. Please refer to the Current Medication list given to you today.  Labwork: None ordered.  Testing/Procedures: None ordered.  Follow-Up:  Your physician wants you to follow-up in: 3 months with Dr. Rayann Heman.    November 07, 2020 at 2:45 pm at the [facility] office   Implantable Loop Recorder Placement, Care After This sheet gives you information about how to care for yourself after your procedure. Your health care provider may also give you more specific instructions. If you have problems or questions, contact your health care provider. What can I expect after the procedure? After the procedure, it is common to have:  Soreness or discomfort near the incision.  Some swelling or bruising near the incision.  Follow these instructions at home: Incision care  1.  Leave your outer dressing on for 24 hours.  After 24 hours you can remove your outer dressing and shower. 2. Leave adhesive strips in place. These skin closures may need to stay in place for 1-2 weeks. If adhesive strip edges start to loosen and curl up, you may trim the loose edges.  You may remove the strips if they have not fallen off after 2 weeks. 3. Check your incision area every day for signs of infection. Check for: a. Redness, swelling, or pain. b. Fluid or blood. c. Warmth. d. Pus or a bad smell. 4. Do not take baths, swim, or use a hot tub until your incision is completely healed. 5. If your wound site starts to bleed apply pressure.      If you have any questions/concerns please call the device clinic at [phone].  Activity  Return to your normal activities.  General instructions  Follow instructions from your health care provider about how to manage your implantable loop recorder and transmit the information. Learn how to activate a recording if this is necessary for your type of device.  Do  not go through a metal detection gate, and do not let someone hold a metal detector over your chest. Show your ID card.  Do not have an MRI unless you check with your health care provider first.  Take over-the-counter and prescription medicines only as told by your health care provider.  Keep all follow-up visits as told by your health care provider. This is important. Contact a health care provider if:  You have redness, swelling, or pain around your incision.  You have a fever.  You have pain that is not relieved by your pain medicine.  You have triggered your device because of fainting (syncope) or because of a heartbeat that feels like it is racing, slow, fluttering, or skipping (palpitations). Get help right away if you have:  Chest pain.  Difficulty breathing. Summary  After the procedure, it is common to have soreness or discomfort near the incision.  Change your dressing as told by your health care provider.  Follow instructions from your health care provider about how to manage your implantable loop recorder and transmit the information.  Keep all follow-up visits as told by your health care provider. This is important. This information is not intended to replace advice given to you by your health care provider. Make sure you discuss any questions you have with your health care provider. Document Released: 10/22/2015 Document Revised: 12/26/2017 Document Reviewed: 12/26/2017 Elsevier Patient Education  2020 [employer].
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
## 2020-08-01 DIAGNOSIS — M25512 Pain in left shoulder: Secondary | ICD-10-CM | POA: Diagnosis not present

## 2020-08-03 DIAGNOSIS — M25512 Pain in left shoulder: Secondary | ICD-10-CM | POA: Diagnosis not present

## 2020-08-06 DIAGNOSIS — M25512 Pain in left shoulder: Secondary | ICD-10-CM | POA: Diagnosis not present

## 2020-08-09 DIAGNOSIS — M25512 Pain in left shoulder: Secondary | ICD-10-CM | POA: Diagnosis not present

## 2020-08-11 ENCOUNTER — Other Ambulatory Visit (HOSPITAL_COMMUNITY): Payer: Self-pay | Admitting: Nurse Practitioner

## 2020-08-14 DIAGNOSIS — M25512 Pain in left shoulder: Secondary | ICD-10-CM | POA: Diagnosis not present

## 2020-08-20 DIAGNOSIS — M25512 Pain in left shoulder: Secondary | ICD-10-CM | POA: Diagnosis not present

## 2020-08-21 DIAGNOSIS — M7542 Impingement syndrome of left shoulder: Secondary | ICD-10-CM | POA: Diagnosis not present

## 2020-08-28 ENCOUNTER — Ambulatory Visit (INDEPENDENT_AMBULATORY_CARE_PROVIDER_SITE_OTHER): Payer: Medicare Other

## 2020-08-28 ENCOUNTER — Ambulatory Visit (INDEPENDENT_AMBULATORY_CARE_PROVIDER_SITE_OTHER): Payer: Medicare Other | Admitting: Family Medicine

## 2020-08-28 DIAGNOSIS — G43709 Chronic migraine without aura, not intractable, without status migrainosus: Secondary | ICD-10-CM | POA: Diagnosis not present

## 2020-08-28 DIAGNOSIS — G43009 Migraine without aura, not intractable, without status migrainosus: Secondary | ICD-10-CM

## 2020-08-28 DIAGNOSIS — I4819 Other persistent atrial fibrillation: Secondary | ICD-10-CM

## 2020-08-28 NOTE — Progress Notes (Signed)
Botox-100unitsx2 vials Lot: [id number] Expiration: 12/2022 NDC: 2481-8590-93   0.9% Sodium Chloride- 56mL total Lot: [id number] Expiration: 12/2021 NDC: 44695-072-25  Dx: CHRONIC MIGRAINE- G43.709 B/B or SP  Consent signed

## 2020-08-28 NOTE — Progress Notes (Addendum)
She continues to do well. She continues Botox and Emgality. Rizatriptan works well for abortive therapy. Average 1 migraine per month.   Consent Form Botulism Toxin Injection For Chronic Migraine    Reviewed orally with patient, additionally signature is on file:  Botulism toxin has been approved by the Federal drug administration for treatment of chronic migraine. Botulism toxin does not cure chronic migraine and it may not be effective in some patients.  The administration of botulism toxin is accomplished by injecting a small amount of toxin into the muscles of the neck and head. Dosage must be titrated for each individual. Any benefits resulting from botulism toxin tend to wear off after 3 months with a repeat injection required if benefit is to be maintained. Injections are usually done every 3-4 months with maximum effect peak achieved by about 2 or 3 weeks. Botulism toxin is expensive and you should be sure of what costs you will incur resulting from the injection.  The side effects of botulism toxin use for chronic migraine may include:   -Transient, and usually mild, facial weakness with facial injections  -Transient, and usually mild, head or neck weakness with head/neck injections  -Reduction or loss of forehead facial animation due to forehead muscle weakness  -Eyelid drooping  -Dry eye  -Pain at the site of injection or bruising at the site of injection  -Double vision  -Potential unknown long term risks   Contraindications: You should not have Botox if you are pregnant, nursing, allergic to albumin, have an infection, skin condition, or muscle weakness at the site of the injection, or have myasthenia gravis, Lambert-Eaton syndrome, or ALS.  It is also possible that as with any injection, there may be an allergic reaction or no effect from the medication. Reduced effectiveness after repeated injections is sometimes seen and rarely infection at the injection site may occur.  All care will be taken to prevent these side effects. If therapy is given over a long time, atrophy and wasting in the muscle injected may occur. Occasionally the patient's become refractory to treatment because they develop antibodies to the toxin. In this event, therapy needs to be modified.  I have read the above information and consent to the administration of botulism toxin.    BOTOX PROCEDURE NOTE FOR MIGRAINE HEADACHE  Contraindications and precautions discussed with patient(above). Aseptic procedure was observed and patient tolerated procedure. Procedure performed by Debbora Presto, FNP-C.   The condition has existed for more than 6 months, and pt does not have a diagnosis of ALS, Myasthenia Gravis or Lambert-Eaton Syndrome.  Risks and benefits of injections discussed and pt agrees to proceed with the procedure.  Written consent obtained  These injections are medically necessary. Pt  receives good benefits from these injections. These injections do not cause sedations or hallucinations which the oral therapies may cause.   Description of procedure:  The patient was placed in a sitting position. The standard protocol was used for Botox as follows, with 5 units of Botox injected at each site:  -Procerus muscle, midline injection  -Corrugator muscle, bilateral injection  -Frontalis muscle, bilateral injection, with 2 sites each side, medial injection was performed in the upper one third of the frontalis muscle, in the region vertical from the medial inferior edge of the superior orbital rim. The lateral injection was again in the upper one third of the forehead vertically above the lateral limbus of the cornea, 1.5 cm lateral to the medial injection site.  -Temporalis muscle  injection, 4 sites, bilaterally. The first injection was 3 cm above the tragus of the ear, second injection site was 1.5 cm to 3 cm up from the first injection site in line with the tragus of the ear. The third injection  site was 1.5-3 cm forward between the first 2 injection sites. The fourth injection site was 1.5 cm posterior to the second injection site. 5th site laterally in the temporalis  muscleat the level of the outer canthus.  -Occipitalis muscle injection, 3 sites, bilaterally. The first injection was done one half way between the occipital protuberance and the tip of the mastoid process behind the ear. The second injection site was done lateral and superior to the first, 1 fingerbreadth from the first injection. The third injection site was 1 fingerbreadth superiorly and medially from the first injection site.  -Cervical paraspinal muscle injection, 2 sites, bilaterally. The first injection site was 1 cm from the midline of the cervical spine, 3 cm inferior to the lower border of the occipital protuberance. The second injection site was 1.5 cm superiorly and laterally to the first injection site.  -Trapezius muscle injection was performed at 3 sites, bilaterally. The first injection site was in the upper trapezius muscle halfway between the inflection point of the neck, and the acromion. The second injection site was one half way between the acromion and the first injection site. The third injection was done between the first injection site and the inflection point of the neck.   Will return for repeat injection in 3 months.   A total of 200 units of Botox was prepared, 155 units of Botox was injected as documented above, any Botox not injected was wasted. The patient tolerated the procedure well, there were no complications of the above procedure.  Made any corrections needed, and agree with procedure  Sarina Ill, MD [facility]

## 2020-08-29 LAB — CUP PACEART REMOTE DEVICE CHECK
Date Time Interrogation Session: 20211005095847
Implantable Pulse Generator Implant Date: 20210902

## 2020-08-31 DIAGNOSIS — Z23 Encounter for immunization: Secondary | ICD-10-CM | POA: Diagnosis not present

## 2020-08-31 NOTE — Progress Notes (Signed)
Carelink Summary Report / Loop Recorder 

## 2020-09-04 DIAGNOSIS — I480 Paroxysmal atrial fibrillation: Secondary | ICD-10-CM | POA: Diagnosis not present

## 2020-09-04 DIAGNOSIS — M810 Age-related osteoporosis without current pathological fracture: Secondary | ICD-10-CM | POA: Diagnosis not present

## 2020-09-04 DIAGNOSIS — E7800 Pure hypercholesterolemia, unspecified: Secondary | ICD-10-CM | POA: Diagnosis not present

## 2020-09-07 ENCOUNTER — Other Ambulatory Visit: Payer: Self-pay | Admitting: Internal Medicine

## 2020-09-18 DIAGNOSIS — Z23 Encounter for immunization: Secondary | ICD-10-CM | POA: Diagnosis not present

## 2020-09-18 DIAGNOSIS — L820 Inflamed seborrheic keratosis: Secondary | ICD-10-CM | POA: Diagnosis not present

## 2020-09-18 DIAGNOSIS — L821 Other seborrheic keratosis: Secondary | ICD-10-CM | POA: Diagnosis not present

## 2020-09-18 DIAGNOSIS — Z411 Encounter for cosmetic surgery: Secondary | ICD-10-CM | POA: Diagnosis not present

## 2020-09-18 DIAGNOSIS — L578 Other skin changes due to chronic exposure to nonionizing radiation: Secondary | ICD-10-CM | POA: Diagnosis not present

## 2020-09-18 DIAGNOSIS — L720 Epidermal cyst: Secondary | ICD-10-CM | POA: Diagnosis not present

## 2020-09-18 DIAGNOSIS — L570 Actinic keratosis: Secondary | ICD-10-CM | POA: Diagnosis not present

## 2020-09-18 DIAGNOSIS — D225 Melanocytic nevi of trunk: Secondary | ICD-10-CM | POA: Diagnosis not present

## 2020-09-18 DIAGNOSIS — Z85828 Personal history of other malignant neoplasm of skin: Secondary | ICD-10-CM | POA: Diagnosis not present

## 2020-09-18 DIAGNOSIS — Q828 Other specified congenital malformations of skin: Secondary | ICD-10-CM | POA: Diagnosis not present

## 2020-09-24 ENCOUNTER — Telehealth: Payer: Self-pay

## 2020-09-24 NOTE — Telephone Encounter (Signed)
Spoke to patient. States device moved about 1/2 an inch. Denies any redness, drainage or pain to site. Carelink website checked and is up to date. Advised patient to call device clinic with any further questions or concerns. Verbalized understanding.

## 2020-09-24 NOTE — Telephone Encounter (Signed)
The pt states her loop device has moved downwards since implant. I asked her did it move a lot or just a little? She states it moved about a half an inch or so. I asked have she lost some weight? She states No. I told her the device can move a little.

## 2020-09-26 DIAGNOSIS — Z23 Encounter for immunization: Secondary | ICD-10-CM | POA: Diagnosis not present

## 2020-09-30 ENCOUNTER — Other Ambulatory Visit: Payer: Self-pay | Admitting: Internal Medicine

## 2020-10-01 ENCOUNTER — Ambulatory Visit (INDEPENDENT_AMBULATORY_CARE_PROVIDER_SITE_OTHER): Payer: Medicare Other

## 2020-10-01 DIAGNOSIS — I480 Paroxysmal atrial fibrillation: Secondary | ICD-10-CM | POA: Diagnosis not present

## 2020-10-01 LAB — CUP PACEART REMOTE DEVICE CHECK
Date Time Interrogation Session: 20211107095921
Implantable Pulse Generator Implant Date: 20210902

## 2020-10-01 NOTE — Telephone Encounter (Signed)
Pt last saw Dr Rayann Heman 07/26/20, last labs 05/24/20 Creat 0.89, age 69, weight 65.3kg, based on specified criteria pt is on appropriate dosage of Eliquis 5mg  BID.  Will refill rx.

## 2020-10-02 NOTE — Progress Notes (Signed)
Carelink Summary Report / Loop Recorder 

## 2020-11-05 ENCOUNTER — Ambulatory Visit (INDEPENDENT_AMBULATORY_CARE_PROVIDER_SITE_OTHER): Payer: Medicare Other | Admitting: Internal Medicine

## 2020-11-05 ENCOUNTER — Ambulatory Visit (INDEPENDENT_AMBULATORY_CARE_PROVIDER_SITE_OTHER): Payer: Medicare Other

## 2020-11-05 ENCOUNTER — Other Ambulatory Visit: Payer: Self-pay

## 2020-11-05 ENCOUNTER — Encounter: Payer: Self-pay | Admitting: Internal Medicine

## 2020-11-05 VITALS — BP 100/68 | HR 75 | Ht 63.0 in | Wt 148.4 lb

## 2020-11-05 DIAGNOSIS — I4819 Other persistent atrial fibrillation: Secondary | ICD-10-CM

## 2020-11-05 DIAGNOSIS — I480 Paroxysmal atrial fibrillation: Secondary | ICD-10-CM

## 2020-11-05 DIAGNOSIS — I4892 Unspecified atrial flutter: Secondary | ICD-10-CM

## 2020-11-05 DIAGNOSIS — R002 Palpitations: Secondary | ICD-10-CM | POA: Diagnosis not present

## 2020-11-05 MED ORDER — ATENOLOL 25 MG PO TABS
12.5000 mg | ORAL_TABLET | Freq: Every day | ORAL | 0 refills | Status: DC
Start: 2020-11-05 — End: 2021-02-06
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
## 2020-11-05 NOTE — Patient Instructions (Addendum)
Medication Instructions:  Reduce your atenolol to 12.5 mg daily in 2 months, stop atenolol   *If you need a refill on your cardiac medications before your next appointment, please call your pharmacy*  Lab Work: None ordered.  If you have labs (blood work) drawn today and your tests are completely normal, you will receive your results only by: Marland Kitchen MyChart Message (if you have MyChart) OR . A paper copy in the mail If you have any lab test that is abnormal or we need to change your treatment, we will call you to review the results.  Testing/Procedures: None ordered.  Follow-Up: At [facility], you and your health needs are our priority.  As part of our continuing mission to provide you with exceptional heart care, we have created designated Provider Care Teams.  These Care Teams include your primary Cardiologist (physician) and Advanced Practice Providers (APPs -  Physician Assistants and Nurse Practitioners) who all work together to provide you with the care you need, when you need it.  We recommend signing up for the patient portal called "MyChart".  Sign up information is provided on this After Visit Summary.  MyChart is used to connect with patients for Virtual Visits (Telemedicine).  Patients are able to view lab/test results, encounter notes, upcoming appointments, etc.  Non-urgent messages can be sent to your provider as well.   To learn more about what you can do with MyChart, go to [email or web address].    Your next appointment:   Your physician wants you to follow-up in: 02/06/21 at 2:30 pm with Dr. Rayann Heman.    Other Instructions:

## 2020-11-06 LAB — CUP PACEART REMOTE DEVICE CHECK
Date Time Interrogation Session: 20211210095656
Implantable Pulse Generator Implant Date: 20210902

## 2020-11-07 ENCOUNTER — Encounter: Payer: Medicare Other | Admitting: Internal Medicine

## 2020-11-07 NOTE — Addendum Note (Signed)
Addended by: Saddie Benders E on: 11/07/2020 10:01 AM   Modules accepted: Orders

## 2020-11-19 NOTE — Progress Notes (Signed)
Carelink Summary Report / Loop Recorder 

## 2020-11-28 NOTE — Progress Notes (Unsigned)
She continues to do well. She continues Botox and Emgality. Rizatriptan works well for abortive therapy. Average 1 migraine per month.   Consent Form Botulism Toxin Injection For Chronic Migraine    Reviewed orally with patient, additionally signature is on file:  Botulism toxin has been approved by the Federal drug administration for treatment of chronic migraine. Botulism toxin does not cure chronic migraine and it may not be effective in some patients.  The administration of botulism toxin is accomplished by injecting a small amount of toxin into the muscles of the neck and head. Dosage must be titrated for each individual. Any benefits resulting from botulism toxin tend to wear off after 3 months with a repeat injection required if benefit is to be maintained. Injections are usually done every 3-4 months with maximum effect peak achieved by about 2 or 3 weeks. Botulism toxin is expensive and you should be sure of what costs you will incur resulting from the injection.  The side effects of botulism toxin use for chronic migraine may include:   -Transient, and usually mild, facial weakness with facial injections  -Transient, and usually mild, head or neck weakness with head/neck injections  -Reduction or loss of forehead facial animation due to forehead muscle weakness  -Eyelid drooping  -Dry eye  -Pain at the site of injection or bruising at the site of injection  -Double vision  -Potential unknown long term risks   Contraindications: You should not have Botox if you are pregnant, nursing, allergic to albumin, have an infection, skin condition, or muscle weakness at the site of the injection, or have myasthenia gravis, Lambert-Eaton syndrome, or ALS.  It is also possible that as with any injection, there may be an allergic reaction or no effect from the medication. Reduced effectiveness after repeated injections is sometimes seen and rarely infection at the injection site may occur.  All care will be taken to prevent these side effects. If therapy is given over a long time, atrophy and wasting in the muscle injected may occur. Occasionally the patient's become refractory to treatment because they develop antibodies to the toxin. In this event, therapy needs to be modified.  I have read the above information and consent to the administration of botulism toxin.    BOTOX PROCEDURE NOTE FOR MIGRAINE HEADACHE  Contraindications and precautions discussed with patient(above). Aseptic procedure was observed and patient tolerated procedure. Procedure performed by Debbora Presto, FNP-C.   The condition has existed for more than 6 months, and pt does not have a diagnosis of ALS, Myasthenia Gravis or Lambert-Eaton Syndrome.  Risks and benefits of injections discussed and pt agrees to proceed with the procedure.  Written consent obtained  These injections are medically necessary. Pt  receives good benefits from these injections. These injections do not cause sedations or hallucinations which the oral therapies may cause.   Description of procedure:  The patient was placed in a sitting position. The standard protocol was used for Botox as follows, with 5 units of Botox injected at each site:  -Procerus muscle, midline injection  -Corrugator muscle, bilateral injection  -Frontalis muscle, bilateral injection, with 2 sites each side, medial injection was performed in the upper one third of the frontalis muscle, in the region vertical from the medial inferior edge of the superior orbital rim. The lateral injection was again in the upper one third of the forehead vertically above the lateral limbus of the cornea, 1.5 cm lateral to the medial injection site.  -Temporalis muscle  injection, 4 sites, bilaterally. The first injection was 3 cm above the tragus of the ear, second injection site was 1.5 cm to 3 cm up from the first injection site in line with the tragus of the ear. The third injection  site was 1.5-3 cm forward between the first 2 injection sites. The fourth injection site was 1.5 cm posterior to the second injection site. 5th site laterally in the temporalis  muscleat the level of the outer canthus.  -Occipitalis muscle injection, 3 sites, bilaterally. The first injection was done one half way between the occipital protuberance and the tip of the mastoid process behind the ear. The second injection site was done lateral and superior to the first, 1 fingerbreadth from the first injection. The third injection site was 1 fingerbreadth superiorly and medially from the first injection site.  -Cervical paraspinal muscle injection, 2 sites, bilaterally. The first injection site was 1 cm from the midline of the cervical spine, 3 cm inferior to the lower border of the occipital protuberance. The second injection site was 1.5 cm superiorly and laterally to the first injection site.  -Trapezius muscle injection was performed at 3 sites, bilaterally. The first injection site was in the upper trapezius muscle halfway between the inflection point of the neck, and the acromion. The second injection site was one half way between the acromion and the first injection site. The third injection was done between the first injection site and the inflection point of the neck.   Will return for repeat injection in 3 months.   A total of 200 units of Botox was prepared, 155 units of Botox was injected as documented above, any Botox not injected was wasted. The patient tolerated the procedure well, there were no complications of the above procedure.

## 2020-11-29 ENCOUNTER — Other Ambulatory Visit: Payer: Self-pay

## 2020-11-29 ENCOUNTER — Ambulatory Visit (INDEPENDENT_AMBULATORY_CARE_PROVIDER_SITE_OTHER): Payer: Medicare Other | Admitting: Family Medicine

## 2020-11-29 DIAGNOSIS — G43009 Migraine without aura, not intractable, without status migrainosus: Secondary | ICD-10-CM | POA: Diagnosis not present

## 2020-11-29 DIAGNOSIS — G43709 Chronic migraine without aura, not intractable, without status migrainosus: Secondary | ICD-10-CM | POA: Diagnosis not present

## 2020-11-29 MED ORDER — RIZATRIPTAN BENZOATE 10 MG PO TBDP: 10.0000 mg | ORAL_TABLET | ORAL | 11 refills | Status: DC | PRN

## 2020-11-29 MED ORDER — EMGALITY 120 MG/ML ~~LOC~~ SOAJ: 120.0000 mg | SUBCUTANEOUS | 3 refills | Status: DC

## 2020-11-29 NOTE — Progress Notes (Signed)
Botox- 200 units x 1 vial Lot: [id number] Expiration: 07/2023 NDC: [id number]   Bacteriostatic 0.9% Sodium Chloride- 33mL total [id number] Expiration: 02/23 NDC: 63893-734-28   b/b

## 2020-12-04 ENCOUNTER — Other Ambulatory Visit: Payer: Self-pay | Admitting: Endocrinology

## 2020-12-06 DIAGNOSIS — Z1231 Encounter for screening mammogram for malignant neoplasm of breast: Secondary | ICD-10-CM | POA: Diagnosis not present

## 2020-12-08 ENCOUNTER — Other Ambulatory Visit: Payer: Self-pay | Admitting: Endocrinology

## 2020-12-08 DIAGNOSIS — M810 Age-related osteoporosis without current pathological fracture: Secondary | ICD-10-CM

## 2020-12-10 ENCOUNTER — Ambulatory Visit (INDEPENDENT_AMBULATORY_CARE_PROVIDER_SITE_OTHER): Payer: Medicare Other

## 2020-12-10 DIAGNOSIS — I480 Paroxysmal atrial fibrillation: Secondary | ICD-10-CM

## 2020-12-12 LAB — CUP PACEART REMOTE DEVICE CHECK
Date Time Interrogation Session: 20220112095352
Implantable Pulse Generator Implant Date: 20210902

## 2020-12-17 DIAGNOSIS — R42 Dizziness and giddiness: Secondary | ICD-10-CM | POA: Diagnosis not present

## 2020-12-17 DIAGNOSIS — M79671 Pain in right foot: Secondary | ICD-10-CM | POA: Diagnosis not present

## 2020-12-17 DIAGNOSIS — I480 Paroxysmal atrial fibrillation: Secondary | ICD-10-CM | POA: Diagnosis not present

## 2020-12-17 DIAGNOSIS — E7800 Pure hypercholesterolemia, unspecified: Secondary | ICD-10-CM | POA: Diagnosis not present

## 2020-12-17 DIAGNOSIS — M810 Age-related osteoporosis without current pathological fracture: Secondary | ICD-10-CM | POA: Diagnosis not present

## 2020-12-17 DIAGNOSIS — G43909 Migraine, unspecified, not intractable, without status migrainosus: Secondary | ICD-10-CM | POA: Diagnosis not present

## 2020-12-18 DIAGNOSIS — M79671 Pain in right foot: Secondary | ICD-10-CM | POA: Diagnosis not present

## 2020-12-18 DIAGNOSIS — I480 Paroxysmal atrial fibrillation: Secondary | ICD-10-CM | POA: Diagnosis not present

## 2020-12-18 DIAGNOSIS — E7800 Pure hypercholesterolemia, unspecified: Secondary | ICD-10-CM | POA: Diagnosis not present

## 2020-12-18 DIAGNOSIS — M810 Age-related osteoporosis without current pathological fracture: Secondary | ICD-10-CM | POA: Diagnosis not present

## 2020-12-24 NOTE — Progress Notes (Signed)
Carelink Summary Report / Loop Recorder 

## 2021-01-10 LAB — CUP PACEART REMOTE DEVICE CHECK
Date Time Interrogation Session: 20220214095739
Implantable Pulse Generator Implant Date: 20210902

## 2021-01-14 ENCOUNTER — Ambulatory Visit (INDEPENDENT_AMBULATORY_CARE_PROVIDER_SITE_OTHER): Payer: Medicare Other

## 2021-01-14 DIAGNOSIS — I480 Paroxysmal atrial fibrillation: Secondary | ICD-10-CM

## 2021-01-16 DIAGNOSIS — M25562 Pain in left knee: Secondary | ICD-10-CM | POA: Diagnosis not present

## 2021-01-16 DIAGNOSIS — M19072 Primary osteoarthritis, left ankle and foot: Secondary | ICD-10-CM | POA: Diagnosis not present

## 2021-01-17 DIAGNOSIS — M722 Plantar fascial fibromatosis: Secondary | ICD-10-CM | POA: Diagnosis not present

## 2021-01-17 NOTE — Progress Notes (Signed)
Carelink Summary Report / Loop Recorder 

## 2021-02-06 ENCOUNTER — Ambulatory Visit: Payer: Medicare Other | Admitting: Internal Medicine

## 2021-02-06 ENCOUNTER — Encounter: Payer: Self-pay | Admitting: Internal Medicine

## 2021-02-06 ENCOUNTER — Other Ambulatory Visit: Payer: Self-pay

## 2021-02-06 ENCOUNTER — Other Ambulatory Visit (HOSPITAL_COMMUNITY): Payer: Self-pay | Admitting: Nurse Practitioner

## 2021-02-06 VITALS — BP 124/74 | HR 106 | Ht 63.0 in | Wt 148.6 lb

## 2021-02-06 DIAGNOSIS — I10 Essential (primary) hypertension: Secondary | ICD-10-CM | POA: Diagnosis not present

## 2021-02-06 DIAGNOSIS — I4892 Unspecified atrial flutter: Secondary | ICD-10-CM

## 2021-02-06 DIAGNOSIS — I480 Paroxysmal atrial fibrillation: Secondary | ICD-10-CM

## 2021-02-06 NOTE — Patient Instructions (Addendum)
Medication Instructions:  Your physician recommends that you continue on your current medications as directed. Please refer to the Current Medication list given to you today.  Labwork: None ordered.  Testing/Procedures: None ordered.  Follow-Up: Your physician wants you to follow-up in: 08/12/21 at 2:15 pm with Thompson Grayer, MD    Any Other Special Instructions Will Be Listed Below (If Applicable).  If you need a refill on your cardiac medications before your next appointment, please call your pharmacy.
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
## 2021-02-16 LAB — CUP PACEART REMOTE DEVICE CHECK
Date Time Interrogation Session: 20220319095559
Implantable Pulse Generator Implant Date: 20210902

## 2021-02-18 ENCOUNTER — Ambulatory Visit (INDEPENDENT_AMBULATORY_CARE_PROVIDER_SITE_OTHER): Payer: Medicare Other

## 2021-02-18 DIAGNOSIS — I480 Paroxysmal atrial fibrillation: Secondary | ICD-10-CM

## 2021-02-26 NOTE — Progress Notes (Signed)
02/27/2021 ALL: She continues to do well on Botox. Greater than 50% reduction in migraines. Emgality and rizatriptan working well.   11/29/2020 ALL: She continues to do well. She continues Botox and Emgality. Rizatriptan works well for abortive therapy. Average 1 migraine per month.   Consent Form Botulism Toxin Injection For Chronic Migraine    Reviewed orally with patient, additionally signature is on file:  Botulism toxin has been approved by the Federal drug administration for treatment of chronic migraine. Botulism toxin does not cure chronic migraine and it may not be effective in some patients.  The administration of botulism toxin is accomplished by injecting a small amount of toxin into the muscles of the neck and head. Dosage must be titrated for each individual. Any benefits resulting from botulism toxin tend to wear off after 3 months with a repeat injection required if benefit is to be maintained. Injections are usually done every 3-4 months with maximum effect peak achieved by about 2 or 3 weeks. Botulism toxin is expensive and you should be sure of what costs you will incur resulting from the injection.  The side effects of botulism toxin use for chronic migraine may include:   -Transient, and usually mild, facial weakness with facial injections  -Transient, and usually mild, head or neck weakness with head/neck injections  -Reduction or loss of forehead facial animation due to forehead muscle weakness  -Eyelid drooping  -Dry eye  -Pain at the site of injection or bruising at the site of injection  -Double vision  -Potential unknown long term risks   Contraindications: You should not have Botox if you are pregnant, nursing, allergic to albumin, have an infection, skin condition, or muscle weakness at the site of the injection, or have myasthenia gravis, Lambert-Eaton syndrome, or ALS.  It is also possible that as with any injection, there may be an allergic reaction or no  effect from the medication. Reduced effectiveness after repeated injections is sometimes seen and rarely infection at the injection site may occur. All care will be taken to prevent these side effects. If therapy is given over a long time, atrophy and wasting in the muscle injected may occur. Occasionally the patient's become refractory to treatment because they develop antibodies to the toxin. In this event, therapy needs to be modified.  I have read the above information and consent to the administration of botulism toxin.    BOTOX PROCEDURE NOTE FOR MIGRAINE HEADACHE  Contraindications and precautions discussed with patient(above). Aseptic procedure was observed and patient tolerated procedure. Procedure performed by Debbora Presto, FNP-C.   The condition has existed for more than 6 months, and pt does not have a diagnosis of ALS, Myasthenia Gravis or Lambert-Eaton Syndrome.  Risks and benefits of injections discussed and pt agrees to proceed with the procedure.  Written consent obtained  These injections are medically necessary. Pt  receives good benefits from these injections. These injections do not cause sedations or hallucinations which the oral therapies may cause.   Description of procedure:  The patient was placed in a sitting position. The standard protocol was used for Botox as follows, with 5 units of Botox injected at each site:  -Procerus muscle, midline injection  -Corrugator muscle, bilateral injection  -Frontalis muscle, bilateral injection, with 2 sites each side, medial injection was performed in the upper one third of the frontalis muscle, in the region vertical from the medial inferior edge of the superior orbital rim. The lateral injection was again in the upper  one third of the forehead vertically above the lateral limbus of the cornea, 1.5 cm lateral to the medial injection site.  -Temporalis muscle injection, 4 sites, bilaterally. The first injection was 3 cm above the  tragus of the ear, second injection site was 1.5 cm to 3 cm up from the first injection site in line with the tragus of the ear. The third injection site was 1.5-3 cm forward between the first 2 injection sites. The fourth injection site was 1.5 cm posterior to the second injection site. 5th site laterally in the temporalis  muscleat the level of the outer canthus.  -Occipitalis muscle injection, 3 sites, bilaterally. The first injection was done one half way between the occipital protuberance and the tip of the mastoid process behind the ear. The second injection site was done lateral and superior to the first, 1 fingerbreadth from the first injection. The third injection site was 1 fingerbreadth superiorly and medially from the first injection site.  -Cervical paraspinal muscle injection, 2 sites, bilaterally. The first injection site was 1 cm from the midline of the cervical spine, 3 cm inferior to the lower border of the occipital protuberance. The second injection site was 1.5 cm superiorly and laterally to the first injection site.  -Trapezius muscle injection was performed at 3 sites, bilaterally. The first injection site was in the upper trapezius muscle halfway between the inflection point of the neck, and the acromion. The second injection site was one half way between the acromion and the first injection site. The third injection was done between the first injection site and the inflection point of the neck.   Will return for repeat injection in 3 months.   A total of 200 units of Botox was prepared, 155 units of Botox was injected as documented above, any Botox not injected was wasted. The patient tolerated the procedure well, there were no complications of the above procedure.

## 2021-02-27 ENCOUNTER — Ambulatory Visit: Payer: Medicare Other | Admitting: Family Medicine

## 2021-02-27 ENCOUNTER — Other Ambulatory Visit: Payer: Self-pay

## 2021-02-27 DIAGNOSIS — G43709 Chronic migraine without aura, not intractable, without status migrainosus: Secondary | ICD-10-CM | POA: Diagnosis not present

## 2021-02-27 NOTE — Progress Notes (Signed)
Botox- 200 units x 1 vial Lot: [id number] Expiration: 10/2023 NDC: 6815-9470-76  Bacteriostatic 0.9% Sodium Chloride- 77mL total [id number] Expiration: 2/23 NDC: 78978-478-41  Dx: Q82.081 BB

## 2021-02-28 NOTE — Progress Notes (Signed)
Carelink Summary Report / Loop Recorder 

## 2021-03-04 ENCOUNTER — Encounter: Payer: Self-pay | Admitting: Plastic Surgery

## 2021-03-04 ENCOUNTER — Ambulatory Visit (INDEPENDENT_AMBULATORY_CARE_PROVIDER_SITE_OTHER): Payer: Self-pay | Admitting: Plastic Surgery

## 2021-03-04 ENCOUNTER — Other Ambulatory Visit: Payer: Self-pay

## 2021-03-04 DIAGNOSIS — Z719 Counseling, unspecified: Secondary | ICD-10-CM
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
## 2021-03-07 DIAGNOSIS — Z719 Counseling, unspecified: Secondary | ICD-10-CM

## 2021-03-14 ENCOUNTER — Ambulatory Visit (INDEPENDENT_AMBULATORY_CARE_PROVIDER_SITE_OTHER): Payer: Medicare Other

## 2021-03-14 DIAGNOSIS — I4819 Other persistent atrial fibrillation: Secondary | ICD-10-CM | POA: Diagnosis not present

## 2021-03-14 LAB — CUP PACEART REMOTE DEVICE CHECK
Date Time Interrogation Session: 20220421095620
Implantable Pulse Generator Implant Date: 20210902

## 2021-04-02 NOTE — Progress Notes (Signed)
Carelink Summary Report / Loop Recorder 

## 2021-04-16 ENCOUNTER — Ambulatory Visit (INDEPENDENT_AMBULATORY_CARE_PROVIDER_SITE_OTHER): Payer: Medicare Other

## 2021-04-16 DIAGNOSIS — I4819 Other persistent atrial fibrillation: Secondary | ICD-10-CM | POA: Diagnosis not present

## 2021-04-17 LAB — CUP PACEART REMOTE DEVICE CHECK
Date Time Interrogation Session: 20220524095410
Implantable Pulse Generator Implant Date: 20210902

## 2021-04-18 DIAGNOSIS — M25562 Pain in left knee: Secondary | ICD-10-CM | POA: Diagnosis not present

## 2021-04-29 DIAGNOSIS — M810 Age-related osteoporosis without current pathological fracture: Secondary | ICD-10-CM | POA: Diagnosis not present

## 2021-04-29 DIAGNOSIS — I480 Paroxysmal atrial fibrillation: Secondary | ICD-10-CM | POA: Diagnosis not present

## 2021-04-29 DIAGNOSIS — E7800 Pure hypercholesterolemia, unspecified: Secondary | ICD-10-CM | POA: Diagnosis not present

## 2021-04-29 DIAGNOSIS — I10 Essential (primary) hypertension: Secondary | ICD-10-CM | POA: Diagnosis not present

## 2021-04-30 ENCOUNTER — Encounter: Payer: Self-pay | Admitting: Family Medicine

## 2021-04-30 ENCOUNTER — Encounter: Payer: Self-pay | Admitting: *Deleted

## 2021-04-30 ENCOUNTER — Other Ambulatory Visit: Payer: Self-pay | Admitting: Family Medicine

## 2021-04-30 ENCOUNTER — Telehealth: Payer: Self-pay | Admitting: *Deleted

## 2021-04-30 DIAGNOSIS — G43009 Migraine without aura, not intractable, without status migrainosus: Secondary | ICD-10-CM

## 2021-04-30 MED ORDER — NARATRIPTAN HCL 2.5 MG PO TABS: 2.5000 mg | ORAL_TABLET | ORAL | 0 refills | Status: DC | PRN

## 2021-04-30 MED ORDER — NURTEC 75 MG PO TBDP: 75.0000 mg | ORAL_TABLET | Freq: Every day | ORAL | 11 refills | Status: DC | PRN

## 2021-04-30 NOTE — Telephone Encounter (Signed)
Nurtec approved through 11/23/21 as non- formulary exception. Sent my chart to inform patient.

## 2021-04-30 NOTE — Telephone Encounter (Signed)
Nurtec PA, key: [id number], [id number], failed sumatriptan. Your information has been sent to [facility].

## 2021-05-07 ENCOUNTER — Ambulatory Visit (INDEPENDENT_AMBULATORY_CARE_PROVIDER_SITE_OTHER): Payer: Self-pay | Admitting: Surgical

## 2021-05-07 ENCOUNTER — Encounter: Payer: Self-pay | Admitting: Family Medicine

## 2021-05-07 ENCOUNTER — Other Ambulatory Visit: Payer: Self-pay

## 2021-05-07 ENCOUNTER — Other Ambulatory Visit: Payer: Self-pay | Admitting: Neurology

## 2021-05-07 ENCOUNTER — Encounter: Payer: Self-pay | Admitting: Surgical

## 2021-05-07 ENCOUNTER — Telehealth: Payer: Self-pay | Admitting: *Deleted

## 2021-05-07 VITALS — BP 134/75 | HR 97 | Ht 63.0 in | Wt 145.0 lb

## 2021-05-07 DIAGNOSIS — G43001 Migraine without aura, not intractable, with status migrainosus: Secondary | ICD-10-CM | POA: Diagnosis not present

## 2021-05-07 DIAGNOSIS — Z719 Counseling, unspecified: Secondary | ICD-10-CM

## 2021-05-07 MED ORDER — HYDROCODONE-ACETAMINOPHEN 5-325 MG PO TABS: 1.0000 | ORAL_TABLET | Freq: Four times a day (QID) | ORAL | 0 refills | Status: AC | PRN

## 2021-05-07 MED ORDER — CEPHALEXIN 500 MG PO CAPS
500.0000 mg | ORAL_CAPSULE | Freq: Four times a day (QID) | ORAL | 0 refills | Status: AC
Start: 2021-05-07 — End: 2021-05-10

## 2021-05-07 MED ORDER — METHYLPREDNISOLONE 4 MG PO TBPK: ORAL_TABLET | ORAL | 1 refills | Status: DC

## 2021-05-07 MED ORDER — ONDANSETRON HCL 4 MG PO TABS: 4.0000 mg | ORAL_TABLET | Freq: Three times a day (TID) | ORAL | 0 refills | Status: DC | PRN

## 2021-05-07 NOTE — Telephone Encounter (Signed)
    Christina [name] DOB:  12-05-1950  MRN:  [id number]   Primary Cardiologist: Dr. Rayann Heman  Chart reviewed as part of pre-operative protocol coverage. Given past medical history and time since last visit, based on ACC/AHA guidelines, Christina [name] would be at acceptable risk for the planned procedure without further cardiovascular testing.   She was last seen by Dr. Rayann Heman in follow and was was doing very well from a CV standpoint. She has a loop implanted and is s/p ablation off AAD. Loop interrogation showed no afib by ILR (reviewed today). Palpitations by ILR (symptomatic transmissions) were for PVCs with an overall burden of PVCs at 0.4%. No changes were made at that time. She has no hx of CAD.   The patient was advised that if she develops new symptoms prior to surgery to contact our office to arrange for a follow-up visit, and she verbalized understanding.  I will route this recommendation to the requesting party via Epic fax function and remove from pre-op pool.  Please call with questions.  Kathyrn Drown, NP 05/07/2021, 12:06 PM

## 2021-05-07 NOTE — Telephone Encounter (Signed)
Spoke with Christina [name] who has ordered a migraine infusion of Depacon 1000 mg IV x 1, Toradol 30 mg IV x 1, and Solumedrol 125 mg IV x 1. Spoke with patient, confirmed her allergies, and scheduled her for 3 pm today. Pt aware of the meds to be infused and is agreeable to plan. She was very Patent attorney. Orders written, signed by Christina [name], and given to [facility] RN in [place] along with insurance information and allergy/med list.
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
## 2021-05-07 NOTE — Telephone Encounter (Signed)
   [facility] Pre-operative Risk Assessment    Patient Name: Christina [name]  DOB: 12-09-1950  MRN: [id number]   HEARTCARE STAFF: - Please ensure there is not already an duplicate clearance open for this procedure. - Under Visit Info/Reason for Call, type in Other and utilize the format Clearance MM/DD/YY or Clearance TBD. Do not use dashes or single digits. - If request is for dental extraction, please clarify the # of teeth to be extracted. - If the patient is currently at the dentist's office, call Pre-Op APP to address. If the patient is not currently in the dentist office, please route to the Pre-Op pool  Request for surgical clearance:  What type of surgery is being performed? MINI ABDOMINOPLASTY WITH LIPOSUCTION   When is this surgery scheduled? 06/03/21   What type of clearance is required (medical clearance vs. Pharmacy clearance to hold med vs. Both)? MEDICAL  Are there any medications that need to be held prior to surgery and how long? NONE LISTED  Practice name and name of physician performing surgery? [facility]; DR. Audelia Hives   What is the office phone number? [phone]   7.   What is the office fax number? [phone]  8.   Anesthesia type (None, local, MAC, general) ? GENERAL   Julaine Hua 05/07/2021, 12:00 PM  _________________________________________________________________   (provider comments below)

## 2021-05-08 NOTE — Progress Notes (Signed)
Carelink Summary Report / Loop Recorder 

## 2021-05-20 ENCOUNTER — Ambulatory Visit (INDEPENDENT_AMBULATORY_CARE_PROVIDER_SITE_OTHER): Payer: Medicare Other

## 2021-05-20 DIAGNOSIS — I4819 Other persistent atrial fibrillation: Secondary | ICD-10-CM | POA: Diagnosis not present

## 2021-05-21 DIAGNOSIS — L570 Actinic keratosis: Secondary | ICD-10-CM | POA: Diagnosis not present

## 2021-05-21 LAB — CUP PACEART REMOTE DEVICE CHECK
Date Time Interrogation Session: 20220626095437
Implantable Pulse Generator Implant Date: 20210902

## 2021-05-24 ENCOUNTER — Other Ambulatory Visit: Payer: Self-pay

## 2021-05-24 ENCOUNTER — Ambulatory Visit
Admission: RE | Admit: 2021-05-24 | Discharge: 2021-05-24 | Disposition: A | Discharge status: Home / Self Care | Payer: Medicare Other | Source: Ambulatory Visit | Attending: Endocrinology | Admitting: Endocrinology

## 2021-05-24 DIAGNOSIS — M810 Age-related osteoporosis without current pathological fracture: Secondary | ICD-10-CM

## 2021-05-24 DIAGNOSIS — Z780 Asymptomatic menopausal state: Secondary | ICD-10-CM | POA: Diagnosis not present

## 2021-05-24 DIAGNOSIS — M8588 Other specified disorders of bone density and structure, other site: Secondary | ICD-10-CM | POA: Diagnosis not present

## 2021-05-28 ENCOUNTER — Telehealth: Payer: Self-pay | Admitting: Family Medicine

## 2021-05-28 NOTE — Telephone Encounter (Signed)
I called patient and LVM regarding r/s her 7/7 Botox appointment with Amy. Amy is out this week.

## 2021-05-30 ENCOUNTER — Ambulatory Visit: Payer: Medicare Other | Admitting: Family Medicine

## 2021-06-05 ENCOUNTER — Ambulatory Visit: Payer: Self-pay | Admitting: Family Medicine

## 2021-06-10 NOTE — Progress Notes (Signed)
Carelink Summary Report / Loop Recorder 

## 2021-06-11 ENCOUNTER — Other Ambulatory Visit: Payer: Self-pay

## 2021-06-11 ENCOUNTER — Encounter: Payer: Self-pay | Admitting: Plastic Surgery

## 2021-06-11 ENCOUNTER — Ambulatory Visit (INDEPENDENT_AMBULATORY_CARE_PROVIDER_SITE_OTHER): Payer: Self-pay | Admitting: Plastic Surgery

## 2021-06-11 DIAGNOSIS — Z719 Counseling, unspecified: Secondary | ICD-10-CM

## 2021-06-11 NOTE — Progress Notes (Signed)
The patient is a 70 year old female here for follow-up after undergoing the abdominoplasty.  She looks really good.  She has bruising and swelling as expected.  No sign of a seroma or hematoma.  Her bowels are moving and her pain is controlled.  The dressings are in place.  No areas of concern.  She can shower and go into a spanks.  I like to see her back in 1 week.

## 2021-06-12 ENCOUNTER — Ambulatory Visit: Payer: Medicare Other | Admitting: Family Medicine

## 2021-06-12 VITALS — BP 116/62 | HR 80 | Ht 63.0 in | Wt 148.0 lb

## 2021-06-12 DIAGNOSIS — G43009 Migraine without aura, not intractable, without status migrainosus: Secondary | ICD-10-CM | POA: Diagnosis not present

## 2021-06-12 DIAGNOSIS — G43709 Chronic migraine without aura, not intractable, without status migrainosus: Secondary | ICD-10-CM

## 2021-06-12 NOTE — Progress Notes (Signed)
06/12/2021 ALL: She returns for Botox procedure. She had difficulty with an intractable migraines that lasted for over a week. It was finally aborted with migraine cocktail and steroid taper. She usually has about 1 migraine per month that is easily aborted with Nurtec. She is feeling well, today.   02/27/2021 ALL: She continues to do well on Botox. Greater than 50% reduction in migraines. Emgality and rizatriptan working well.   11/29/2020 ALL: She continues to do well. She continues Botox and Emgality. Rizatriptan works well for abortive therapy. Average 1 migraine per month.   Consent Form Botulism Toxin Injection For Chronic Migraine    Reviewed orally with patient, additionally signature is on file:  Botulism toxin has been approved by the Federal drug administration for treatment of chronic migraine. Botulism toxin does not cure chronic migraine and it may not be effective in some patients.  The administration of botulism toxin is accomplished by injecting a small amount of toxin into the muscles of the neck and head. Dosage must be titrated for each individual. Any benefits resulting from botulism toxin tend to wear off after 3 months with a repeat injection required if benefit is to be maintained. Injections are usually done every 3-4 months with maximum effect peak achieved by about 2 or 3 weeks. Botulism toxin is expensive and you should be sure of what costs you will incur resulting from the injection.  The side effects of botulism toxin use for chronic migraine may include:   -Transient, and usually mild, facial weakness with facial injections  -Transient, and usually mild, head or neck weakness with head/neck injections  -Reduction or loss of forehead facial animation due to forehead muscle weakness  -Eyelid drooping  -Dry eye  -Pain at the site of injection or bruising at the site of injection  -Double vision  -Potential unknown long term risks   Contraindications: You  should not have Botox if you are pregnant, nursing, allergic to albumin, have an infection, skin condition, or muscle weakness at the site of the injection, or have myasthenia gravis, Lambert-Eaton syndrome, or ALS.  It is also possible that as with any injection, there may be an allergic reaction or no effect from the medication. Reduced effectiveness after repeated injections is sometimes seen and rarely infection at the injection site may occur. All care will be taken to prevent these side effects. If therapy is given over a long time, atrophy and wasting in the muscle injected may occur. Occasionally the patient's become refractory to treatment because they develop antibodies to the toxin. In this event, therapy needs to be modified.  I have read the above information and consent to the administration of botulism toxin.    BOTOX PROCEDURE NOTE FOR MIGRAINE HEADACHE  Contraindications and precautions discussed with patient(above). Aseptic procedure was observed and patient tolerated procedure. Procedure performed by Debbora Presto, FNP-C.   The condition has existed for more than 6 months, and pt does not have a diagnosis of ALS, Myasthenia Gravis or Lambert-Eaton Syndrome.  Risks and benefits of injections discussed and pt agrees to proceed with the procedure.  Written consent obtained  These injections are medically necessary. Pt  receives good benefits from these injections. These injections do not cause sedations or hallucinations which the oral therapies may cause.   Description of procedure:  The patient was placed in a sitting position. The standard protocol was used for Botox as follows, with 5 units of Botox injected at each site:  -Procerus muscle, midline  injection  -Corrugator muscle, bilateral injection  -Frontalis muscle, bilateral injection, with 2 sites each side, medial injection was performed in the upper one third of the frontalis muscle, in the region vertical from the  medial inferior edge of the superior orbital rim. The lateral injection was again in the upper one third of the forehead vertically above the lateral limbus of the cornea, 1.5 cm lateral to the medial injection site.  -Temporalis muscle injection, 4 sites, bilaterally. The first injection was 3 cm above the tragus of the ear, second injection site was 1.5 cm to 3 cm up from the first injection site in line with the tragus of the ear. The third injection site was 1.5-3 cm forward between the first 2 injection sites. The fourth injection site was 1.5 cm posterior to the second injection site. 5th site laterally in the temporalis  muscleat the level of the outer canthus.  -Occipitalis muscle injection, 3 sites, bilaterally. The first injection was done one half way between the occipital protuberance and the tip of the mastoid process behind the ear. The second injection site was done lateral and superior to the first, 1 fingerbreadth from the first injection. The third injection site was 1 fingerbreadth superiorly and medially from the first injection site.  -Cervical paraspinal muscle injection, 2 sites, bilaterally. The first injection site was 1 cm from the midline of the cervical spine, 3 cm inferior to the lower border of the occipital protuberance. The second injection site was 1.5 cm superiorly and laterally to the first injection site.  -Trapezius muscle injection was performed at 3 sites, bilaterally. The first injection site was in the upper trapezius muscle halfway between the inflection point of the neck, and the acromion. The second injection site was one half way between the acromion and the first injection site. The third injection was done between the first injection site and the inflection point of the neck.   Will return for repeat injection in 3 months.   A total of 200 units of Botox was prepared, 155 units of Botox was injected as documented above, any Botox not injected was wasted.  The patient tolerated the procedure well, there were no complications of the above procedure.

## 2021-06-12 NOTE — Progress Notes (Signed)
Botox- 100 units x 2 vials Lot: [id number] Expiration: 04/2023 NDC: 2897-9150-41  Bacteriostatic 0.9% Sodium Chloride- 22mL total Lot: [id number] Expiration: 11/24/2022 NDC: 7793-9688-64  Dx: G47.207 B/B

## 2021-06-17 DIAGNOSIS — Z0000 Encounter for general adult medical examination without abnormal findings: Secondary | ICD-10-CM | POA: Diagnosis not present

## 2021-06-18 DIAGNOSIS — Z7901 Long term (current) use of anticoagulants: Secondary | ICD-10-CM | POA: Diagnosis not present

## 2021-06-18 DIAGNOSIS — R42 Dizziness and giddiness: Secondary | ICD-10-CM | POA: Diagnosis not present

## 2021-06-18 DIAGNOSIS — I10 Essential (primary) hypertension: Secondary | ICD-10-CM | POA: Diagnosis not present

## 2021-06-18 DIAGNOSIS — M810 Age-related osteoporosis without current pathological fracture: Secondary | ICD-10-CM | POA: Diagnosis not present

## 2021-06-18 DIAGNOSIS — I480 Paroxysmal atrial fibrillation: Secondary | ICD-10-CM | POA: Diagnosis not present

## 2021-06-18 DIAGNOSIS — E7800 Pure hypercholesterolemia, unspecified: Secondary | ICD-10-CM | POA: Diagnosis not present

## 2021-06-18 DIAGNOSIS — G43909 Migraine, unspecified, not intractable, without status migrainosus: Secondary | ICD-10-CM | POA: Diagnosis not present

## 2021-06-22 LAB — CUP PACEART REMOTE DEVICE CHECK
Date Time Interrogation Session: 20220729095750
Implantable Pulse Generator Implant Date: 20210902

## 2021-06-24 ENCOUNTER — Telehealth: Payer: Self-pay

## 2021-06-24 ENCOUNTER — Ambulatory Visit (INDEPENDENT_AMBULATORY_CARE_PROVIDER_SITE_OTHER): Payer: Medicare Other

## 2021-06-24 DIAGNOSIS — I4819 Other persistent atrial fibrillation: Secondary | ICD-10-CM

## 2021-06-24 NOTE — Telephone Encounter (Signed)
Returned patients call. Indicated left lower quadrant of her abdomen was hurting when she got up and walked around yesterday. Today, she started having left leg aches. She is drinking plenty of fluids, staying hydrated, and having regular bowel movements. Denies any high blood pressure, nor ankle swelling,fever, chills, nor nausea. Patient took hydrocodone for the abdomen pain and appeared to help.  Bruises on her abdomen are almost gone and have healed. However, today she started having diarrhea. She has Imodium, but has not taken it yet. Advised her that she can wait to see Dr. Marla Roe at her appointment tomorrow at 10:00. However, if she starts to feel real bad over night, or symptoms worsen, she can call our office after hours and the on-call physician can assist with any questions. Also mentioned that she could go to the ED as well. Cendy also asked if she could stop wearing the foam and only wear the spanks. Surgery was on 06/03/2021 for mini-tummy tuck and liposuction with Dr. Marla Roe. Advise she can wear just the spanks. Patient understood and agreed with plan.

## 2021-06-24 NOTE — Telephone Encounter (Signed)
Patient called to say she has an appointment with [place] tomorrow for a 3-week follow-up from her mini-tummy tuck.  Patient said that yesterday she started having some left lower abdominal pain when she walks.  Patient said that she is still having it and her left leg kind of aches.  She would like to know if she should wait until tomorrow to see [place] or if she should go somewhere else.  Please call.

## 2021-06-25 ENCOUNTER — Other Ambulatory Visit: Payer: Self-pay

## 2021-06-25 ENCOUNTER — Encounter: Payer: Medicare Other | Admitting: Plastic Surgery

## 2021-06-25 ENCOUNTER — Emergency Department (HOSPITAL_COMMUNITY): Payer: Medicare Other

## 2021-06-25 ENCOUNTER — Inpatient Hospital Stay (HOSPITAL_COMMUNITY)
Admission: EM | Admit: 2021-06-25 | Discharge: 2021-06-28 | DRG: 270 | Disposition: A | Discharge status: Home / Self Care | Payer: Medicare Other | Attending: Internal Medicine | Admitting: Internal Medicine

## 2021-06-25 ENCOUNTER — Encounter (HOSPITAL_COMMUNITY): Payer: Self-pay

## 2021-06-25 ENCOUNTER — Other Ambulatory Visit (HOSPITAL_COMMUNITY): Payer: Medicare Other

## 2021-06-25 ENCOUNTER — Inpatient Hospital Stay (HOSPITAL_COMMUNITY): Payer: Medicare Other

## 2021-06-25 DIAGNOSIS — I82402 Acute embolism and thrombosis of unspecified deep veins of left lower extremity: Secondary | ICD-10-CM

## 2021-06-25 DIAGNOSIS — I82412 Acute embolism and thrombosis of left femoral vein: Principal | ICD-10-CM | POA: Diagnosis present

## 2021-06-25 DIAGNOSIS — M79605 Pain in left leg: Secondary | ICD-10-CM | POA: Diagnosis not present

## 2021-06-25 DIAGNOSIS — Z96641 Presence of right artificial hip joint: Secondary | ICD-10-CM | POA: Diagnosis not present

## 2021-06-25 DIAGNOSIS — R9431 Abnormal electrocardiogram [ECG] [EKG]: Secondary | ICD-10-CM | POA: Diagnosis not present

## 2021-06-25 DIAGNOSIS — Z7983 Long term (current) use of bisphosphonates: Secondary | ICD-10-CM | POA: Diagnosis not present

## 2021-06-25 DIAGNOSIS — I82432 Acute embolism and thrombosis of left popliteal vein: Secondary | ICD-10-CM | POA: Diagnosis not present

## 2021-06-25 DIAGNOSIS — I824Y2 Acute embolism and thrombosis of unspecified deep veins of left proximal lower extremity: Secondary | ICD-10-CM | POA: Diagnosis not present

## 2021-06-25 DIAGNOSIS — I82409 Acute embolism and thrombosis of unspecified deep veins of unspecified lower extremity: Secondary | ICD-10-CM

## 2021-06-25 DIAGNOSIS — I10 Essential (primary) hypertension: Secondary | ICD-10-CM | POA: Diagnosis present

## 2021-06-25 DIAGNOSIS — I2609 Other pulmonary embolism with acute cor pulmonale: Secondary | ICD-10-CM | POA: Diagnosis not present

## 2021-06-25 DIAGNOSIS — I82452 Acute embolism and thrombosis of left peroneal vein: Secondary | ICD-10-CM | POA: Diagnosis present

## 2021-06-25 DIAGNOSIS — I471 Supraventricular tachycardia: Secondary | ICD-10-CM | POA: Diagnosis not present

## 2021-06-25 DIAGNOSIS — Z79899 Other long term (current) drug therapy: Secondary | ICD-10-CM

## 2021-06-25 DIAGNOSIS — I480 Paroxysmal atrial fibrillation: Secondary | ICD-10-CM | POA: Diagnosis present

## 2021-06-25 DIAGNOSIS — I2694 Multiple subsegmental pulmonary emboli without acute cor pulmonale: Secondary | ICD-10-CM | POA: Diagnosis not present

## 2021-06-25 DIAGNOSIS — I2699 Other pulmonary embolism without acute cor pulmonale: Secondary | ICD-10-CM | POA: Diagnosis not present

## 2021-06-25 DIAGNOSIS — D62 Acute posthemorrhagic anemia: Secondary | ICD-10-CM | POA: Diagnosis not present

## 2021-06-25 DIAGNOSIS — I82492 Acute embolism and thrombosis of other specified deep vein of left lower extremity: Secondary | ICD-10-CM | POA: Diagnosis not present

## 2021-06-25 DIAGNOSIS — Z7901 Long term (current) use of anticoagulants: Secondary | ICD-10-CM | POA: Diagnosis not present

## 2021-06-25 DIAGNOSIS — G43909 Migraine, unspecified, not intractable, without status migrainosus: Secondary | ICD-10-CM | POA: Diagnosis not present

## 2021-06-25 DIAGNOSIS — Z20822 Contact with and (suspected) exposure to covid-19: Secondary | ICD-10-CM | POA: Diagnosis present

## 2021-06-25 DIAGNOSIS — I871 Compression of vein: Secondary | ICD-10-CM | POA: Diagnosis present

## 2021-06-25 DIAGNOSIS — I82422 Acute embolism and thrombosis of left iliac vein: Secondary | ICD-10-CM | POA: Diagnosis present

## 2021-06-25 DIAGNOSIS — J9811 Atelectasis: Secondary | ICD-10-CM | POA: Diagnosis not present

## 2021-06-25 DIAGNOSIS — Z79890 Hormone replacement therapy (postmenopausal): Secondary | ICD-10-CM

## 2021-06-25 HISTORY — DX: Other pulmonary embolism without acute cor pulmonale: I26.99

## 2021-06-25 HISTORY — DX: Acute embolism and thrombosis of unspecified deep veins of unspecified lower extremity: I82.409

## 2021-06-25 LAB — BASIC METABOLIC PANEL
Anion gap: 10 (ref 5–15)
BUN: 21 mg/dL (ref 8–23)
CO2: 26 mmol/L (ref 22–32)
Calcium: 9.2 mg/dL (ref 8.9–10.3)
Chloride: 103 mmol/L (ref 98–111)
Creatinine, Ser: 0.75 mg/dL (ref 0.44–1.00)
GFR, Estimated: >60 mL/min (ref >60)
Glucose, Bld: 124 mg/dL — ABNORMAL HIGH (ref 70–99)
Potassium: 4.3 mmol/L (ref 3.5–5.1)
Sodium: 139 mmol/L (ref 135–145)

## 2021-06-25 LAB — CBC
HCT: 35.1 % — ABNORMAL LOW (ref 36.0–46.0)
Hemoglobin: 11.4 g/dL — ABNORMAL LOW (ref 12.0–15.0)
MCH: 30.6 pg (ref 26.0–34.0)
MCHC: 32.5 g/dL (ref 30.0–36.0)
MCV: 94.4 fL (ref 80.0–100.0)
Platelets: 163 10*3/uL (ref 150–400)
RBC: 3.72 MIL/uL — ABNORMAL LOW (ref 3.87–5.11)
RDW: 14.5 % (ref 11.5–15.5)
WBC: 11.2 10*3/uL — ABNORMAL HIGH (ref 4.0–10.5)
nRBC: 0 % (ref 0.0–0.2)

## 2021-06-25 LAB — HEPARIN LEVEL (UNFRACTIONATED): Heparin Unfractionated: 0.66 IU/mL (ref 0.30–0.70)

## 2021-06-25 LAB — RESP PANEL BY RT-PCR (FLU A&B, COVID) ARPGX2
Influenza A by PCR: NEGATIVE
Influenza B by PCR: NEGATIVE
SARS Coronavirus 2 by RT PCR: NEGATIVE

## 2021-06-25 LAB — PROTIME-INR
INR: 1.1 (ref 0.8–1.2)
Prothrombin Time: 14.2 seconds (ref 11.4–15.2)

## 2021-06-25 LAB — TROPONIN I (HIGH SENSITIVITY)
Troponin I (High Sensitivity): 3 ng/L (ref <18)
Troponin I (High Sensitivity): 6 ng/L (ref <18)

## 2021-06-25 MED ORDER — IOHEXOL 350 MG/ML SOLN
100.0000 mL | Freq: Once | INTRAVENOUS | Status: AC | PRN
  Administered 2021-06-25: 75 mL via INTRAVENOUS

## 2021-06-25 MED ORDER — NARATRIPTAN HCL 2.5 MG PO TABS: 2.5000 mg | ORAL_TABLET | ORAL | Status: DC | PRN

## 2021-06-25 MED ORDER — CALCIUM CARBONATE-VITAMIN D 500-200 MG-UNIT PO TABS
1.0000 | ORAL_TABLET | Freq: Every day | ORAL | Status: DC
  Filled 2021-06-25 (×2): qty 1

## 2021-06-25 MED ORDER — IOHEXOL 350 MG/ML SOLN
100.0000 mL | Freq: Once | INTRAVENOUS | Status: AC | PRN
  Administered 2021-06-25: 100 mL via INTRAVENOUS

## 2021-06-25 MED ORDER — MECLIZINE HCL 12.5 MG PO TABS
12.5000 mg | ORAL_TABLET | Freq: Three times a day (TID) | ORAL | Status: DC | PRN
  Filled 2021-06-25: qty 1

## 2021-06-25 MED ORDER — POLYETHYLENE GLYCOL 3350 17 G PO PACK: 17.0000 g | PACK | Freq: Every day | ORAL | Status: DC | PRN

## 2021-06-25 MED ORDER — BISACODYL 5 MG PO TBEC: 5.0000 mg | DELAYED_RELEASE_TABLET | Freq: Every day | ORAL | Status: DC | PRN

## 2021-06-25 MED ORDER — PROMETHAZINE HCL 25 MG PO TABS: 25.0000 mg | ORAL_TABLET | Freq: Three times a day (TID) | ORAL | Status: DC | PRN

## 2021-06-25 MED ORDER — HYDROCODONE-ACETAMINOPHEN 5-325 MG PO TABS
1.0000 | ORAL_TABLET | Freq: Four times a day (QID) | ORAL | Status: DC | PRN
  Administered 2021-06-26: 1 via ORAL
  Filled 2021-06-25: qty 1

## 2021-06-25 MED ORDER — DIPHENHYDRAMINE HCL 50 MG/ML IJ SOLN
25.0000 mg | Freq: Four times a day (QID) | INTRAMUSCULAR | Status: DC | PRN
  Administered 2021-06-25: 25 mg via INTRAVENOUS
  Filled 2021-06-25: qty 1

## 2021-06-25 MED ORDER — VITAMIN D 25 MCG (1000 UNIT) PO TABS
2000.0000 [IU] | ORAL_TABLET | Freq: Every day | ORAL | Status: DC
  Filled 2021-06-25 (×3): qty 2

## 2021-06-25 MED ORDER — ACETAMINOPHEN 500 MG PO TABS: 1000.0000 mg | ORAL_TABLET | Freq: Three times a day (TID) | ORAL | Status: DC | PRN

## 2021-06-25 MED ORDER — METOCLOPRAMIDE HCL 5 MG/ML IJ SOLN: 10.0000 mg | Freq: Four times a day (QID) | INTRAMUSCULAR | Status: DC | PRN

## 2021-06-25 MED ORDER — LOSARTAN POTASSIUM 25 MG PO TABS
25.0000 mg | ORAL_TABLET | Freq: Every day | ORAL | Status: DC
  Filled 2021-06-25: qty 1

## 2021-06-25 MED ORDER — DIPHENHYDRAMINE HCL 25 MG PO CAPS: 25.0000 mg | ORAL_CAPSULE | ORAL | Status: DC | PRN

## 2021-06-25 MED ORDER — HEPARIN (PORCINE) 25000 UT/250ML-% IV SOLN
1000.0000 [IU]/h | INTRAVENOUS | Status: AC
  Administered 2021-06-25: 1000 [IU]/h via INTRAVENOUS
  Filled 2021-06-25 (×3): qty 250

## 2021-06-25 MED ORDER — CALCIUM CARB-CHOLECALCIFEROL 600-800 MG-UNIT PO TABS: 1.0000 | ORAL_TABLET | Freq: Every day | ORAL | Status: DC

## 2021-06-25 MED ORDER — SUMATRIPTAN SUCCINATE 25 MG PO TABS
25.0000 mg | ORAL_TABLET | ORAL | Status: DC | PRN
  Filled 2021-06-25: qty 1

## 2021-06-25 MED ORDER — HEPARIN BOLUS VIA INFUSION
1700.0000 [IU] | Freq: Once | INTRAVENOUS | Status: AC
  Administered 2021-06-25: 1700 [IU] via INTRAVENOUS
  Filled 2021-06-25: qty 1700

## 2021-06-25 MED ORDER — HYDROMORPHONE HCL 1 MG/ML IJ SOLN: 0.5000 mg | INTRAMUSCULAR | Status: DC | PRN

## 2021-06-25 MED ORDER — ONDANSETRON HCL 4 MG PO TABS: 4.0000 mg | ORAL_TABLET | Freq: Three times a day (TID) | ORAL | Status: DC | PRN

## 2021-06-25 MED ORDER — NON FORMULARY: 1.0000 | Status: DC | PRN

## 2021-06-25 NOTE — ED Notes (Signed)
Pt to CT

## 2021-06-25 NOTE — Progress Notes (Signed)
New admission from [facility] into [facility] due to continuation of care. She was brought in by EMS with heparin gtt infusing at 69m/hr. VSS, RA.

## 2021-06-25 NOTE — Progress Notes (Signed)
ANTICOAGULATION CONSULT NOTE  Pharmacy Consult for Heparin Indication: DVT  Allergies  Allergen Reactions   Oxycontin [Oxycodone Hcl] Nausea Only   Topamax [Topiramate]     Intermittent blurry vision, numbness/tingling, gi upset   Metoprolol Nausea Only   Nortriptyline Palpitations   Propranolol Nausea Only    Patient Measurements: Height: '5\' 3"'$  (160 cm) Weight: 66.4 kg (146 lb 6.2 oz) IBW/kg (Calculated) : 52.4 Heparin Dosing Weight:  Total weight 67.1 kg Ideal body weight: 52.4 kg (115 lb 8.3 oz) Adjusted ideal body weight: 58 kg (127 lb 13.9 oz)   Vital Signs: Temp: 98.8 F (37.1 C) (08/02 1616) Temp Source: Oral (08/02 1616) BP: 122/54 (08/02 1616) Pulse Rate: 102 (08/02 1616)  Labs: Recent Labs    06/25/21 0805 06/25/21 1320 06/25/21 1617 06/25/21 1821  HGB 11.4*  --   --   --   HCT 35.1*  --   --   --   PLT 163  --   --   --   LABPROT 14.2  --   --   --   INR 1.1  --   --   --   HEPARINUNFRC  --   --   --  0.66  CREATININE 0.75  --   --   --   TROPONINIHS  --  3 6  --      Estimated Creatinine Clearance: 58.2 mL/min (by C-G formula based on SCr of 0.75 mg/dL).  Assessment: 56 yoF presented to ED on 8/2 with new DVT.  She is s/p abdominoplasty about 3 weeks ago.  Pharmacy is consulted to dose Heparin IV.  Initial heparin level therapeutic   Goal of Therapy:  Heparin level 0.3-0.7 units/ml Monitor platelets by anticoagulation protocol: Yes   Plan:  Continue heparin at 1000 units / hr Daily heparin level and CBC  Thank you Anette Guarneri, PharmD 06/25/2021 7:01 PM

## 2021-06-25 NOTE — ED Triage Notes (Addendum)
Pt had a mini tummy tuck 3 weeks ago and is now having left side groin pain. Pt also reports left calf pain.
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
## 2021-06-25 NOTE — Progress Notes (Signed)
LLE venous duplex has been completed.  Preliminary findings given to Dr. Tomi Bamberger.  Results can be found under chart review under CV PROC. 06/25/2021 9:01 AM Teyah Rossy RVT, RDMS
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
## 2021-06-25 NOTE — ED Notes (Signed)
Care [facility] for Transport.   Report given to Remo Lipps, RN on 6N.
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
## 2021-06-25 NOTE — ED Notes (Signed)
Attempted to call report to 6N. RN not avaiable at this time.

## 2021-06-25 NOTE — ED Notes (Signed)
Pt placed on purewick 

## 2021-06-25 NOTE — Progress Notes (Signed)
ANTICOAGULATION CONSULT NOTE - Initial Consult  Pharmacy Consult for Heparin Indication: DVT  Allergies  Allergen Reactions   Oxycontin [Oxycodone Hcl] Nausea Only   Topamax [Topiramate]     Intermittent blurry vision, numbness/tingling, gi upset   Metoprolol Nausea Only   Nortriptyline Palpitations   Propranolol Nausea Only    Patient Measurements:   Heparin Dosing Weight:  Total weight 67.1 kg Ideal body weight: 52.4 kg (115 lb 8.3 oz) Adjusted ideal body weight: 58.3 kg (128 lb 8.2 oz)   Vital Signs: Temp: 98.1 F (36.7 C) (08/02 0634) BP: 106/40 (08/02 0830) Pulse Rate: 90 (08/02 0830)  Labs: Recent Labs    06/25/21 0805  HGB 11.4*  HCT 35.1*  PLT 163  LABPROT 14.2  INR 1.1  CREATININE 0.75    Estimated Creatinine Clearance: 58.5 mL/min (by C-G formula based on SCr of 0.75 mg/dL).   Medical History: Past Medical History:  Diagnosis Date   Appendicitis, acute 06/01/2012   Basal cell carcinoma    "right shoulder" (02/11/2018)   Current use of long term anticoagulation 07/07/2017   History of blood transfusion    "related to hip replacement" (02/11/2018)   Migraine    "5-6/year maybe" (02/11/2018)   Palpitations 08/30/2014   Seen by Dr. Tollie Eth, had a holter 08/2014    Paroxysmal atrial fibrillation ([place]) 09/04/2014   Dr. Wynonia Lawman. Started eliquis 08/2014     Medications:  Scheduled:  Infusions:   Assessment: 77 yoF presented to ED on 8/2 with new DVT.  She is s/p abdominoplasty about 3 weeks ago.  Pharmacy is consulted to dose Heparin IV. No prior anticoagulation noted. Baseline CBC:  Hgb 11.4, Plt WNL Baseline INR 1.1  Goal of Therapy:  Heparin level 0.3-0.7 units/ml Monitor platelets by anticoagulation protocol: Yes   Plan:  Give heparin 1700 units bolus IV x 1 Start heparin IV infusion at 1000 units/hr ([place] dosing) Heparin level 8 hours after starting Daily heparin level and CBC   Gretta Arab PharmD, BCPS Clinical  Pharmacist [facility] main pharmacy [phone] 06/25/2021 9:42 AM

## 2021-06-25 NOTE — ED Notes (Signed)
Admitting MD at bedside.

## 2021-06-26 ENCOUNTER — Other Ambulatory Visit (HOSPITAL_COMMUNITY): Payer: Medicare Other

## 2021-06-26 ENCOUNTER — Inpatient Hospital Stay (HOSPITAL_COMMUNITY): Payer: Medicare Other

## 2021-06-26 HISTORY — PX: IR US GUIDE VASC ACCESS RIGHT: IMG2390

## 2021-06-26 HISTORY — PX: IR INTRAVASCULAR ULTRASOUND NON CORONARY: IMG6085

## 2021-06-26 HISTORY — PX: IR THROMBECT VENO MECH MOD SED: IMG2300

## 2021-06-26 HISTORY — PX: IR TRANSCATH PLC STENT 1ST ART NOT LE CV CAR VERT CAR: IMG5443

## 2021-06-26 HISTORY — PX: IR VENO/EXT/UNI LEFT: IMG675

## 2021-06-26 HISTORY — PX: IR US GUIDE VASC ACCESS LEFT: IMG2389

## 2021-06-26 LAB — BASIC METABOLIC PANEL
Anion gap: 11 (ref 5–15)
BUN: 15 mg/dL (ref 8–23)
CO2: 24 mmol/L (ref 22–32)
Calcium: 8.7 mg/dL — ABNORMAL LOW (ref 8.9–10.3)
Chloride: 104 mmol/L (ref 98–111)
Creatinine, Ser: 0.74 mg/dL (ref 0.44–1.00)
GFR, Estimated: >60 mL/min (ref >60)
Glucose, Bld: 116 mg/dL — ABNORMAL HIGH (ref 70–99)
Potassium: 3.6 mmol/L (ref 3.5–5.1)
Sodium: 139 mmol/L (ref 135–145)

## 2021-06-26 LAB — CBC
HCT: 33.3 % — ABNORMAL LOW (ref 36.0–46.0)
Hemoglobin: 10.7 g/dL — ABNORMAL LOW (ref 12.0–15.0)
MCH: 30.1 pg (ref 26.0–34.0)
MCHC: 32.1 g/dL (ref 30.0–36.0)
MCV: 93.8 fL (ref 80.0–100.0)
Platelets: 176 10*3/uL (ref 150–400)
RBC: 3.55 MIL/uL — ABNORMAL LOW (ref 3.87–5.11)
RDW: 14.4 % (ref 11.5–15.5)
WBC: 10.7 10*3/uL — ABNORMAL HIGH (ref 4.0–10.5)
nRBC: 0 % (ref 0.0–0.2)

## 2021-06-26 LAB — POCT ACTIVATED CLOTTING TIME
Activated Clotting Time: 155 seconds
Activated Clotting Time: 208 seconds
Activated Clotting Time: 248 seconds
Activated Clotting Time: 248 seconds

## 2021-06-26 MED ORDER — MIDAZOLAM HCL 2 MG/2ML IJ SOLN
INTRAMUSCULAR | Status: AC | PRN
  Administered 2021-06-26 (×2): 0.5 mg via INTRAVENOUS
  Administered 2021-06-26: 1 mg via INTRAVENOUS
  Administered 2021-06-26 (×2): 0.5 mg via INTRAVENOUS

## 2021-06-26 MED ORDER — LIDOCAINE HCL 1 % IJ SOLN
INTRAMUSCULAR | Status: AC
  Filled 2021-06-26: qty 20

## 2021-06-26 MED ORDER — FENTANYL CITRATE (PF) 100 MCG/2ML IJ SOLN
INTRAMUSCULAR | Status: AC
  Filled 2021-06-26: qty 2

## 2021-06-26 MED ORDER — MIDAZOLAM HCL 2 MG/2ML IJ SOLN
INTRAMUSCULAR | Status: AC
  Filled 2021-06-26: qty 2

## 2021-06-26 MED ORDER — IODIXANOL 320 MG/ML IV SOLN: 100.0000 mL | Freq: Once | INTRAVENOUS | Status: DC | PRN

## 2021-06-26 MED ORDER — ENOXAPARIN SODIUM 80 MG/0.8ML IJ SOSY
1.0000 mg/kg | PREFILLED_SYRINGE | Freq: Two times a day (BID) | INTRAMUSCULAR | Status: DC
  Filled 2021-06-26: qty 0.68

## 2021-06-26 MED ORDER — LIDOCAINE HCL (PF) 1 % IJ SOLN
INTRAMUSCULAR | Status: AC | PRN
Start: 2021-06-26 — End: 2021-06-26
  Administered 2021-06-26: 10 mL via SUBCUTANEOUS
  Administered 2021-06-26: 3 mL via SUBCUTANEOUS

## 2021-06-26 MED ORDER — HEPARIN SODIUM (PORCINE) 1000 UNIT/ML IJ SOLN
INTRAMUSCULAR | Status: AC
  Filled 2021-06-26: qty 1

## 2021-06-26 MED ORDER — FENTANYL CITRATE (PF) 100 MCG/2ML IJ SOLN
INTRAMUSCULAR | Status: AC | PRN
  Administered 2021-06-26: 50 ug via INTRAVENOUS
  Administered 2021-06-26: 12.5 ug via INTRAVENOUS
  Administered 2021-06-26 (×3): 25 ug via INTRAVENOUS
  Administered 2021-06-26: 12.5 ug via INTRAVENOUS

## 2021-06-26 MED ORDER — HEPARIN SODIUM (PORCINE) 1000 UNIT/ML IJ SOLN
INTRAMUSCULAR | Status: AC | PRN
  Administered 2021-06-26: 6000 [IU] via INTRAVENOUS
  Administered 2021-06-26: 3000 [IU] via INTRAVENOUS

## 2021-06-26 MED ORDER — ENOXAPARIN SODIUM 80 MG/0.8ML IJ SOSY
70.0000 mg | PREFILLED_SYRINGE | Freq: Two times a day (BID) | INTRAMUSCULAR | Status: DC
  Administered 2021-06-26 – 2021-06-28 (×5): 70 mg via SUBCUTANEOUS
  Filled 2021-06-26 (×4): qty 0.8

## 2021-06-26 NOTE — Progress Notes (Signed)
ANTICOAGULATION CONSULT NOTE  Pharmacy Consult for Heparin>>Lovenox Indication: DVT  Allergies  Allergen Reactions   Oxycontin [Oxycodone Hcl] Nausea Only   Topamax [Topiramate]     Intermittent blurry vision, numbness/tingling, gi upset   Metoprolol Nausea Only   Nortriptyline Palpitations   Propranolol Nausea Only    Patient Measurements: Height: '5\' 3"'$  (160 cm) Weight: 66.4 kg (146 lb 6.2 oz) IBW/kg (Calculated) : 52.4 Heparin Dosing Weight:  Total weight 67.1 kg Ideal body weight: 52.4 kg (115 lb 8.3 oz) Adjusted ideal body weight: 58 kg (127 lb 13.9 oz)   Vital Signs: Temp: 98.6 F (37 C) (08/03 0417) Temp Source: Oral (08/03 0417) BP: 118/44 (08/03 0417) Pulse Rate: 96 (08/03 0417)  Labs: Recent Labs    06/25/21 0805 06/25/21 1320 06/25/21 1617 06/25/21 1821 06/26/21 0113  HGB 11.4*  --   --   --  10.7*  HCT 35.1*  --   --   --  33.3*  PLT 163  --   --   --  176  LABPROT 14.2  --   --   --   --   INR 1.1  --   --   --   --   HEPARINUNFRC  --   --   --  0.66  --   CREATININE 0.75  --   --   --  0.74  TROPONINIHS  --  3 6  --   --      Estimated Creatinine Clearance: 58.2 mL/min (by C-G formula based on SCr of 0.74 mg/dL).  Assessment: 46 yoF presented to ED on 8/2 with new DVT.  She is s/p abdominoplasty about 3 weeks ago.  Pharmacy is consulted to dose Heparin IV.  Discussed with IR MD, patient to have thrombectomy in IR this AM. Patient to remain on heparin through procedure and then transition to lovenox '1mg'$ /kg q12h thereafter per IR.   Goal of Therapy:  Monitor platelets by anticoagulation protocol: Yes   Plan:  D/C heparin at end of IR procedure Lovenox 67.'5mg'$ h SQ q12h thereafter   Emanuel Campos A. Levada Dy, PharmD, BCPS, FNKF Clinical Pharmacist  Please utilize Amion for appropriate phone number to reach the unit pharmacist ([place])  06/26/2021 8:05 AM

## 2021-06-26 NOTE — Progress Notes (Addendum)
PROGRESS NOTE    Hetty Voeller  [id number] DOB: November 14, 1951 DOA: 06/25/2021 PCP: Kathyrn Lass, MD  Brief Narrative: 70 year old female with history of migraines, paroxysmal atrial fibrillation status post ablations x2, no longer on anticoagulation, hypertension, underwent a mini tummy tuck/liposuction 3 weeks ago, presented to the ED with new onset left groin pain and swelling in her left leg. -Upon work-up was noted to have extensive left leg DVT with significant clot burden, CTA positive for bilateral subsegmental PE   Assessment & Plan:   Acute extensive left leg DVT May Thurner syndrome -Treated with IV heparin on admission, interventional radiology was consulted by EDP yesterday -Underwent suction thrombectomy, balloon valvuloplasty of left common femoral vein and stent placement and left common iliac vein this morning -Switched over to subcu Lovenox now -IR recommended to continue twice daily Lovenox for at least a month, after this can probably base transitioned over to DOAC  History of migraines -Continue home regimen, stable now  History of PAF -In sinus rhythm at this time  Recent tummy tuck/liposuction -Site appears unremarkable, follow-up with Dr. Marla Roe   DVT prophylaxis: Lovenox Code Status: Full code Family Communication: Spouse at bedside Disposition Plan:  Status is: Inpatient  Remains inpatient appropriate because:Inpatient level of care appropriate due to severity of illness  Dispo: The patient is from: Home              Anticipated d/c is to: Home              Patient currently is not medically stable to d/c.   Difficult to place patient No        Consultants:  Interventional radiology  Procedures: Interventional Radiology Procedure Note   Procedure: Dr. Demetrios Loll 8/3 1) Right greater saphenous vein and left common femoral vein access 2) Suction thrombectomy from the left common iliac to anterior tibial vein 3) Balloon venoplasty of  left common femoral vein 4) Intravascular ultrasound 5) Stent placement in left common iliac vein    Antimicrobials:    Subjective: -Feels okay overall, mild abdominal discomfort, no nausea or vomiting, denies any chest pain  Objective: Vitals:   06/26/21 1200 06/26/21 1200 06/26/21 1215 06/26/21 1300  BP: (!) 93/49 (!) 93/49 (!) 108/46 (!) 115/49  Pulse: 92 90 79 100  Resp: '16 15 16 16  '$ Temp:    98.4 F (36.9 C)  TempSrc:    Oral  SpO2: 100% 100% 100% 97%  Weight:      Height:        Intake/Output Summary (Last 24 hours) at 06/26/2021 1512 Last data filed at 06/26/2021 0302 Gross per 24 hour  Intake 180.24 ml  Output --  Net 180.24 ml   Filed Weights   06/25/21 1616  Weight: 66.4 kg    Examination:  General exam: Pleasant female laying in bed, AAOx3, no distress HEENT: No JVD CVS: S1-S2, regular rate rhythm Lungs: Decreased breath sounds to bases Abdomen: Soft, nontender, lower abdominal dressing noted, bowel sounds present Extremities: Left leg swelling, groin with dressing Skin: No rashes Psychiatry: Judgement and insight appear normal. Mood & affect appropriate.     Data Reviewed:   CBC: Recent Labs  Lab 06/25/21 0805 06/26/21 0113  WBC 11.2* 10.7*  HGB 11.4* 10.7*  HCT 35.1* 33.3*  MCV 94.4 93.8  PLT 163 0000000   Basic Metabolic Panel: Recent Labs  Lab 06/25/21 0805 06/26/21 0113  NA 139 139  K 4.3 3.6  CL 103 104  CO2 26 24  GLUCOSE 124* 116*  BUN 21 15  CREATININE 0.75 0.74  CALCIUM 9.2 8.7*   GFR: Estimated Creatinine Clearance: 58.2 mL/min (by C-G formula based on SCr of 0.74 mg/dL). Liver Function Tests: No results for input(s): AST, ALT, ALKPHOS, BILITOT, PROT, ALBUMIN in the last 168 hours. No results for input(s): LIPASE, AMYLASE in the last 168 hours. No results for input(s): AMMONIA in the last 168 hours. Coagulation Profile: Recent Labs  Lab 06/25/21 0805  INR 1.1   Cardiac Enzymes: No results for input(s): CKTOTAL,  CKMB, CKMBINDEX, TROPONINI in the last 168 hours. BNP (last 3 results) No results for input(s): PROBNP in the last 8760 hours. HbA1C: No results for input(s): HGBA1C in the last 72 hours. CBG: No results for input(s): GLUCAP in the last 168 hours. Lipid Profile: No results for input(s): CHOL, HDL, LDLCALC, TRIG, CHOLHDL, LDLDIRECT in the last 72 hours. Thyroid Function Tests: No results for input(s): TSH, T4TOTAL, FREET4, T3FREE, THYROIDAB in the last 72 hours. Anemia Panel: No results for input(s): VITAMINB12, FOLATE, FERRITIN, TIBC, IRON, RETICCTPCT in the last 72 hours. Urine analysis:    Component Value Date/Time   COLORURINE YELLOW 02/12/2008 [place] 02/12/2008 1658   LABSPEC 1.017 02/12/2008 1658   PHURINE 7.5 02/12/2008 1658   GLUCOSEU NEGATIVE 02/12/2008 1658   HGBUR NEGATIVE 02/12/2008 1658   BILIRUBINUR neg 05/07/2012 1253   KETONESUR NEGATIVE 02/12/2008 1658   PROTEINUR neg 05/07/2012 1253   PROTEINUR NEGATIVE 02/12/2008 1658   UROBILINOGEN 0.2 05/07/2012 1253   UROBILINOGEN 0.2 02/12/2008 1658   NITRITE neg 05/07/2012 1253   NITRITE NEGATIVE 02/12/2008 1658   LEUKOCYTESUR Negative 05/07/2012 1253   Sepsis Labs: '@LABRCNTIP'$ (procalcitonin:4,lacticidven:4)  ) Recent Results (from the past 240 hour(s))  Resp Panel by RT-PCR (Flu A&B, Covid) Nasopharyngeal Swab     Status: None   Collection Time: 06/25/21 11:47 AM   Specimen: Nasopharyngeal Swab; Nasopharyngeal(NP) swabs in vial transport medium  Result Value Ref Range Status   SARS Coronavirus 2 by RT PCR NEGATIVE NEGATIVE Final    Comment: (NOTE) SARS-CoV-2 target nucleic acids are NOT DETECTED.  The SARS-CoV-2 RNA is generally detectable in upper respiratory specimens during the acute phase of infection. The lowest concentration of SARS-CoV-2 viral copies this assay can detect is 138 copies/mL. A negative result does not preclude SARS-Cov-2 infection and should not be used as the sole basis  for treatment or other patient management decisions. A negative result may occur with  improper specimen collection/handling, submission of specimen other than nasopharyngeal swab, presence of viral mutation(s) within the areas targeted by this assay, and inadequate number of viral copies(<138 copies/mL). A negative result must be combined with clinical observations, patient history, and epidemiological information. The expected result is Negative.  Fact Sheet for Patients:  EntrepreneurPulse.com.au  Fact Sheet for Healthcare Providers:  IncredibleEmployment.be  This test is no t yet approved or cleared by the [place] FDA and  has been authorized for detection and/or diagnosis of SARS-CoV-2 by FDA under an Emergency Use Authorization (EUA). This EUA will remain  in effect (meaning this test can be used) for the duration of the COVID-19 declaration under Section 564(b)(1) of the Act, 21 U.S.C.section 360bbb-3(b)(1), unless the authorization is terminated  or revoked sooner.       Influenza A by PCR NEGATIVE NEGATIVE Final   Influenza B by PCR NEGATIVE NEGATIVE Final    Comment: (NOTE) The Xpert Xpress SARS-CoV-2/FLU/RSV plus assay is intended as an aid in the diagnosis  of influenza from Nasopharyngeal swab specimens and should not be used as a sole basis for treatment. Nasal washings and aspirates are unacceptable for Xpert Xpress SARS-CoV-2/FLU/RSV testing.  Fact Sheet for Patients: EntrepreneurPulse.com.au  Fact Sheet for Healthcare Providers: IncredibleEmployment.be  This test is not yet approved or cleared by the [place] FDA and has been authorized for detection and/or diagnosis of SARS-CoV-2 by FDA under an Emergency Use Authorization (EUA). This EUA will remain in effect (meaning this test can be used) for the duration of the COVID-19 declaration under Section 564(b)(1) of the Act, 21  U.S.C. section 360bbb-3(b)(1), unless the authorization is terminated or revoked.  Performed at [facility], [place] [address], [place], [place] [address]          Radiology Studies: CT Angio Chest Pulmonary Embolism (PE) W or WO Contrast  Result Date: 06/25/2021 CLINICAL DATA:  70 year old female with left iliofemoral deep vein thrombosis, possible pulmonary embolism on abdomen pelvis CT venogram. EXAM: CT ANGIOGRAPHY CHEST WITH CONTRAST TECHNIQUE: Multidetector CT imaging of the chest was performed using the standard protocol during bolus administration of intravenous contrast. Multiplanar CT image reconstructions and MIPs were obtained to evaluate the vascular anatomy. CONTRAST:  Seventy-five mL Omnipaque 350, intravenous COMPARISON:  06/25/2021, 02/08/2018 FINDINGS: Cardiovascular: Satisfactory opacification of the pulmonary arteries to the segmental level. Non occlusive subsegmental pulmonary embolism in the posterior left lower lobe. Nonocclusive distal lobar/proximal segmental pulmonary embolism in the right upper lobe. Right to left ventricular ratio is less than 0.9. Normal heart size. No pericardial effusion. Mediastinum/Nodes: No enlarged mediastinal, hilar, or axillary lymph nodes. Thyroid gland, trachea, and esophagus demonstrate no significant findings. Lungs/Pleura: Minimal bibasilar subsegmental atelectasis. No focal consolidations. No pleural effusion or pneumothorax. Upper Abdomen: The visualized upper abdomen is within normal limits. Musculoskeletal: No chest wall abnormality. No acute or significant osseous findings. Review of the MIP images confirms the above findings. IMPRESSION: Vascular: Small burden of right upper lobar/segmental and left lower lobe posterior segmental acute pulmonary emboli. No evidence of right heart strain. Non-Vascular: Minimal bibasilar subsegmental atelectasis. These results were called by telephone at the time of interpretation on  06/25/2021 at 1:41 pm to provider [facility] , who verbally acknowledged these results. Ruthann Cancer, MD [facility] Electronically Signed   By: Ruthann Cancer MD   On: 06/25/2021 13:42   CT VENOGRAM ABD/PEL  Result Date: 06/25/2021 CLINICAL DATA:  70 year old with left groin and calf pain 3 weeks after abdominoplasty surgery. Patient has DVT in the left lower extremity. CT venogram needed to evaluate for May-Thurner syndrome and left iliac venous thrombosis. EXAM: CT VENOGRAM ABDOMEN AND PELVIS TECHNIQUE: Multiplanar imaging of the abdomen and pelvis was obtained following administration of intravenous contrast. Venogram protocol was used. CONTRAST:  112m OMNIPAQUE IOHEXOL 350 MG/ML SOLN COMPARISON:  05/07/2012 FINDINGS: Lung bases: Linear densities at lung bases are suggestive for atelectasis. Limited evaluation for pulmonary emboli. Questionable small filling defect in the left lower lobe segmental or subsegmental pulmonary artery on sequence 5 image 2. Hepatobiliary: Slightly decreased attenuation of the liver. Normal appearance of the gallbladder. No suspicious hepatic lesion. Hepatic veins and portal venous system is patent. Pancreas: No evidence for acute inflammation or duct dilatation. Spleen: Normal size.  Single small calcification. Adrenals and kidneys: Normal adrenal glands. Negative for hydronephrosis. Small hypodensity in the posterior left kidney likely represents a cyst. No suspicious renal lesions. Normal appearance of the urinary bladder. Evaluation of bladder is limited due to  right hip arthroplasty artifact. Stomach and bowel structures: Normal appearance of the stomach. Suspect previous appendectomy. No evidence for bowel inflammation or obstruction. Lymphatics: No abdominopelvic lymphadenopathy. Arterial structures: Atherosclerotic calcifications in the abdominal aorta without aneurysm. Main visceral arteries are patent. Venous  structures: Thrombosis of the left common iliac vein and left external iliac vein. Proximal left femoral veins are occluded. Expansion and thrombosis of the proximal left great saphenous vein. Right common femoral vein is patent. Right iliac veins are patent. Small focus of thrombus at the junction of the IVC and left common iliac vein. Bilateral renal veins are patent. Incidentally, there is a low lying left retroaortic renal vein which is patent. Inflammatory changes around the proximal aspect of the left common iliac vein thrombosis. Other: Negative for ascites. Stranding in the anterior abdominal soft subcutaneous tissues compatible with recent abdominoplasty surgery. There are tiny low-density collections along the lateral abdominal wall, right side greater than left. Right-sided small fluid collection measures roughly 6 mm in thickness. The left-sided collection measures roughly 5 mm in thickness. No evidence for intra-abdominal ascites. Negative for free air. Reproductive: Hypodense structure along the lower aspect the uterus measures roughly 2.0 cm and could represent a uterine fibroid. No evidence for an adnexal mass. Musculoskeletal: Right hip replacement is located. No acute bone abnormality. IMPRESSION: 1. Positive for deep venous thrombosis involving the left iliac venous system. 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 Inflammatory changes around the left common iliac vein thrombosis. Tiny focus of thrombus extends into the distal IVC at the IVC and left common iliac vein junction. Majority of the IVC is widely patent. 2. Question a tiny filling defect in a left lower lobe pulmonary artery. This study was not designed to evaluate for pulmonary embolism but cannot exclude small focus of pulmonary embolism at this location. This could be further characterized with  a chest CTA. 3. Expected postsurgical changes from recent abdominoplasty surgery. Small fluid collections along the lateral lower abdomen bilaterally. These results were called by telephone at the time of interpretation on 06/25/2021 at 11:00 am to provider [facility] , who verbally acknowledged these results. Electronically Signed   By: Markus Daft M.D.   On: 06/25/2021 12:12   VAS [place] LOWER EXTREMITY VENOUS (DVT) (ONLY [facility] & [facility])  Result Date: 06/25/2021  Lower Venous DVT Study Patient Name:  NANNETTE HUN  Date of Exam:   06/25/2021 Medical Rec #: [id number]         Accession #:    [id number] Date of Birth: 02/14/1951          Patient Gender: F Patient Age:   70Y Exam Location:  [facility] Procedure:      VAS [place] LOWER EXTREMITY VENOUS (DVT) Referring Phys: [id number] JON KNAPP --------------------------------------------------------------------------------  Indications: Pain. Other Indications: Left groin pain and calf pain 3 weeks post tummy tuck. Comparison Study: No previous exams Performing Technologist: Jody Hill RVT, RDMS  Examination Guidelines: A complete evaluation includes B-mode imaging, spectral Doppler, color Doppler, and power Doppler as needed of all accessible portions of each vessel. Bilateral testing is considered an integral part of a complete examination. Limited examinations for reoccurring indications may be performed as noted. The reflux portion of the exam is performed with the patient in reverse Trendelenburg.  +-----+---------------+---------+-----------+----------+--------------+ RIGHTCompressibilityPhasicitySpontaneityPropertiesThrombus Aging +-----+---------------+---------+-----------+----------+--------------+ CFV  Full  Yes      Yes                                 +-----+---------------+---------+-----------+----------+--------------+   +---------+---------------+---------+-----------+----------+--------------+ LEFT      CompressibilityPhasicitySpontaneityPropertiesThrombus Aging +---------+---------------+---------+-----------+----------+--------------+ CFV      None           No       No                   Acute          +---------+---------------+---------+-----------+----------+--------------+ SFJ      None                                         Acute          +---------+---------------+---------+-----------+----------+--------------+ FV Prox  None           No       No                   Acute          +---------+---------------+---------+-----------+----------+--------------+ FV Mid   None           No       No                   Acute          +---------+---------------+---------+-----------+----------+--------------+ FV DistalNone           No       No                   Acute          +---------+---------------+---------+-----------+----------+--------------+ PFV      None           No       No                   Acute          +---------+---------------+---------+-----------+----------+--------------+ POP      None           No       No                   Acute          +---------+---------------+---------+-----------+----------+--------------+ PTV      Full                                                        +---------+---------------+---------+-----------+----------+--------------+ PERO     None           No       No                   Acute          +---------+---------------+---------+-----------+----------+--------------+ Gastroc  None           No       No                   Acute          +---------+---------------+---------+-----------+----------+--------------+ GSV      None  No       No                   Acute          +---------+---------------+---------+-----------+----------+--------------+ Unable to image iliac system due to bandage from surgery.    Summary: RIGHT: - No evidence of common femoral vein obstruction.  LEFT: -  Findings consistent with acute deep vein thrombosis involving the left common femoral vein, SF junction, left femoral vein, left proximal profunda vein, left popliteal vein, left peroneal veins, and left gastrocnemius veins. - Findings consistent with acute superficial vein thrombosis involving the left great saphenous vein. - No cystic structure found in the popliteal fossa.  *See table(s) above for measurements and observations. Electronically signed by Deitra Mayo MD on 06/25/2021 at 5:04:44 PM.    Final         Scheduled Meds:  calcium-vitamin D  1 tablet Oral Q breakfast   cholecalciferol  2,000 Units Oral Daily   enoxaparin (LOVENOX) injection  70 mg Subcutaneous Q12H   lidocaine       losartan  25 mg Oral Daily   Continuous Infusions:   LOS: 1 day    Time spent: 71mn  PDomenic Polite MD [facility]   06/26/2021, 3:12 PM

## 2021-06-26 NOTE — Procedures (Signed)
Interventional Radiology Procedure Note  Procedure:  1) Right greater saphenous vein and left common femoral vein access 2) Suction thrombectomy from the left common iliac to anterior tibial vein 3) Balloon venoplasty of left common femoral vein 4) Intravascular ultrasound 5) Stent placement in left common iliac vein  Findings: Please refer to procedural dictation for full description.  Extensive clot burden extending from calf veins to left common iliac vein.  Successful suction thrombectomy with 7 Fr and 12 Fr Penumbra aspiration catheters.  Venoplasty of left common femoral vein to 10 mm due to trace residual thrombus.  IVUS evaluation confirmed high grade stenosis secondary to May Thurner lesion.  Successful placement of 16 mm x 100 mm Abre stent.  Complications: None immediate  Estimated Blood Loss: 500 mL  Recommendations: -Bedrest for 2 hours -Lovenox 67.5 mL administered prior to leaving IR suite (heparin discontinued).  Will continue BID for at least 1 month. -Left TED hose placed.  Please place LLE SCD and use at all times unless ambulating. -Encourage ambulation once bedrest complete -IR will follow -Plan for 1 month outpatient follow up in IR clinic (we will arrange)    Ruthann Cancer, MD Pager: [phone]

## 2021-06-27 ENCOUNTER — Inpatient Hospital Stay (HOSPITAL_COMMUNITY): Payer: Medicare Other

## 2021-06-27 DIAGNOSIS — I2609 Other pulmonary embolism with acute cor pulmonale: Secondary | ICD-10-CM

## 2021-06-27 LAB — ECHOCARDIOGRAM COMPLETE
Area-P 1/2: 2.91 cm2
Height: 63 in
MV M vel: 4.31 m/s
MV Peak grad: 74.3 mmHg
S' Lateral: 3.1 cm
Weight: 2342.17 oz

## 2021-06-27 LAB — CBC
HCT: 28.1 % — ABNORMAL LOW (ref 36.0–46.0)
HCT: 27 % — ABNORMAL LOW (ref 36.0–46.0)
Hemoglobin: 8.8 g/dL — ABNORMAL LOW (ref 12.0–15.0)
Hemoglobin: 8.8 g/dL — ABNORMAL LOW (ref 12.0–15.0)
MCH: 30 pg (ref 26.0–34.0)
MCH: 30.6 pg (ref 26.0–34.0)
MCHC: 31.3 g/dL (ref 30.0–36.0)
MCHC: 32.6 g/dL (ref 30.0–36.0)
MCV: 95.9 fL (ref 80.0–100.0)
MCV: 93.8 fL (ref 80.0–100.0)
Platelets: 166 10*3/uL (ref 150–400)
Platelets: 186 10*3/uL (ref 150–400)
RBC: 2.93 MIL/uL — ABNORMAL LOW (ref 3.87–5.11)
RBC: 2.88 MIL/uL — ABNORMAL LOW (ref 3.87–5.11)
RDW: 14.3 % (ref 11.5–15.5)
RDW: 13.8 % (ref 11.5–15.5)
WBC: 10.4 10*3/uL (ref 4.0–10.5)
WBC: 10.3 10*3/uL (ref 4.0–10.5)
nRBC: 0 % (ref 0.0–0.2)
nRBC: 0 % (ref 0.0–0.2)

## 2021-06-27 LAB — BASIC METABOLIC PANEL
Anion gap: 7 (ref 5–15)
BUN: 19 mg/dL (ref 8–23)
CO2: 25 mmol/L (ref 22–32)
Calcium: 7.8 mg/dL — ABNORMAL LOW (ref 8.9–10.3)
Chloride: 105 mmol/L (ref 98–111)
Creatinine, Ser: 0.9 mg/dL (ref 0.44–1.00)
GFR, Estimated: >60 mL/min (ref >60)
Glucose, Bld: 149 mg/dL — ABNORMAL HIGH (ref 70–99)
Potassium: 3.8 mmol/L (ref 3.5–5.1)
Sodium: 137 mmol/L (ref 135–145)

## 2021-06-27 NOTE — Progress Notes (Signed)
  Echocardiogram 2D Echocardiogram has been performed.  Fidel Levy 06/27/2021, 11:47 AM

## 2021-06-27 NOTE — Progress Notes (Signed)
Recovering well from LLE venous thrombectomy and left iliac stent placement yesterday.  Noticeable decrease in swelling and firmness of left calf and thigh.  No pain, dyspnea, or complaints.  Has not ambulated independently.  Unfortunately only has knee high compression hose and no SCDs in place.    Hb drop to 8.8 today from 10.7 yesterday, not unexpected with 500 mL blood loss due to thrombectomy.  Pulse in low 100s, normal O2 sats on room air.     -please ensure left THIGH HIGH compression stocking is placed.  Please wear at least 20-30 mmHg left thigh high compression stocking at all times after discharge unless sleeping -please apply at least left SCD and use at all times unless ambulating while in hospital -please encourage ambulation -agree with continuing 70 mg Lovenox BID for one month -no barriers to discharge from IR standpoint -will follow up in IR clinic (our office will set up) in 1 month with CTV abdomen/pelvis and LLE venous duplex study   Ruthann Cancer, MD Pager: [phone]

## 2021-06-27 NOTE — Plan of Care (Signed)
  Problem: Education: Goal: Knowledge of General Education information will improve Description: Including pain rating scale, medication(s)/side effects and non-pharmacologic comfort measures Outcome: Progressing   Problem: Health Behavior/Discharge Planning: Goal: Ability to manage health-related needs will improve Outcome: Progressing   Problem: Clinical Measurements: Goal: Ability to maintain clinical measurements within normal limits will improve Outcome: Progressing Goal: Will remain free from infection Outcome: Progressing Goal: Diagnostic test results will improve Outcome: Progressing   Problem: Pain Managment: Goal: General experience of comfort will improve Outcome: Progressing   Problem: Skin Integrity: Goal: Risk for impaired skin integrity will decrease Outcome: Progressing

## 2021-06-27 NOTE — Progress Notes (Signed)
PROGRESS NOTE    Christina [name]  [id number] DOB: Mar 01, 1951 DOA: 06/25/2021 PCP: Kathyrn Lass, MD  Brief Narrative: 70 year old female with history of migraines, paroxysmal atrial fibrillation status post ablations x2, no longer on anticoagulation, hypertension, underwent a mini tummy tuck/liposuction 3 weeks ago, presented to the ED with new onset left groin pain and swelling in her left leg. -Upon work-up was noted to have extensive left leg DVT with significant clot burden, CTA positive for bilateral subsegmental PE   Assessment & Plan:   Acute extensive left leg DVT May Thurner syndrome -Treated with IV heparin on admission, interventional radiology was consulted by EDP on admission -Underwent suction thrombectomy, balloon valvuloplasty of left common femoral vein and stent placement and left common iliac vein on 8/3 -Switched over to subcu Lovenox now -Dr.Sutton recommended to continue twice daily Lovenox for at least a month, after this can probably base transitioned over to [place] -Will order thigh-high compression stockings and SCDs -Increase activity today  Acute blood loss anemia -Apparently had 500 mL blood loss due to thrombectomy, hemoglobin is 8.8 -Recheck this afternoon  History of migraines -Continue home regimen, stable now  History of PAF -In sinus rhythm at this time  Recent tummy tuck/liposuction -Site appears unremarkable, follow-up with Dr. Marla Roe   DVT prophylaxis: Lovenox Code Status: Full code Family Communication: Spouse at bedside yesterday Disposition Plan:  Status is: Inpatient  Remains inpatient appropriate because:Inpatient level of care appropriate due to severity of illness  Dispo: The patient is from: Home              Anticipated d/c is to: Home tomorrow              Patient currently is not medically stable to d/c.   Difficult to place patient No   Consultants:  Interventional radiology  Procedures: Interventional Radiology  Procedure Note   Procedure: Dr. Demetrios Loll 8/3 1) Right greater saphenous vein and left common femoral vein access 2) Suction thrombectomy from the left common iliac to anterior tibial vein 3) Balloon venoplasty of left common femoral vein 4) Intravascular ultrasound 5) Stent placement in left common iliac vein    Antimicrobials:    Subjective: -Feels okay overall, mild abdominal discomfort yesterday, better today, some dizziness this morning  Objective: Vitals:   06/27/21 0330 06/27/21 0600 06/27/21 0714 06/27/21 1143  BP: (!) 109/52 (!) 110/51 (!) 116/53 (!) 99/46  Pulse: 92 94 (!) 110 99  Resp: '15 18 19 20  '$ Temp: 98.8 F (37.1 C)  98.9 F (37.2 C) 98.9 F (37.2 C)  TempSrc: Oral  Oral Oral  SpO2: 99% 98% 98% 98%  Weight:      Height:        Intake/Output Summary (Last 24 hours) at 06/27/2021 1202 Last data filed at 06/27/2021 0800 Gross per 24 hour  Intake 480 ml  Output --  Net 480 ml   Filed Weights   06/25/21 1616  Weight: 66.4 kg    Examination:  General exam: Pleasant female laying in bed, AAOx3, no distress HEENT: No JVD CVS: S1-S2, regular rate rhythm Lungs: Decreased breath sounds at the bases  Abdomen: Soft, nontender, lower abdominal dressing noted, bowel sounds present Extremities: Very mild left leg swelling, groin with dressing Skin: No rashes Psychiatry: Judgement and insight appear normal. Mood & affect appropriate.     Data Reviewed:   CBC: Recent Labs  Lab 06/25/21 0805 06/26/21 0113 06/27/21 0016  WBC 11.2* 10.7* 10.4  HGB 11.4* 10.7*  8.8*  HCT 35.1* 33.3* 28.1*  MCV 94.4 93.8 95.9  PLT 163 176 [id number]   Basic Metabolic Panel: Recent Labs  Lab 06/25/21 0805 06/26/21 0113 06/27/21 0016  NA 139 139 137  K 4.3 3.6 3.8  CL 103 104 105  CO2 '26 24 25  '$ GLUCOSE 124* 116* 149*  BUN '21 15 19  '$ CREATININE 0.75 0.74 0.90  CALCIUM 9.2 8.7* 7.8*   GFR: Estimated Creatinine Clearance: 51.7 mL/min (by C-G formula based on SCr of 0.9  mg/dL). Liver Function Tests: No results for input(s): AST, ALT, ALKPHOS, BILITOT, PROT, ALBUMIN in the last 168 hours. No results for input(s): LIPASE, AMYLASE in the last 168 hours. No results for input(s): AMMONIA in the last 168 hours. Coagulation Profile: Recent Labs  Lab 06/25/21 0805  INR 1.1   Cardiac Enzymes: No results for input(s): CKTOTAL, CKMB, CKMBINDEX, TROPONINI in the last 168 hours. BNP (last 3 results) No results for input(s): PROBNP in the last 8760 hours. HbA1C: No results for input(s): HGBA1C in the last 72 hours. CBG: No results for input(s): GLUCAP in the last 168 hours. Lipid Profile: No results for input(s): CHOL, HDL, LDLCALC, TRIG, CHOLHDL, LDLDIRECT in the last 72 hours. Thyroid Function Tests: No results for input(s): TSH, T4TOTAL, FREET4, T3FREE, THYROIDAB in the last 72 hours. Anemia Panel: No results for input(s): VITAMINB12, FOLATE, FERRITIN, TIBC, IRON, RETICCTPCT in the last 72 hours. Urine analysis:    Component Value Date/Time   COLORURINE YELLOW 02/12/2008 [place] 02/12/2008 1658   LABSPEC 1.017 02/12/2008 1658   PHURINE 7.5 02/12/2008 1658   GLUCOSEU NEGATIVE 02/12/2008 1658   HGBUR NEGATIVE 02/12/2008 1658   BILIRUBINUR neg 05/07/2012 1253   KETONESUR NEGATIVE 02/12/2008 1658   PROTEINUR neg 05/07/2012 1253   PROTEINUR NEGATIVE 02/12/2008 1658   UROBILINOGEN 0.2 05/07/2012 1253   UROBILINOGEN 0.2 02/12/2008 1658   NITRITE neg 05/07/2012 1253   NITRITE NEGATIVE 02/12/2008 1658   LEUKOCYTESUR Negative 05/07/2012 1253   Sepsis Labs: '@LABRCNTIP'$ (procalcitonin:4,lacticidven:4)  ) Recent Results (from the past 240 hour(s))  Resp Panel by RT-PCR (Flu A&B, Covid) Nasopharyngeal Swab     Status: None   Collection Time: 06/25/21 11:47 AM   Specimen: Nasopharyngeal Swab; Nasopharyngeal(NP) swabs in vial transport medium  Result Value Ref Range Status   SARS Coronavirus 2 by RT PCR NEGATIVE NEGATIVE Final    Comment:  (NOTE) SARS-CoV-2 target nucleic acids are NOT DETECTED.  The SARS-CoV-2 RNA is generally detectable in upper respiratory specimens during the acute phase of infection. The lowest concentration of SARS-CoV-2 viral copies this assay can detect is 138 copies/mL. A negative result does not preclude SARS-Cov-2 infection and should not be used as the sole basis for treatment or other patient management decisions. A negative result may occur with  improper specimen collection/handling, submission of specimen other than nasopharyngeal swab, presence of viral mutation(s) within the areas targeted by this assay, and inadequate number of viral copies(<138 copies/mL). A negative result must be combined with clinical observations, patient history, and epidemiological information. The expected result is Negative.  Fact Sheet for Patients:  EntrepreneurPulse.com.au  Fact Sheet for Healthcare Providers:  IncredibleEmployment.be  This test is no t yet approved or cleared by the [place] FDA and  has been authorized for detection and/or diagnosis of SARS-CoV-2 by FDA under an Emergency Use Authorization (EUA). This EUA will remain  in effect (meaning this test can be used) for the duration of the COVID-19 declaration under Section 564(b)(1)  of the Act, 21 U.S.C.section 360bbb-3(b)(1), unless the authorization is terminated  or revoked sooner.       Influenza A by PCR NEGATIVE NEGATIVE Final   Influenza B by PCR NEGATIVE NEGATIVE Final    Comment: (NOTE) The Xpert Xpress SARS-CoV-2/FLU/RSV plus assay is intended as an aid in the diagnosis of influenza from Nasopharyngeal swab specimens and should not be used as a sole basis for treatment. Nasal washings and aspirates are unacceptable for Xpert Xpress SARS-CoV-2/FLU/RSV testing.  Fact Sheet for Patients: EntrepreneurPulse.com.au  Fact Sheet for Healthcare  Providers: IncredibleEmployment.be  This test is not yet approved or cleared by the [place] FDA and has been authorized for detection and/or diagnosis of SARS-CoV-2 by FDA under an Emergency Use Authorization (EUA). This EUA will remain in effect (meaning this test can be used) for the duration of the COVID-19 declaration under Section 564(b)(1) of the Act, 21 U.S.C. section 360bbb-3(b)(1), unless the authorization is terminated or revoked.  Performed at [facility], [place] [address], [place], [place] [address]          Radiology Studies: CT Angio Chest Pulmonary Embolism (PE) W or WO Contrast  Result Date: 06/25/2021 CLINICAL DATA:  70 year old female with left iliofemoral deep vein thrombosis, possible pulmonary embolism on abdomen pelvis CT venogram. EXAM: CT ANGIOGRAPHY CHEST WITH CONTRAST TECHNIQUE: Multidetector CT imaging of the chest was performed using the standard protocol during bolus administration of intravenous contrast. Multiplanar CT image reconstructions and MIPs were obtained to evaluate the vascular anatomy. CONTRAST:  Seventy-five mL Omnipaque 350, intravenous COMPARISON:  06/25/2021, 02/08/2018 FINDINGS: Cardiovascular: Satisfactory opacification of the pulmonary arteries to the segmental level. Non occlusive subsegmental pulmonary embolism in the posterior left lower lobe. Nonocclusive distal lobar/proximal segmental pulmonary embolism in the right upper lobe. Right to left ventricular ratio is less than 0.9. Normal heart size. No pericardial effusion. Mediastinum/Nodes: No enlarged mediastinal, hilar, or axillary lymph nodes. Thyroid gland, trachea, and esophagus demonstrate no significant findings. Lungs/Pleura: Minimal bibasilar subsegmental atelectasis. No focal consolidations. No pleural effusion or pneumothorax. Upper Abdomen: The visualized upper abdomen is within normal limits. Musculoskeletal: No chest wall abnormality. No  acute or significant osseous findings. Review of the MIP images confirms the above findings. IMPRESSION: Vascular: Small burden of right upper lobar/segmental and left lower lobe posterior segmental acute pulmonary emboli. No evidence of right heart strain. Non-Vascular: Minimal bibasilar subsegmental atelectasis. These results were called by telephone at the time of interpretation on 06/25/2021 at 1:41 pm to provider [facility] , who verbally acknowledged these results. Ruthann Cancer, MD [facility] Radiology Electronically Signed   By: Ruthann Cancer MD   On: 06/25/2021 13:42   IR Veno/Ext/Uni Left  Result Date: 06/27/2021 INDICATION: 70 year old female with acute left lower extremity deep vein thrombosis extending from the calf veins to the left common iliac vein, suspicious for May-Thurner syndrome. History of recent abdominoplasty. EXAM: 1. Ultrasound-guided vascular access of the right greater saphenous vein. 2. Ultrasound-guided vascular access of the left common femoral vein. 3. Left lower extremity venogram. 4. Mechanical aspiration thrombectomy of the left anterior tibial vein, left popliteal vein, left femoral vein, left common femoral vein, left external iliac vein, and left common iliac vein 5. Balloon venoplasty of the left common femoral vein. 6. Intravascular ultrasound. 7. Stent placement in the left common iliac vein with post stent deployment balloon molding. 8. Completion venogram. COMPARISON:  06/25/2021 MEDICATIONS: None. ANESTHESIA/SEDATION: Versed 3 mg IV; Fentanyl 150 mcg IV  Moderate Sedation Time:  2 hours and 24 minutes The patient was continuously monitored during the procedure by the interventional radiology nurse under my direct supervision. FLUOROSCOPY TIME:  Fluoroscopy Time: 32 minutes 48 seconds (326 mGy). CONTRAST:  175 mL Omnipaque 300, intravenous COMPLICATIONS: None immediate. TECHNIQUE: Informed written consent was obtained from  the patient after a thorough discussion of the procedural risks, benefits and alternatives. All questions were addressed. Maximal Sterile Barrier Technique was utilized including caps, mask, sterile gowns, sterile gloves, sterile drape, hand hygiene and skin antiseptic. A timeout was performed prior to the initiation of the procedure. The patient was positioned supine on the IR table. The right groin and left lower extremity were prepped and draped in standard fashion. Preprocedure ultrasound evaluation of the right common femoral and right greater saphenous vein demonstrated patency and compressibility. Access to the central right greater saphenous vein was planned. Subdermal Local anesthesia was provided with 1% lidocaine. A small skin nick was made. Under direct ultrasound visualization, the central right greater saphenous vein was accessed with a 21 gauge micropuncture needle. An image was captured and stored in the permanent record. Over the micropuncture wire, a micropuncture sheath was inserted. A Wholey wire was then directed to the inferior vena cava and a 5 [place], 11 cm vascular sheath was placed. Baseline activated clotting time was measured and intravenous heparin boluses were administered throughout the procedure to obtain adequate anticoagulation. A 4 French omni Flush catheter was then directed to the peripheral aspect of the inferior vena cava. Limited inferior vena cavagram was performed which demonstrated patency and brisk antegrade flow. A Glidewire was then directed to the left common iliac vein, over which the Omni Flush catheter was exchanged for a 5 French Navicross catheter. The catheter and Glidewire were then directed to the left external iliac vein, however the common femoral vein was unable to be selected. Therefore, attention was turned to gaining antegrade access of the left lower extremity. Ultrasound evaluation of the left posterior tibial vein demonstrated patency with a single safe  access point just superior to the level of the ankle. Local anesthesia was provided 1% lidocaine. Under direct ultrasound visualization, the left posterior tibial vein was attempted to be accessed, however vaso spasm occurred which prevented access. Therefore, the left common femoral vein was identified which demonstrated expansile, occlusive thrombus. Access was planned. Subdermal Local anesthesia was provided 1% lidocaine at the planned needle entry site. A small skin nick was made. Under direct ultrasound visualization, the right common femoral vein was accessed with a 21 gauge micropuncture needle. An image was captured and stored in the permanent record. A 0.018 inch Nitrex wire was then inserted which was able to easily be passed to the level of the left common iliac vein. Micropuncture sheath was inserted and a Glidewire was passed to the level of the inferior vena cava. The indwelling right GSV 5 French sheath was then exchanged for a 33 cm 9 French vascular sheath. A 6 French, 15 mm loop snare was then inserted to capture the Glidewire from the left-sided access to establish through and through access. From the right sheath, the 90 cross catheter was advanced over the Glidewire into the left common femoral vein, the wire was retracted into the left common femoral vein, thus allowing selection of the left femoral vein. The catheter and wire were directed through the left femoral vein in into the left anterior tibial vein. Left lower extremity venogram was then performed which demonstrated near complete occlusion  of the entirety of the popliteal vein, femoral vein, profundus, common femoral vein, external iliac vein, and internal iliac vein. The catheter was removed over a Wholey wire. Computer-assisted suction thrombectomy was then performed with a 7 [place] Lightning Penumbra aspiration catheter with a single pass in a retrograde fashion over the indwelling wire. The wire was then removed and thrombectomy  was performed in an antegrade fashion to the level of the external iliac vein. Copious acute appearing thrombus was aspirated into the canister. Due to the larger vessel size, the 7 French catheter was then exchanged for a 12 [place] Lightning Penumbra aspiration catheter after exchange of the indwelling 9 French sheath for an 46 French sheath, and thrombectomy was performed in a retrograde fashion over the wire to the level of the common femoral vein, then the wire was removed and additional aspiration was performed in antegrade fashion to the level of the iliac confluence. Additional copious acute appearing thrombus was aspirated into the canister. The Glidewire was then advanced once more to the popliteal vein and repeat left lower extremity venogram was performed. Venogram demonstrated significantly improved patency and antegrade flow throughout the popliteal, femoral, common femoral, and external iliac veins. There was high-grade, near obstructive stenosis in the left common iliac vein. There were several rounded filling defects in the left common femoral vein compatible with residual thrombus. Therefore, balloon maceration was performed in the left common femoral vein with a 10 mm by 4 cm Conquest balloon. Limited repeat venogram demonstrated significantly improved patency inflow through the treated segment. Attention was then turned toward addressing the May-Thurner lesion. A 9 French vascular sheath was then inserted via the left common femoral vein access and directed to the level of the IVC. Intravascular ultrasound was performed of the left external and internal iliac veins which demonstrated severe, greater than 95% stenosis of the left common iliac vein at the level of the right common iliac artery. There was sluggish, near stagnant flow in the peripheral common iliac and left external iliac veins. Size measurements were obtained. Next, under direct fluoroscopic visualization, a 16 mm x 100 mm Medtronic  Abre stent was deployed with the central portion at the level of the common iliac confluence, extending into the central external iliac vein. Post appointment balloon molding was performed with a 16 mm x 4 cm Atlas balloon. Intravascular ultrasound evaluation was again performed which demonstrated significantly improved patency of the left iliac veins with excellent wall apposition of the indwelling stent. The stent appeared well positioned with the medial aspect of the central and of the stent at the level of the iliac confluence, with minimal overhang into the inferior vena cava. Completion venogram left lower extremity was performed which demonstrated patency and brisk antegrade flow through the iliac veins and stent. The indwelling sheaths were removed and hemostasis was achieved with manual pressure. Bandages were applied. A single therapeutic dose of Lovenox was administered prior to departing the IR suite. The patient tolerated the procedure well without immediate complication. FINDINGS: Acute deep vein thrombosis extending from the calf veins to the left common iliac vein. Severe focal stenosis in the common left iliac vein. IMPRESSION: 1. Acute deep vein thrombosis extending from the left common iliac vein to the left calf veins secondary to May-Thurner lesion. 2. Successful aspiration thrombectomy from the left anterior tibial vein to the left common iliac vein. 3. Successful placement of left common iliac to left external iliac self expanding uncovered venous stent (16 x 100 mm Abre). PLAN:  1. Therapeutic Lovenox for 1 month. 2. Left lower extremity compression stocking. 3. Follow-up in 1 month in IR clinic with CT venogram of the abdomen and pelvis, and left lower extremity venous duplex. Ruthann Cancer, MD [facility] Radiology Electronically Signed   By: Ruthann Cancer MD   On: 06/27/2021 07:55   IR [facility] MOD SED  Result Date:  06/27/2021 INDICATION: 70 year old female with acute left lower extremity deep vein thrombosis extending from the calf veins to the left common iliac vein, suspicious for May-Thurner syndrome. History of recent abdominoplasty. EXAM: 1. Ultrasound-guided vascular access of the right greater saphenous vein. 2. Ultrasound-guided vascular access of the left common femoral vein. 3. Left lower extremity venogram. 4. Mechanical aspiration thrombectomy of the left anterior tibial vein, left popliteal vein, left femoral vein, left common femoral vein, left external iliac vein, and left common iliac vein 5. Balloon venoplasty of the left common femoral vein. 6. Intravascular ultrasound. 7. Stent placement in the left common iliac vein with post stent deployment balloon molding. 8. Completion venogram. COMPARISON:  06/25/2021 MEDICATIONS: None. ANESTHESIA/SEDATION: Versed 3 mg IV; Fentanyl 150 mcg IV Moderate Sedation Time:  2 hours and 24 minutes The patient was continuously monitored during the procedure by the interventional radiology nurse under my direct supervision. FLUOROSCOPY TIME:  Fluoroscopy Time: 32 minutes 48 seconds (326 mGy). CONTRAST:  175 mL Omnipaque 300, intravenous COMPLICATIONS: None immediate. TECHNIQUE: Informed written consent was obtained from the patient after a thorough discussion of the procedural risks, benefits and alternatives. All questions were addressed. Maximal Sterile Barrier Technique was utilized including caps, mask, sterile gowns, sterile gloves, sterile drape, hand hygiene and skin antiseptic. A timeout was performed prior to the initiation of the procedure. The patient was positioned supine on the IR table. The right groin and left lower extremity were prepped and draped in standard fashion. Preprocedure ultrasound evaluation of the right common femoral and right greater saphenous vein demonstrated patency and compressibility. Access to the central right greater saphenous vein was  planned. Subdermal Local anesthesia was provided with 1% lidocaine. A small skin nick was made. Under direct ultrasound visualization, the central right greater saphenous vein was accessed with a 21 gauge micropuncture needle. An image was captured and stored in the permanent record. Over the micropuncture wire, a micropuncture sheath was inserted. A Wholey wire was then directed to the inferior vena cava and a 5 [place], 11 cm vascular sheath was placed. Baseline activated clotting time was measured and intravenous heparin boluses were administered throughout the procedure to obtain adequate anticoagulation. A 4 French omni Flush catheter was then directed to the peripheral aspect of the inferior vena cava. Limited inferior vena cavagram was performed which demonstrated patency and brisk antegrade flow. A Glidewire was then directed to the left common iliac vein, over which the Omni Flush catheter was exchanged for a 5 French Navicross catheter. The catheter and Glidewire were then directed to the left external iliac vein, however the common femoral vein was unable to be selected. Therefore, attention was turned to gaining antegrade access of the left lower extremity. Ultrasound evaluation of the left posterior tibial vein demonstrated patency with a single safe access point just superior to the level of the ankle. Local anesthesia was provided 1% lidocaine. Under direct ultrasound visualization, the left posterior tibial vein was attempted to be accessed, however vaso spasm occurred which prevented access. Therefore, the left common femoral vein was identified which demonstrated expansile, occlusive thrombus.  Access was planned. Subdermal Local anesthesia was provided 1% lidocaine at the planned needle entry site. A small skin nick was made. Under direct ultrasound visualization, the right common femoral vein was accessed with a 21 gauge micropuncture needle. An image was captured and stored in the permanent  record. A 0.018 inch Nitrex wire was then inserted which was able to easily be passed to the level of the left common iliac vein. Micropuncture sheath was inserted and a Glidewire was passed to the level of the inferior vena cava. The indwelling right GSV 5 French sheath was then exchanged for a 33 cm 9 French vascular sheath. A 6 French, 15 mm loop snare was then inserted to capture the Glidewire from the left-sided access to establish through and through access. From the right sheath, the 90 cross catheter was advanced over the Glidewire into the left common femoral vein, the wire was retracted into the left common femoral vein, thus allowing selection of the left femoral vein. The catheter and wire were directed through the left femoral vein in into the left anterior tibial vein. Left lower extremity venogram was then performed which demonstrated near complete occlusion of the entirety of the popliteal vein, femoral vein, profundus, common femoral vein, external iliac vein, and internal iliac vein. The catheter was removed over a Wholey wire. Computer-assisted suction thrombectomy was then performed with a 7 [place] Lightning Penumbra aspiration catheter with a single pass in a retrograde fashion over the indwelling wire. The wire was then removed and thrombectomy was performed in an antegrade fashion to the level of the external iliac vein. Copious acute appearing thrombus was aspirated into the canister. Due to the larger vessel size, the 7 French catheter was then exchanged for a 12 [place] Lightning Penumbra aspiration catheter after exchange of the indwelling 9 French sheath for an 51 French sheath, and thrombectomy was performed in a retrograde fashion over the wire to the level of the common femoral vein, then the wire was removed and additional aspiration was performed in antegrade fashion to the level of the iliac confluence. Additional copious acute appearing thrombus was aspirated into the canister. The  Glidewire was then advanced once more to the popliteal vein and repeat left lower extremity venogram was performed. Venogram demonstrated significantly improved patency and antegrade flow throughout the popliteal, femoral, common femoral, and external iliac veins. There was high-grade, near obstructive stenosis in the left common iliac vein. There were several rounded filling defects in the left common femoral vein compatible with residual thrombus. Therefore, balloon maceration was performed in the left common femoral vein with a 10 mm by 4 cm Conquest balloon. Limited repeat venogram demonstrated significantly improved patency inflow through the treated segment. Attention was then turned toward addressing the May-Thurner lesion. A 9 French vascular sheath was then inserted via the left common femoral vein access and directed to the level of the IVC. Intravascular ultrasound was performed of the left external and internal iliac veins which demonstrated severe, greater than 95% stenosis of the left common iliac vein at the level of the right common iliac artery. There was sluggish, near stagnant flow in the peripheral common iliac and left external iliac veins. Size measurements were obtained. Next, under direct fluoroscopic visualization, a 16 mm x 100 mm Medtronic Abre stent was deployed with the central portion at the level of the common iliac confluence, extending into the central external iliac vein. Post appointment balloon molding was performed with a 16 mm x 4 cm Atlas  balloon. Intravascular ultrasound evaluation was again performed which demonstrated significantly improved patency of the left iliac veins with excellent wall apposition of the indwelling stent. The stent appeared well positioned with the medial aspect of the central and of the stent at the level of the iliac confluence, with minimal overhang into the inferior vena cava. Completion venogram left lower extremity was performed which demonstrated  patency and brisk antegrade flow through the iliac veins and stent. The indwelling sheaths were removed and hemostasis was achieved with manual pressure. Bandages were applied. A single therapeutic dose of Lovenox was administered prior to departing the IR suite. The patient tolerated the procedure well without immediate complication. FINDINGS: Acute deep vein thrombosis extending from the calf veins to the left common iliac vein. Severe focal stenosis in the common left iliac vein. IMPRESSION: 1. Acute deep vein thrombosis extending from the left common iliac vein to the left calf veins secondary to May-Thurner lesion. 2. Successful aspiration thrombectomy from the left anterior tibial vein to the left common iliac vein. 3. Successful placement of left common iliac to left external iliac self expanding uncovered venous stent (16 x 100 mm Abre). PLAN: 1. Therapeutic Lovenox for 1 month. 2. Left lower extremity compression stocking. 3. Follow-up in 1 month in IR clinic with CT venogram of the abdomen and pelvis, and left lower extremity venous duplex. Ruthann Cancer, MD [facility] Radiology Electronically Signed   By: Ruthann Cancer MD   On: 06/27/2021 07:55   IR US Guide Vasc Access Left  Result Date: 06/27/2021 INDICATION: 70 year old female with acute left lower extremity deep vein thrombosis extending from the calf veins to the left common iliac vein, suspicious for May-Thurner syndrome. History of recent abdominoplasty. EXAM: 1. Ultrasound-guided vascular access of the right greater saphenous vein. 2. Ultrasound-guided vascular access of the left common femoral vein. 3. Left lower extremity venogram. 4. Mechanical aspiration thrombectomy of the left anterior tibial vein, left popliteal vein, left femoral vein, left common femoral vein, left external iliac vein, and left common iliac vein 5. Balloon venoplasty of the left common femoral vein. 6. Intravascular  ultrasound. 7. Stent placement in the left common iliac vein with post stent deployment balloon molding. 8. Completion venogram. COMPARISON:  06/25/2021 MEDICATIONS: None. ANESTHESIA/SEDATION: Versed 3 mg IV; Fentanyl 150 mcg IV Moderate Sedation Time:  2 hours and 24 minutes The patient was continuously monitored during the procedure by the interventional radiology nurse under my direct supervision. FLUOROSCOPY TIME:  Fluoroscopy Time: 32 minutes 48 seconds (326 mGy). CONTRAST:  175 mL Omnipaque 300, intravenous COMPLICATIONS: None immediate. TECHNIQUE: Informed written consent was obtained from the patient after a thorough discussion of the procedural risks, benefits and alternatives. All questions were addressed. Maximal Sterile Barrier Technique was utilized including caps, mask, sterile gowns, sterile gloves, sterile drape, hand hygiene and skin antiseptic. A timeout was performed prior to the initiation of the procedure. The patient was positioned supine on the IR table. The right groin and left lower extremity were prepped and draped in standard fashion. Preprocedure ultrasound evaluation of the right common femoral and right greater saphenous vein demonstrated patency and compressibility. Access to the central right greater saphenous vein was planned. Subdermal Local anesthesia was provided with 1% lidocaine. A small skin nick was made. Under direct ultrasound visualization, the central right greater saphenous vein was accessed with a 21 gauge micropuncture needle. An image was captured and stored in the permanent record. Over the micropuncture wire, a micropuncture  sheath was inserted. A Wholey wire was then directed to the inferior vena cava and a 5 [place], 11 cm vascular sheath was placed. Baseline activated clotting time was measured and intravenous heparin boluses were administered throughout the procedure to obtain adequate anticoagulation. A 4 French omni Flush catheter was then directed to the  peripheral aspect of the inferior vena cava. Limited inferior vena cavagram was performed which demonstrated patency and brisk antegrade flow. A Glidewire was then directed to the left common iliac vein, over which the Omni Flush catheter was exchanged for a 5 French Navicross catheter. The catheter and Glidewire were then directed to the left external iliac vein, however the common femoral vein was unable to be selected. Therefore, attention was turned to gaining antegrade access of the left lower extremity. Ultrasound evaluation of the left posterior tibial vein demonstrated patency with a single safe access point just superior to the level of the ankle. Local anesthesia was provided 1% lidocaine. Under direct ultrasound visualization, the left posterior tibial vein was attempted to be accessed, however vaso spasm occurred which prevented access. Therefore, the left common femoral vein was identified which demonstrated expansile, occlusive thrombus. Access was planned. Subdermal Local anesthesia was provided 1% lidocaine at the planned needle entry site. A small skin nick was made. Under direct ultrasound visualization, the right common femoral vein was accessed with a 21 gauge micropuncture needle. An image was captured and stored in the permanent record. A 0.018 inch Nitrex wire was then inserted which was able to easily be passed to the level of the left common iliac vein. Micropuncture sheath was inserted and a Glidewire was passed to the level of the inferior vena cava. The indwelling right GSV 5 French sheath was then exchanged for a 33 cm 9 French vascular sheath. A 6 French, 15 mm loop snare was then inserted to capture the Glidewire from the left-sided access to establish through and through access. From the right sheath, the 90 cross catheter was advanced over the Glidewire into the left common femoral vein, the wire was retracted into the left common femoral vein, thus allowing selection of the left  femoral vein. The catheter and wire were directed through the left femoral vein in into the left anterior tibial vein. Left lower extremity venogram was then performed which demonstrated near complete occlusion of the entirety of the popliteal vein, femoral vein, profundus, common femoral vein, external iliac vein, and internal iliac vein. The catheter was removed over a Wholey wire. Computer-assisted suction thrombectomy was then performed with a 7 [place] Lightning Penumbra aspiration catheter with a single pass in a retrograde fashion over the indwelling wire. The wire was then removed and thrombectomy was performed in an antegrade fashion to the level of the external iliac vein. Copious acute appearing thrombus was aspirated into the canister. Due to the larger vessel size, the 7 French catheter was then exchanged for a 12 [place] Lightning Penumbra aspiration catheter after exchange of the indwelling 9 French sheath for an 63 French sheath, and thrombectomy was performed in a retrograde fashion over the wire to the level of the common femoral vein, then the wire was removed and additional aspiration was performed in antegrade fashion to the level of the iliac confluence. Additional copious acute appearing thrombus was aspirated into the canister. The Glidewire was then advanced once more to the popliteal vein and repeat left lower extremity venogram was performed. Venogram demonstrated significantly improved patency and antegrade flow throughout the popliteal, femoral, common femoral,  and external iliac veins. There was high-grade, near obstructive stenosis in the left common iliac vein. There were several rounded filling defects in the left common femoral vein compatible with residual thrombus. Therefore, balloon maceration was performed in the left common femoral vein with a 10 mm by 4 cm Conquest balloon. Limited repeat venogram demonstrated significantly improved patency inflow through the treated segment.  Attention was then turned toward addressing the May-Thurner lesion. A 9 French vascular sheath was then inserted via the left common femoral vein access and directed to the level of the IVC. Intravascular ultrasound was performed of the left external and internal iliac veins which demonstrated severe, greater than 95% stenosis of the left common iliac vein at the level of the right common iliac artery. There was sluggish, near stagnant flow in the peripheral common iliac and left external iliac veins. Size measurements were obtained. Next, under direct fluoroscopic visualization, a 16 mm x 100 mm Medtronic Abre stent was deployed with the central portion at the level of the common iliac confluence, extending into the central external iliac vein. Post appointment balloon molding was performed with a 16 mm x 4 cm Atlas balloon. Intravascular ultrasound evaluation was again performed which demonstrated significantly improved patency of the left iliac veins with excellent wall apposition of the indwelling stent. The stent appeared well positioned with the medial aspect of the central and of the stent at the level of the iliac confluence, with minimal overhang into the inferior vena cava. Completion venogram left lower extremity was performed which demonstrated patency and brisk antegrade flow through the iliac veins and stent. The indwelling sheaths were removed and hemostasis was achieved with manual pressure. Bandages were applied. A single therapeutic dose of Lovenox was administered prior to departing the IR suite. The patient tolerated the procedure well without immediate complication. FINDINGS: Acute deep vein thrombosis extending from the calf veins to the left common iliac vein. Severe focal stenosis in the common left iliac vein. IMPRESSION: 1. Acute deep vein thrombosis extending from the left common iliac vein to the left calf veins secondary to May-Thurner lesion. 2. Successful aspiration thrombectomy from  the left anterior tibial vein to the left common iliac vein. 3. Successful placement of left common iliac to left external iliac self expanding uncovered venous stent (16 x 100 mm Abre). PLAN: 1. Therapeutic Lovenox for 1 month. 2. Left lower extremity compression stocking. 3. Follow-up in 1 month in IR clinic with CT venogram of the abdomen and pelvis, and left lower extremity venous duplex. Ruthann Cancer, MD [facility] Radiology Electronically Signed   By: Ruthann Cancer MD   On: 06/27/2021 07:55   IR US Guide Vasc Access Right  Result Date: 06/27/2021 INDICATION: 70 year old female with acute left lower extremity deep vein thrombosis extending from the calf veins to the left common iliac vein, suspicious for May-Thurner syndrome. History of recent abdominoplasty. EXAM: 1. Ultrasound-guided vascular access of the right greater saphenous vein. 2. Ultrasound-guided vascular access of the left common femoral vein. 3. Left lower extremity venogram. 4. Mechanical aspiration thrombectomy of the left anterior tibial vein, left popliteal vein, left femoral vein, left common femoral vein, left external iliac vein, and left common iliac vein 5. Balloon venoplasty of the left common femoral vein. 6. Intravascular ultrasound. 7. Stent placement in the left common iliac vein with post stent deployment balloon molding. 8. Completion venogram. COMPARISON:  06/25/2021 MEDICATIONS: None. ANESTHESIA/SEDATION: Versed 3 mg IV; Fentanyl 150 mcg IV Moderate  Sedation Time:  2 hours and 24 minutes The patient was continuously monitored during the procedure by the interventional radiology nurse under my direct supervision. FLUOROSCOPY TIME:  Fluoroscopy Time: 32 minutes 48 seconds (326 mGy). CONTRAST:  175 mL Omnipaque 300, intravenous COMPLICATIONS: None immediate. TECHNIQUE: Informed written consent was obtained from the patient after a thorough discussion of the procedural risks,  benefits and alternatives. All questions were addressed. Maximal Sterile Barrier Technique was utilized including caps, mask, sterile gowns, sterile gloves, sterile drape, hand hygiene and skin antiseptic. A timeout was performed prior to the initiation of the procedure. The patient was positioned supine on the IR table. The right groin and left lower extremity were prepped and draped in standard fashion. Preprocedure ultrasound evaluation of the right common femoral and right greater saphenous vein demonstrated patency and compressibility. Access to the central right greater saphenous vein was planned. Subdermal Local anesthesia was provided with 1% lidocaine. A small skin nick was made. Under direct ultrasound visualization, the central right greater saphenous vein was accessed with a 21 gauge micropuncture needle. An image was captured and stored in the permanent record. Over the micropuncture wire, a micropuncture sheath was inserted. A Wholey wire was then directed to the inferior vena cava and a 5 [place], 11 cm vascular sheath was placed. Baseline activated clotting time was measured and intravenous heparin boluses were administered throughout the procedure to obtain adequate anticoagulation. A 4 French omni Flush catheter was then directed to the peripheral aspect of the inferior vena cava. Limited inferior vena cavagram was performed which demonstrated patency and brisk antegrade flow. A Glidewire was then directed to the left common iliac vein, over which the Omni Flush catheter was exchanged for a 5 French Navicross catheter. The catheter and Glidewire were then directed to the left external iliac vein, however the common femoral vein was unable to be selected. Therefore, attention was turned to gaining antegrade access of the left lower extremity. Ultrasound evaluation of the left posterior tibial vein demonstrated patency with a single safe access point just superior to the level of the ankle. Local  anesthesia was provided 1% lidocaine. Under direct ultrasound visualization, the left posterior tibial vein was attempted to be accessed, however vaso spasm occurred which prevented access. Therefore, the left common femoral vein was identified which demonstrated expansile, occlusive thrombus. Access was planned. Subdermal Local anesthesia was provided 1% lidocaine at the planned needle entry site. A small skin nick was made. Under direct ultrasound visualization, the right common femoral vein was accessed with a 21 gauge micropuncture needle. An image was captured and stored in the permanent record. A 0.018 inch Nitrex wire was then inserted which was able to easily be passed to the level of the left common iliac vein. Micropuncture sheath was inserted and a Glidewire was passed to the level of the inferior vena cava. The indwelling right GSV 5 French sheath was then exchanged for a 33 cm 9 French vascular sheath. A 6 French, 15 mm loop snare was then inserted to capture the Glidewire from the left-sided access to establish through and through access. From the right sheath, the 90 cross catheter was advanced over the Glidewire into the left common femoral vein, the wire was retracted into the left common femoral vein, thus allowing selection of the left femoral vein. The catheter and wire were directed through the left femoral vein in into the left anterior tibial vein. Left lower extremity venogram was then performed which demonstrated near complete occlusion of  the entirety of the popliteal vein, femoral vein, profundus, common femoral vein, external iliac vein, and internal iliac vein. The catheter was removed over a Wholey wire. Computer-assisted suction thrombectomy was then performed with a 7 [place] Lightning Penumbra aspiration catheter with a single pass in a retrograde fashion over the indwelling wire. The wire was then removed and thrombectomy was performed in an antegrade fashion to the level of the  external iliac vein. Copious acute appearing thrombus was aspirated into the canister. Due to the larger vessel size, the 7 French catheter was then exchanged for a 12 [place] Lightning Penumbra aspiration catheter after exchange of the indwelling 9 French sheath for an 76 French sheath, and thrombectomy was performed in a retrograde fashion over the wire to the level of the common femoral vein, then the wire was removed and additional aspiration was performed in antegrade fashion to the level of the iliac confluence. Additional copious acute appearing thrombus was aspirated into the canister. The Glidewire was then advanced once more to the popliteal vein and repeat left lower extremity venogram was performed. Venogram demonstrated significantly improved patency and antegrade flow throughout the popliteal, femoral, common femoral, and external iliac veins. There was high-grade, near obstructive stenosis in the left common iliac vein. There were several rounded filling defects in the left common femoral vein compatible with residual thrombus. Therefore, balloon maceration was performed in the left common femoral vein with a 10 mm by 4 cm Conquest balloon. Limited repeat venogram demonstrated significantly improved patency inflow through the treated segment. Attention was then turned toward addressing the May-Thurner lesion. A 9 French vascular sheath was then inserted via the left common femoral vein access and directed to the level of the IVC. Intravascular ultrasound was performed of the left external and internal iliac veins which demonstrated severe, greater than 95% stenosis of the left common iliac vein at the level of the right common iliac artery. There was sluggish, near stagnant flow in the peripheral common iliac and left external iliac veins. Size measurements were obtained. Next, under direct fluoroscopic visualization, a 16 mm x 100 mm Medtronic Abre stent was deployed with the central portion at the  level of the common iliac confluence, extending into the central external iliac vein. Post appointment balloon molding was performed with a 16 mm x 4 cm Atlas balloon. Intravascular ultrasound evaluation was again performed which demonstrated significantly improved patency of the left iliac veins with excellent wall apposition of the indwelling stent. The stent appeared well positioned with the medial aspect of the central and of the stent at the level of the iliac confluence, with minimal overhang into the inferior vena cava. Completion venogram left lower extremity was performed which demonstrated patency and brisk antegrade flow through the iliac veins and stent. The indwelling sheaths were removed and hemostasis was achieved with manual pressure. Bandages were applied. A single therapeutic dose of Lovenox was administered prior to departing the IR suite. The patient tolerated the procedure well without immediate complication. FINDINGS: Acute deep vein thrombosis extending from the calf veins to the left common iliac vein. Severe focal stenosis in the common left iliac vein. IMPRESSION: 1. Acute deep vein thrombosis extending from the left common iliac vein to the left calf veins secondary to May-Thurner lesion. 2. Successful aspiration thrombectomy from the left anterior tibial vein to the left common iliac vein. 3. Successful placement of left common iliac to left external iliac self expanding uncovered venous stent (16 x 100 mm Abre). PLAN: 1.  Therapeutic Lovenox for 1 month. 2. Left lower extremity compression stocking. 3. Follow-up in 1 month in IR clinic with CT venogram of the abdomen and pelvis, and left lower extremity venous duplex. Ruthann Cancer, MD [facility] Radiology Electronically Signed   By: Ruthann Cancer MD   On: 06/27/2021 07:55   IR INTRAVASCULAR ULTRASOUND NON CORONARY  Result Date: 06/27/2021 INDICATION: 70 year old female with acute left  lower extremity deep vein thrombosis extending from the calf veins to the left common iliac vein, suspicious for May-Thurner syndrome. History of recent abdominoplasty. EXAM: 1. Ultrasound-guided vascular access of the right greater saphenous vein. 2. Ultrasound-guided vascular access of the left common femoral vein. 3. Left lower extremity venogram. 4. Mechanical aspiration thrombectomy of the left anterior tibial vein, left popliteal vein, left femoral vein, left common femoral vein, left external iliac vein, and left common iliac vein 5. Balloon venoplasty of the left common femoral vein. 6. Intravascular ultrasound. 7. Stent placement in the left common iliac vein with post stent deployment balloon molding. 8. Completion venogram. COMPARISON:  06/25/2021 MEDICATIONS: None. ANESTHESIA/SEDATION: Versed 3 mg IV; Fentanyl 150 mcg IV Moderate Sedation Time:  2 hours and 24 minutes The patient was continuously monitored during the procedure by the interventional radiology nurse under my direct supervision. FLUOROSCOPY TIME:  Fluoroscopy Time: 32 minutes 48 seconds (326 mGy). CONTRAST:  175 mL Omnipaque 300, intravenous COMPLICATIONS: None immediate. TECHNIQUE: Informed written consent was obtained from the patient after a thorough discussion of the procedural risks, benefits and alternatives. All questions were addressed. Maximal Sterile Barrier Technique was utilized including caps, mask, sterile gowns, sterile gloves, sterile drape, hand hygiene and skin antiseptic. A timeout was performed prior to the initiation of the procedure. The patient was positioned supine on the IR table. The right groin and left lower extremity were prepped and draped in standard fashion. Preprocedure ultrasound evaluation of the right common femoral and right greater saphenous vein demonstrated patency and compressibility. Access to the central right greater saphenous vein was planned. Subdermal Local anesthesia was provided with 1%  lidocaine. A small skin nick was made. Under direct ultrasound visualization, the central right greater saphenous vein was accessed with a 21 gauge micropuncture needle. An image was captured and stored in the permanent record. Over the micropuncture wire, a micropuncture sheath was inserted. A Wholey wire was then directed to the inferior vena cava and a 5 [place], 11 cm vascular sheath was placed. Baseline activated clotting time was measured and intravenous heparin boluses were administered throughout the procedure to obtain adequate anticoagulation. A 4 French omni Flush catheter was then directed to the peripheral aspect of the inferior vena cava. Limited inferior vena cavagram was performed which demonstrated patency and brisk antegrade flow. A Glidewire was then directed to the left common iliac vein, over which the Omni Flush catheter was exchanged for a 5 French Navicross catheter. The catheter and Glidewire were then directed to the left external iliac vein, however the common femoral vein was unable to be selected. Therefore, attention was turned to gaining antegrade access of the left lower extremity. Ultrasound evaluation of the left posterior tibial vein demonstrated patency with a single safe access point just superior to the level of the ankle. Local anesthesia was provided 1% lidocaine. Under direct ultrasound visualization, the left posterior tibial vein was attempted to be accessed, however vaso spasm occurred which prevented access. Therefore, the left common femoral vein was identified which demonstrated expansile, occlusive thrombus. Access was  planned. Subdermal Local anesthesia was provided 1% lidocaine at the planned needle entry site. A small skin nick was made. Under direct ultrasound visualization, the right common femoral vein was accessed with a 21 gauge micropuncture needle. An image was captured and stored in the permanent record. A 0.018 inch Nitrex wire was then inserted which was  able to easily be passed to the level of the left common iliac vein. Micropuncture sheath was inserted and a Glidewire was passed to the level of the inferior vena cava. The indwelling right GSV 5 French sheath was then exchanged for a 33 cm 9 French vascular sheath. A 6 French, 15 mm loop snare was then inserted to capture the Glidewire from the left-sided access to establish through and through access. From the right sheath, the 90 cross catheter was advanced over the Glidewire into the left common femoral vein, the wire was retracted into the left common femoral vein, thus allowing selection of the left femoral vein. The catheter and wire were directed through the left femoral vein in into the left anterior tibial vein. Left lower extremity venogram was then performed which demonstrated near complete occlusion of the entirety of the popliteal vein, femoral vein, profundus, common femoral vein, external iliac vein, and internal iliac vein. The catheter was removed over a Wholey wire. Computer-assisted suction thrombectomy was then performed with a 7 [place] Lightning Penumbra aspiration catheter with a single pass in a retrograde fashion over the indwelling wire. The wire was then removed and thrombectomy was performed in an antegrade fashion to the level of the external iliac vein. Copious acute appearing thrombus was aspirated into the canister. Due to the larger vessel size, the 7 French catheter was then exchanged for a 12 [place] Lightning Penumbra aspiration catheter after exchange of the indwelling 9 French sheath for an 3 French sheath, and thrombectomy was performed in a retrograde fashion over the wire to the level of the common femoral vein, then the wire was removed and additional aspiration was performed in antegrade fashion to the level of the iliac confluence. Additional copious acute appearing thrombus was aspirated into the canister. The Glidewire was then advanced once more to the popliteal vein  and repeat left lower extremity venogram was performed. Venogram demonstrated significantly improved patency and antegrade flow throughout the popliteal, femoral, common femoral, and external iliac veins. There was high-grade, near obstructive stenosis in the left common iliac vein. There were several rounded filling defects in the left common femoral vein compatible with residual thrombus. Therefore, balloon maceration was performed in the left common femoral vein with a 10 mm by 4 cm Conquest balloon. Limited repeat venogram demonstrated significantly improved patency inflow through the treated segment. Attention was then turned toward addressing the May-Thurner lesion. A 9 French vascular sheath was then inserted via the left common femoral vein access and directed to the level of the IVC. Intravascular ultrasound was performed of the left external and internal iliac veins which demonstrated severe, greater than 95% stenosis of the left common iliac vein at the level of the right common iliac artery. There was sluggish, near stagnant flow in the peripheral common iliac and left external iliac veins. Size measurements were obtained. Next, under direct fluoroscopic visualization, a 16 mm x 100 mm Medtronic Abre stent was deployed with the central portion at the level of the common iliac confluence, extending into the central external iliac vein. Post appointment balloon molding was performed with a 16 mm x 4 cm Atlas balloon. Intravascular  ultrasound evaluation was again performed which demonstrated significantly improved patency of the left iliac veins with excellent wall apposition of the indwelling stent. The stent appeared well positioned with the medial aspect of the central and of the stent at the level of the iliac confluence, with minimal overhang into the inferior vena cava. Completion venogram left lower extremity was performed which demonstrated patency and brisk antegrade flow through the iliac veins  and stent. The indwelling sheaths were removed and hemostasis was achieved with manual pressure. Bandages were applied. A single therapeutic dose of Lovenox was administered prior to departing the IR suite. The patient tolerated the procedure well without immediate complication. FINDINGS: Acute deep vein thrombosis extending from the calf veins to the left common iliac vein. Severe focal stenosis in the common left iliac vein. IMPRESSION: 1. Acute deep vein thrombosis extending from the left common iliac vein to the left calf veins secondary to May-Thurner lesion. 2. Successful aspiration thrombectomy from the left anterior tibial vein to the left common iliac vein. 3. Successful placement of left common iliac to left external iliac self expanding uncovered venous stent (16 x 100 mm Abre). PLAN: 1. Therapeutic Lovenox for 1 month. 2. Left lower extremity compression stocking. 3. Follow-up in 1 month in IR clinic with CT venogram of the abdomen and pelvis, and left lower extremity venous duplex. Ruthann Cancer, MD [facility] Radiology Electronically Signed   By: Ruthann Cancer MD   On: 06/27/2021 07:55   IR TRANSCATH PLC STENT 1ST ART NOT LE CV CAR VERT CAR  Result Date: 06/27/2021 INDICATION: 70 year old female with acute left lower extremity deep vein thrombosis extending from the calf veins to the left common iliac vein, suspicious for May-Thurner syndrome. History of recent abdominoplasty. EXAM: 1. Ultrasound-guided vascular access of the right greater saphenous vein. 2. Ultrasound-guided vascular access of the left common femoral vein. 3. Left lower extremity venogram. 4. Mechanical aspiration thrombectomy of the left anterior tibial vein, left popliteal vein, left femoral vein, left common femoral vein, left external iliac vein, and left common iliac vein 5. Balloon venoplasty of the left common femoral vein. 6. Intravascular ultrasound. 7. Stent placement in  the left common iliac vein with post stent deployment balloon molding. 8. Completion venogram. COMPARISON:  06/25/2021 MEDICATIONS: None. ANESTHESIA/SEDATION: Versed 3 mg IV; Fentanyl 150 mcg IV Moderate Sedation Time:  2 hours and 24 minutes The patient was continuously monitored during the procedure by the interventional radiology nurse under my direct supervision. FLUOROSCOPY TIME:  Fluoroscopy Time: 32 minutes 48 seconds (326 mGy). CONTRAST:  175 mL Omnipaque 300, intravenous COMPLICATIONS: None immediate. TECHNIQUE: Informed written consent was obtained from the patient after a thorough discussion of the procedural risks, benefits and alternatives. All questions were addressed. Maximal Sterile Barrier Technique was utilized including caps, mask, sterile gowns, sterile gloves, sterile drape, hand hygiene and skin antiseptic. A timeout was performed prior to the initiation of the procedure. The patient was positioned supine on the IR table. The right groin and left lower extremity were prepped and draped in standard fashion. Preprocedure ultrasound evaluation of the right common femoral and right greater saphenous vein demonstrated patency and compressibility. Access to the central right greater saphenous vein was planned. Subdermal Local anesthesia was provided with 1% lidocaine. A small skin nick was made. Under direct ultrasound visualization, the central right greater saphenous vein was accessed with a 21 gauge micropuncture needle. An image was captured and stored in the permanent record. Over the  micropuncture wire, a micropuncture sheath was inserted. A Wholey wire was then directed to the inferior vena cava and a 5 [place], 11 cm vascular sheath was placed. Baseline activated clotting time was measured and intravenous heparin boluses were administered throughout the procedure to obtain adequate anticoagulation. A 4 French omni Flush catheter was then directed to the peripheral aspect of the inferior vena  cava. Limited inferior vena cavagram was performed which demonstrated patency and brisk antegrade flow. A Glidewire was then directed to the left common iliac vein, over which the Omni Flush catheter was exchanged for a 5 French Navicross catheter. The catheter and Glidewire were then directed to the left external iliac vein, however the common femoral vein was unable to be selected. Therefore, attention was turned to gaining antegrade access of the left lower extremity. Ultrasound evaluation of the left posterior tibial vein demonstrated patency with a single safe access point just superior to the level of the ankle. Local anesthesia was provided 1% lidocaine. Under direct ultrasound visualization, the left posterior tibial vein was attempted to be accessed, however vaso spasm occurred which prevented access. Therefore, the left common femoral vein was identified which demonstrated expansile, occlusive thrombus. Access was planned. Subdermal Local anesthesia was provided 1% lidocaine at the planned needle entry site. A small skin nick was made. Under direct ultrasound visualization, the right common femoral vein was accessed with a 21 gauge micropuncture needle. An image was captured and stored in the permanent record. A 0.018 inch Nitrex wire was then inserted which was able to easily be passed to the level of the left common iliac vein. Micropuncture sheath was inserted and a Glidewire was passed to the level of the inferior vena cava. The indwelling right GSV 5 French sheath was then exchanged for a 33 cm 9 French vascular sheath. A 6 French, 15 mm loop snare was then inserted to capture the Glidewire from the left-sided access to establish through and through access. From the right sheath, the 90 cross catheter was advanced over the Glidewire into the left common femoral vein, the wire was retracted into the left common femoral vein, thus allowing selection of the left femoral vein. The catheter and wire were  directed through the left femoral vein in into the left anterior tibial vein. Left lower extremity venogram was then performed which demonstrated near complete occlusion of the entirety of the popliteal vein, femoral vein, profundus, common femoral vein, external iliac vein, and internal iliac vein. The catheter was removed over a Wholey wire. Computer-assisted suction thrombectomy was then performed with a 7 [place] Lightning Penumbra aspiration catheter with a single pass in a retrograde fashion over the indwelling wire. The wire was then removed and thrombectomy was performed in an antegrade fashion to the level of the external iliac vein. Copious acute appearing thrombus was aspirated into the canister. Due to the larger vessel size, the 7 French catheter was then exchanged for a 12 [place] Lightning Penumbra aspiration catheter after exchange of the indwelling 9 French sheath for an 52 French sheath, and thrombectomy was performed in a retrograde fashion over the wire to the level of the common femoral vein, then the wire was removed and additional aspiration was performed in antegrade fashion to the level of the iliac confluence. Additional copious acute appearing thrombus was aspirated into the canister. The Glidewire was then advanced once more to the popliteal vein and repeat left lower extremity venogram was performed. Venogram demonstrated significantly improved patency and antegrade flow throughout the  popliteal, femoral, common femoral, and external iliac veins. There was high-grade, near obstructive stenosis in the left common iliac vein. There were several rounded filling defects in the left common femoral vein compatible with residual thrombus. Therefore, balloon maceration was performed in the left common femoral vein with a 10 mm by 4 cm Conquest balloon. Limited repeat venogram demonstrated significantly improved patency inflow through the treated segment. Attention was then turned toward addressing  the May-Thurner lesion. A 9 French vascular sheath was then inserted via the left common femoral vein access and directed to the level of the IVC. Intravascular ultrasound was performed of the left external and internal iliac veins which demonstrated severe, greater than 95% stenosis of the left common iliac vein at the level of the right common iliac artery. There was sluggish, near stagnant flow in the peripheral common iliac and left external iliac veins. Size measurements were obtained. Next, under direct fluoroscopic visualization, a 16 mm x 100 mm Medtronic Abre stent was deployed with the central portion at the level of the common iliac confluence, extending into the central external iliac vein. Post appointment balloon molding was performed with a 16 mm x 4 cm Atlas balloon. Intravascular ultrasound evaluation was again performed which demonstrated significantly improved patency of the left iliac veins with excellent wall apposition of the indwelling stent. The stent appeared well positioned with the medial aspect of the central and of the stent at the level of the iliac confluence, with minimal overhang into the inferior vena cava. Completion venogram left lower extremity was performed which demonstrated patency and brisk antegrade flow through the iliac veins and stent. The indwelling sheaths were removed and hemostasis was achieved with manual pressure. Bandages were applied. A single therapeutic dose of Lovenox was administered prior to departing the IR suite. The patient tolerated the procedure well without immediate complication. FINDINGS: Acute deep vein thrombosis extending from the calf veins to the left common iliac vein. Severe focal stenosis in the common left iliac vein. IMPRESSION: 1. Acute deep vein thrombosis extending from the left common iliac vein to the left calf veins secondary to May-Thurner lesion. 2. Successful aspiration thrombectomy from the left anterior tibial vein to the left  common iliac vein. 3. Successful placement of left common iliac to left external iliac self expanding uncovered venous stent (16 x 100 mm Abre). PLAN: 1. Therapeutic Lovenox for 1 month. 2. Left lower extremity compression stocking. 3. Follow-up in 1 month in IR clinic with CT venogram of the abdomen and pelvis, and left lower extremity venous duplex. Ruthann Cancer, MD [facility] Radiology Electronically Signed   By: Ruthann Cancer MD   On: 06/27/2021 07:55        Scheduled Meds:  calcium-vitamin D  1 tablet Oral Q breakfast   cholecalciferol  2,000 Units Oral Daily   enoxaparin (LOVENOX) injection  70 mg Subcutaneous Q12H   Continuous Infusions:   LOS: 2 days    Time spent: 64mn  PDomenic Polite MD [facility]   06/27/2021, 12:02 PM

## 2021-06-27 NOTE — Care Management Important Message (Signed)
Important Message  Patient Details  Name: Christina [name] MRN: [id number] Date of Birth: [birth year]   Medicare Important Message Given:  Yes     [name] 06/27/2021, 3:52 PM

## 2021-06-27 NOTE — TOC Benefit Eligibility Note (Signed)
     Transition of Care [facility]) Benefit Eligibility Note    Patient Details  Name: Pollyann Roa MRN: [id number] Date of Birth: June 22, 1951   Medication/Dose: ENOXAPARIN 80 MG SYRINGES BID  /   LOVENOX 80 MG SYRINGES : NOT  COVER- NON-FORMULARY ,  P/A-YES #  [phone]  Covered?: Yes  Tier:  (TIER- 4 DRUG)  Prescription Coverage Preferred Pharmacy: [facility]  Spoke with Person/Company/Phone Number:: RUSSELL   @  [employer]  TU #  [phone]  Co-Pay: $100.00  Prior Approval: No  Deductible: Met (NO DEDUCTIBLE WITH PLAN   and  OUT-OF-POCKET:UNMET)  Additional Notes: ELIQUIS  5 MG BID : COVER- YES , CO-PAY- $ 47.00 , TIER- 3 DRUG  , P/A-NO    Memory Argue Phone Number: 06/27/2021, 11:20 AM

## 2021-06-28 ENCOUNTER — Other Ambulatory Visit (HOSPITAL_COMMUNITY): Payer: Self-pay

## 2021-06-28 DIAGNOSIS — I82402 Acute embolism and thrombosis of unspecified deep veins of left lower extremity: Secondary | ICD-10-CM

## 2021-06-28 LAB — CBC
HCT: 24.5 % — ABNORMAL LOW (ref 36.0–46.0)
HCT: 26.4 % — ABNORMAL LOW (ref 36.0–46.0)
Hemoglobin: 7.8 g/dL — ABNORMAL LOW (ref 12.0–15.0)
Hemoglobin: 8.4 g/dL — ABNORMAL LOW (ref 12.0–15.0)
MCH: 30.1 pg (ref 26.0–34.0)
MCH: 29.8 pg (ref 26.0–34.0)
MCHC: 31.8 g/dL (ref 30.0–36.0)
MCHC: 31.8 g/dL (ref 30.0–36.0)
MCV: 94.6 fL (ref 80.0–100.0)
MCV: 93.6 fL (ref 80.0–100.0)
Platelets: 173 10*3/uL (ref 150–400)
Platelets: 194 10*3/uL (ref 150–400)
RBC: 2.59 MIL/uL — ABNORMAL LOW (ref 3.87–5.11)
RBC: 2.82 MIL/uL — ABNORMAL LOW (ref 3.87–5.11)
RDW: 13.9 % (ref 11.5–15.5)
RDW: 13.7 % (ref 11.5–15.5)
WBC: 9.2 10*3/uL (ref 4.0–10.5)
WBC: 7.4 10*3/uL (ref 4.0–10.5)
nRBC: 0 % (ref 0.0–0.2)
nRBC: 0 % (ref 0.0–0.2)

## 2021-06-28 LAB — BASIC METABOLIC PANEL
Anion gap: 8 (ref 5–15)
BUN: 9 mg/dL (ref 8–23)
CO2: 24 mmol/L (ref 22–32)
Calcium: 8 mg/dL — ABNORMAL LOW (ref 8.9–10.3)
Chloride: 107 mmol/L (ref 98–111)
Creatinine, Ser: 0.63 mg/dL (ref 0.44–1.00)
GFR, Estimated: >60 mL/min (ref >60)
Glucose, Bld: 162 mg/dL — ABNORMAL HIGH (ref 70–99)
Potassium: 3.7 mmol/L (ref 3.5–5.1)
Sodium: 139 mmol/L (ref 135–145)

## 2021-06-28 MED ORDER — IRON POLYSACCH CMPLX-B12-FA 150-0.025-1 MG PO CAPS
150.0000 mg | ORAL_CAPSULE | Freq: Two times a day (BID) | ORAL | 0 refills | Status: AC
  Filled 2021-06-28: qty 60, 30d supply, fill #0

## 2021-06-28 MED ORDER — ENOXAPARIN (LOVENOX) PATIENT EDUCATION KIT
PACK | Freq: Once | Status: AC
  Filled 2021-06-28: qty 1

## 2021-06-28 MED ORDER — ENOXAPARIN SODIUM 80 MG/0.8ML IJ SOSY
70.0000 mg | PREFILLED_SYRINGE | Freq: Two times a day (BID) | INTRAMUSCULAR | 0 refills | Status: DC
  Filled 2021-06-28: qty 48, 30d supply, fill #0

## 2021-06-28 MED ORDER — ENOXAPARIN (LOVENOX) PATIENT EDUCATION KIT
PACK | Freq: Once | Status: AC
  Filled 2021-06-28 (×2): qty 1

## 2021-06-28 NOTE — Discharge Summary (Addendum)
Physician Discharge Summary  Season Goldtooth [id number] DOB: 1951-02-10 DOA: 06/25/2021  PCP: Kathyrn Lass, MD  Admit date: 06/25/2021 Discharge date: 06/28/2021  Time spent: 35 minutes  Recommendations for Outpatient Follow-up:  PCP in 1 week [facility] Interventional radiology in 11monthwith CTV Continue Lovenox BID for 173monthnd then switch to eliquis or xarelto to complete 40m43monthf anticoagulation   Discharge Diagnoses:  Active Problems:   DVT (deep venous thrombosis) (HCC) Extensive left leg DVT May Thurner syndrome Acute blood loss anemia History of migraines Prior history of paroxysmal atrial fibrillation Recent liposuction  Discharge Condition: Stable  Diet recommendation: Regular  Filed Weights   06/25/21 1616  Weight: 66.4 kg    History of present illness:  70 17ar old female with history of migraines, paroxysmal atrial fibrillation status post ablations x2, no longer on anticoagulation, hypertension, underwent a mini tummy tuck/liposuction 3 weeks ago, presented to the ED with new onset left groin pain and swelling in her left leg. -Upon work-up was noted to have extensive left leg DVT with significant clot burden, CTA positive for bilateral subsegmental PE  Hospital Course:   Acute extensive left leg DVT/small bilateral PE May Thurner syndrome -Treated with IV heparin on admission, interventional radiology was consulted by EDP on admission -Underwent suction thrombectomy, balloon valvuloplasty of left common femoral vein and stent placement and left common iliac vein on 8/3 -Switched over to subcu Lovenox now -Dr.Sutton(IR) recommended to continue twice daily Lovenox for at least a month, after this can be transitioned over to DOAIukar 3-40mo240monthhe was recommended to wear thigh-high compression stockings for 1 month -Ambulating independently, Lovenox teaching completed   Acute blood loss anemia -Apparently had 500 mL blood loss due to thrombectomy, hemoglobin  is 8.8 postprocedure -Repeat hemoglobin today is 8.4, she remains asymptomatic, oral iron added at discharge  History of hypertension -Blood pressure soft this admission, losartan discontinued   History of migraines -Continue home regimen, stable now   History of PAF -In sinus rhythm at this time   Recent tummy tuck/liposuction -Site appears unremarkable, follow-up with Dr. DillMarla RoeInterventional Radiology Procedure Note Dr. DylaDemetrios Loll8/3   Procedure:  1) Right greater saphenous vein and left common femoral vein access 2) Suction thrombectomy from the left common iliac to anterior tibial vein 3) Balloon venoplasty of left common femoral vein 4) Intravascular ultrasound 5) Stent placement in left common iliac vein   Findings: Please refer to procedural dictation for full description.  Extensive clot burden extending from calf veins to left common iliac vein.  Successful suction thrombectomy with 7 Fr and 12 Fr Penumbra aspiration catheters.  Venoplasty of left common femoral vein to 10 mm due to trace residual thrombus.  IVUS evaluation confirmed high grade stenosis secondary to May Thurner lesion.  Successful placement of 16 mm x 100 mm Abre stent.   Complications: None immediate   Estimated Blood Loss: 500 mL    Discharge Exam: Vitals:   06/28/21 0704 06/28/21 1117  BP: 108/61 (!) 128/44  Pulse: 98 (!) 106  Resp: 17 (!) 21  Temp: 98.9 F (37.2 C) 98.7 F (37.1 C)  SpO2: 97% 97%    General: AAOx3, no distress Cardiovascular: S1-S2, regular rate rhythm Respiratory: Clear  Discharge Instructions   Discharge Instructions     Diet - low sodium heart healthy   Complete by: As directed    Discharge wound care:   Complete by: As directed    routine   Increase activity  slowly   Complete by: As directed       Allergies as of 06/28/2021       Reactions   Oxycontin [oxycodone Hcl] Nausea Only   Topamax [topiramate]    Intermittent blurry vision,  numbness/tingling, gi upset   Metoprolol Nausea Only   Nortriptyline Palpitations   Propranolol Nausea Only        Medication List     STOP taking these medications    losartan 25 MG tablet Commonly known as: COZAAR       TAKE these medications    acetaminophen 500 MG tablet Commonly known as: TYLENOL Take 1,000 mg by mouth every 8 (eight) hours as needed for mild pain or headache.   amoxicillin 500 MG tablet Commonly known as: AMOXIL Take 2,000 mg by mouth See admin instructions. Take 2000 mg by mouth 1 hour prior to dental procedure   Calcium Carb-Cholecalciferol 600-800 MG-UNIT Tabs Take 1 tablet by mouth daily.   conjugated estrogens vaginal cream Commonly known as: PREMARIN Place 0.5 g vaginally as directed. Every 2 weeks   Emgality 120 MG/ML Soaj Generic drug: Galcanezumab-gnlm Inject 120 mg into the skin every 30 (thirty) days.   enoxaparin 80 MG/0.8ML injection Commonly known as: LOVENOX Inject 0.7 mLs (70 mg total) into the skin every 12 (twelve) hours.   HYDROcodone-acetaminophen 5-325 MG tablet Commonly known as: NORCO/VICODIN Take 1 tablet by mouth every 6 (six) hours as needed for moderate pain.   ibandronate 150 MG tablet Commonly known as: BONIVA Take 1 tablet by mouth every 30 (thirty) days.   Iron Polysacch Cmplx-B12-FA 150-0.025-1 MG Caps Take 150 mg by mouth 2 (two) times daily with a meal.   meclizine 12.5 MG tablet Commonly known as: ANTIVERT Take 12.5 mg by mouth 3 (three) times daily as needed for dizziness.   naratriptan 2.5 MG tablet Commonly known as: AMERGE Take 1 tablet (2.5 mg total) by mouth as needed for migraine. Take one (1) tablet at onset of headache; if returns or does not resolve, may repeat after 4 hours; do not exceed five (5) mg in 24 hours.   Nurtec 75 MG Tbdp Generic drug: Rimegepant Sulfate Take 75 mg by mouth daily as needed (take for abortive therapy of migraine, no more than 1 tablet in 24 hours or 10 per  month).   ondansetron 4 MG tablet Commonly known as: Zofran Take 1 tablet (4 mg total) by mouth every 8 (eight) hours as needed for nausea or vomiting.   PRESERVISION AREDS 2 PO Take 1 capsule by mouth 2 (two) times daily.   promethazine 25 MG tablet Commonly known as: PHENERGAN TAKE 1 TABLET (25 MG TOTAL) BY MOUTH EVERY 8 (EIGHT) HOURS AS NEEDED FOR NAUSEA.   Vitamin D 50 MCG (2000 UT) Caps Take 2,000 Units by mouth daily.               Discharge Care Instructions  (From admission, onward)           Start     Ordered   06/28/21 0000  Discharge wound care:       Comments: routine   06/28/21 1220           Allergies  Allergen Reactions   Oxycontin [Oxycodone Hcl] Nausea Only   Topamax [Topiramate]     Intermittent blurry vision, numbness/tingling, gi upset   Metoprolol Nausea Only   Nortriptyline Palpitations   Propranolol Nausea Only    Follow-up Information     Kathyrn Lass,  MD. Schedule an appointment as soon as possible for a visit in 1 week(s).   Specialty: Family Medicine Contact information: [place] [address] [place]. Go in 1 month(s).   Specialty: Radiology Contact information: [address] [id number] [place] [phone]                 The results of significant diagnostics from this hospitalization (including imaging, microbiology, ancillary and laboratory) are listed below for reference.    Significant Diagnostic Studies: CT Angio Chest Pulmonary Embolism (PE) W or WO Contrast  Result Date: 06/25/2021 CLINICAL DATA:  70 year old female with left iliofemoral deep vein thrombosis, possible pulmonary embolism on abdomen pelvis CT venogram. EXAM: CT ANGIOGRAPHY CHEST WITH CONTRAST TECHNIQUE: Multidetector CT imaging of the chest was performed using the standard protocol during bolus administration  of intravenous contrast. Multiplanar CT image reconstructions and MIPs were obtained to evaluate the vascular anatomy. CONTRAST:  Seventy-five mL Omnipaque 350, intravenous COMPARISON:  06/25/2021, 02/08/2018 FINDINGS: Cardiovascular: Satisfactory opacification of the pulmonary arteries to the segmental level. Non occlusive subsegmental pulmonary embolism in the posterior left lower lobe. Nonocclusive distal lobar/proximal segmental pulmonary embolism in the right upper lobe. Right to left ventricular ratio is less than 0.9. Normal heart size. No pericardial effusion. Mediastinum/Nodes: No enlarged mediastinal, hilar, or axillary lymph nodes. Thyroid gland, trachea, and esophagus demonstrate no significant findings. Lungs/Pleura: Minimal bibasilar subsegmental atelectasis. No focal consolidations. No pleural effusion or pneumothorax. Upper Abdomen: The visualized upper abdomen is within normal limits. Musculoskeletal: No chest wall abnormality. No acute or significant osseous findings. Review of the MIP images confirms the above findings. IMPRESSION: Vascular: Small burden of right upper lobar/segmental and left lower lobe posterior segmental acute pulmonary emboli. No evidence of right heart strain. Non-Vascular: Minimal bibasilar subsegmental atelectasis. These results were called by telephone at the time of interpretation on 06/25/2021 at 1:41 pm to provider [facility] , who verbally acknowledged these results. Ruthann Cancer, MD [facility] Radiology Electronically Signed   By: Ruthann Cancer MD   On: 06/25/2021 13:42   IR Veno/Ext/Uni Left  Result Date: 06/27/2021 INDICATION: 70 year old female with acute left lower extremity deep vein thrombosis extending from the calf veins to the left common iliac vein, suspicious for May-Thurner syndrome. History of recent abdominoplasty. EXAM: 1. Ultrasound-guided vascular access of the right greater saphenous vein. 2.  Ultrasound-guided vascular access of the left common femoral vein. 3. Left lower extremity venogram. 4. Mechanical aspiration thrombectomy of the left anterior tibial vein, left popliteal vein, left femoral vein, left common femoral vein, left external iliac vein, and left common iliac vein 5. Balloon venoplasty of the left common femoral vein. 6. Intravascular ultrasound. 7. Stent placement in the left common iliac vein with post stent deployment balloon molding. 8. Completion venogram. COMPARISON:  06/25/2021 MEDICATIONS: None. ANESTHESIA/SEDATION: Versed 3 mg IV; Fentanyl 150 mcg IV Moderate Sedation Time:  2 hours and 24 minutes The patient was continuously monitored during the procedure by the interventional radiology nurse under my direct supervision. FLUOROSCOPY TIME:  Fluoroscopy Time: 32 minutes 48 seconds (326 mGy). CONTRAST:  175 mL Omnipaque 300, intravenous COMPLICATIONS: None immediate. TECHNIQUE: Informed written consent was obtained from the patient after a thorough discussion of the procedural risks, benefits and alternatives. All questions were addressed. Maximal Sterile Barrier Technique was utilized including caps, mask, sterile gowns, sterile gloves, sterile drape,  hand hygiene and skin antiseptic. A timeout was performed prior to the initiation of the procedure. The patient was positioned supine on the IR table. The right groin and left lower extremity were prepped and draped in standard fashion. Preprocedure ultrasound evaluation of the right common femoral and right greater saphenous vein demonstrated patency and compressibility. Access to the central right greater saphenous vein was planned. Subdermal Local anesthesia was provided with 1% lidocaine. A small skin nick was made. Under direct ultrasound visualization, the central right greater saphenous vein was accessed with a 21 gauge micropuncture needle. An image was captured and stored in the permanent record. Over the micropuncture wire,  a micropuncture sheath was inserted. A Wholey wire was then directed to the inferior vena cava and a 5 [place], 11 cm vascular sheath was placed. Baseline activated clotting time was measured and intravenous heparin boluses were administered throughout the procedure to obtain adequate anticoagulation. A 4 French omni Flush catheter was then directed to the peripheral aspect of the inferior vena cava. Limited inferior vena cavagram was performed which demonstrated patency and brisk antegrade flow. A Glidewire was then directed to the left common iliac vein, over which the Omni Flush catheter was exchanged for a 5 French Navicross catheter. The catheter and Glidewire were then directed to the left external iliac vein, however the common femoral vein was unable to be selected. Therefore, attention was turned to gaining antegrade access of the left lower extremity. Ultrasound evaluation of the left posterior tibial vein demonstrated patency with a single safe access point just superior to the level of the ankle. Local anesthesia was provided 1% lidocaine. Under direct ultrasound visualization, the left posterior tibial vein was attempted to be accessed, however vaso spasm occurred which prevented access. Therefore, the left common femoral vein was identified which demonstrated expansile, occlusive thrombus. Access was planned. Subdermal Local anesthesia was provided 1% lidocaine at the planned needle entry site. A small skin nick was made. Under direct ultrasound visualization, the right common femoral vein was accessed with a 21 gauge micropuncture needle. An image was captured and stored in the permanent record. A 0.018 inch Nitrex wire was then inserted which was able to easily be passed to the level of the left common iliac vein. Micropuncture sheath was inserted and a Glidewire was passed to the level of the inferior vena cava. The indwelling right GSV 5 French sheath was then exchanged for a 33 cm 9 French vascular  sheath. A 6 French, 15 mm loop snare was then inserted to capture the Glidewire from the left-sided access to establish through and through access. From the right sheath, the 90 cross catheter was advanced over the Glidewire into the left common femoral vein, the wire was retracted into the left common femoral vein, thus allowing selection of the left femoral vein. The catheter and wire were directed through the left femoral vein in into the left anterior tibial vein. Left lower extremity venogram was then performed which demonstrated near complete occlusion of the entirety of the popliteal vein, femoral vein, profundus, common femoral vein, external iliac vein, and internal iliac vein. The catheter was removed over a Wholey wire. Computer-assisted suction thrombectomy was then performed with a 7 [place] Lightning Penumbra aspiration catheter with a single pass in a retrograde fashion over the indwelling wire. The wire was then removed and thrombectomy was performed in an antegrade fashion to the level of the external iliac vein. Copious acute appearing thrombus was aspirated into the canister. Due to  the larger vessel size, the 7 French catheter was then exchanged for a 12 [place] Lightning Penumbra aspiration catheter after exchange of the indwelling 9 French sheath for an 47 French sheath, and thrombectomy was performed in a retrograde fashion over the wire to the level of the common femoral vein, then the wire was removed and additional aspiration was performed in antegrade fashion to the level of the iliac confluence. Additional copious acute appearing thrombus was aspirated into the canister. The Glidewire was then advanced once more to the popliteal vein and repeat left lower extremity venogram was performed. Venogram demonstrated significantly improved patency and antegrade flow throughout the popliteal, femoral, common femoral, and external iliac veins. There was high-grade, near obstructive stenosis in the  left common iliac vein. There were several rounded filling defects in the left common femoral vein compatible with residual thrombus. Therefore, balloon maceration was performed in the left common femoral vein with a 10 mm by 4 cm Conquest balloon. Limited repeat venogram demonstrated significantly improved patency inflow through the treated segment. Attention was then turned toward addressing the May-Thurner lesion. A 9 French vascular sheath was then inserted via the left common femoral vein access and directed to the level of the IVC. Intravascular ultrasound was performed of the left external and internal iliac veins which demonstrated severe, greater than 95% stenosis of the left common iliac vein at the level of the right common iliac artery. There was sluggish, near stagnant flow in the peripheral common iliac and left external iliac veins. Size measurements were obtained. Next, under direct fluoroscopic visualization, a 16 mm x 100 mm Medtronic Abre stent was deployed with the central portion at the level of the common iliac confluence, extending into the central external iliac vein. Post appointment balloon molding was performed with a 16 mm x 4 cm Atlas balloon. Intravascular ultrasound evaluation was again performed which demonstrated significantly improved patency of the left iliac veins with excellent wall apposition of the indwelling stent. The stent appeared well positioned with the medial aspect of the central and of the stent at the level of the iliac confluence, with minimal overhang into the inferior vena cava. Completion venogram left lower extremity was performed which demonstrated patency and brisk antegrade flow through the iliac veins and stent. The indwelling sheaths were removed and hemostasis was achieved with manual pressure. Bandages were applied. A single therapeutic dose of Lovenox was administered prior to departing the IR suite. The patient tolerated the procedure well without  immediate complication. FINDINGS: Acute deep vein thrombosis extending from the calf veins to the left common iliac vein. Severe focal stenosis in the common left iliac vein. IMPRESSION: 1. Acute deep vein thrombosis extending from the left common iliac vein to the left calf veins secondary to May-Thurner lesion. 2. Successful aspiration thrombectomy from the left anterior tibial vein to the left common iliac vein. 3. Successful placement of left common iliac to left external iliac self expanding uncovered venous stent (16 x 100 mm Abre). PLAN: 1. Therapeutic Lovenox for 1 month. 2. Left lower extremity compression stocking. 3. Follow-up in 1 month in IR clinic with CT venogram of the abdomen and pelvis, and left lower extremity venous duplex. Ruthann Cancer, MD [facility] Radiology Electronically Signed   By: Ruthann Cancer MD   On: 06/27/2021 07:55   IR Zachery Dakins [facility] MOD SED  Result Date: 06/27/2021 INDICATION: 70 year old female with acute left lower extremity deep vein thrombosis extending from the calf veins  to the left common iliac vein, suspicious for May-Thurner syndrome. History of recent abdominoplasty. EXAM: 1. Ultrasound-guided vascular access of the right greater saphenous vein. 2. Ultrasound-guided vascular access of the left common femoral vein. 3. Left lower extremity venogram. 4. Mechanical aspiration thrombectomy of the left anterior tibial vein, left popliteal vein, left femoral vein, left common femoral vein, left external iliac vein, and left common iliac vein 5. Balloon venoplasty of the left common femoral vein. 6. Intravascular ultrasound. 7. Stent placement in the left common iliac vein with post stent deployment balloon molding. 8. Completion venogram. COMPARISON:  06/25/2021 MEDICATIONS: None. ANESTHESIA/SEDATION: Versed 3 mg IV; Fentanyl 150 mcg IV Moderate Sedation Time:  2 hours and 24 minutes The patient was continuously monitored  during the procedure by the interventional radiology nurse under my direct supervision. FLUOROSCOPY TIME:  Fluoroscopy Time: 32 minutes 48 seconds (326 mGy). CONTRAST:  175 mL Omnipaque 300, intravenous COMPLICATIONS: None immediate. TECHNIQUE: Informed written consent was obtained from the patient after a thorough discussion of the procedural risks, benefits and alternatives. All questions were addressed. Maximal Sterile Barrier Technique was utilized including caps, mask, sterile gowns, sterile gloves, sterile drape, hand hygiene and skin antiseptic. A timeout was performed prior to the initiation of the procedure. The patient was positioned supine on the IR table. The right groin and left lower extremity were prepped and draped in standard fashion. Preprocedure ultrasound evaluation of the right common femoral and right greater saphenous vein demonstrated patency and compressibility. Access to the central right greater saphenous vein was planned. Subdermal Local anesthesia was provided with 1% lidocaine. A small skin nick was made. Under direct ultrasound visualization, the central right greater saphenous vein was accessed with a 21 gauge micropuncture needle. An image was captured and stored in the permanent record. Over the micropuncture wire, a micropuncture sheath was inserted. A Wholey wire was then directed to the inferior vena cava and a 5 [place], 11 cm vascular sheath was placed. Baseline activated clotting time was measured and intravenous heparin boluses were administered throughout the procedure to obtain adequate anticoagulation. A 4 French omni Flush catheter was then directed to the peripheral aspect of the inferior vena cava. Limited inferior vena cavagram was performed which demonstrated patency and brisk antegrade flow. A Glidewire was then directed to the left common iliac vein, over which the Omni Flush catheter was exchanged for a 5 French Navicross catheter. The catheter and Glidewire were  then directed to the left external iliac vein, however the common femoral vein was unable to be selected. Therefore, attention was turned to gaining antegrade access of the left lower extremity. Ultrasound evaluation of the left posterior tibial vein demonstrated patency with a single safe access point just superior to the level of the ankle. Local anesthesia was provided 1% lidocaine. Under direct ultrasound visualization, the left posterior tibial vein was attempted to be accessed, however vaso spasm occurred which prevented access. Therefore, the left common femoral vein was identified which demonstrated expansile, occlusive thrombus. Access was planned. Subdermal Local anesthesia was provided 1% lidocaine at the planned needle entry site. A small skin nick was made. Under direct ultrasound visualization, the right common femoral vein was accessed with a 21 gauge micropuncture needle. An image was captured and stored in the permanent record. A 0.018 inch Nitrex wire was then inserted which was able to easily be passed to the level of the left common iliac vein. Micropuncture sheath was inserted and a Glidewire was passed to the level of  the inferior vena cava. The indwelling right GSV 5 French sheath was then exchanged for a 33 cm 9 French vascular sheath. A 6 French, 15 mm loop snare was then inserted to capture the Glidewire from the left-sided access to establish through and through access. From the right sheath, the 90 cross catheter was advanced over the Glidewire into the left common femoral vein, the wire was retracted into the left common femoral vein, thus allowing selection of the left femoral vein. The catheter and wire were directed through the left femoral vein in into the left anterior tibial vein. Left lower extremity venogram was then performed which demonstrated near complete occlusion of the entirety of the popliteal vein, femoral vein, profundus, common femoral vein, external iliac vein, and  internal iliac vein. The catheter was removed over a Wholey wire. Computer-assisted suction thrombectomy was then performed with a 7 [place] Lightning Penumbra aspiration catheter with a single pass in a retrograde fashion over the indwelling wire. The wire was then removed and thrombectomy was performed in an antegrade fashion to the level of the external iliac vein. Copious acute appearing thrombus was aspirated into the canister. Due to the larger vessel size, the 7 French catheter was then exchanged for a 12 [place] Lightning Penumbra aspiration catheter after exchange of the indwelling 9 French sheath for an 27 French sheath, and thrombectomy was performed in a retrograde fashion over the wire to the level of the common femoral vein, then the wire was removed and additional aspiration was performed in antegrade fashion to the level of the iliac confluence. Additional copious acute appearing thrombus was aspirated into the canister. The Glidewire was then advanced once more to the popliteal vein and repeat left lower extremity venogram was performed. Venogram demonstrated significantly improved patency and antegrade flow throughout the popliteal, femoral, common femoral, and external iliac veins. There was high-grade, near obstructive stenosis in the left common iliac vein. There were several rounded filling defects in the left common femoral vein compatible with residual thrombus. Therefore, balloon maceration was performed in the left common femoral vein with a 10 mm by 4 cm Conquest balloon. Limited repeat venogram demonstrated significantly improved patency inflow through the treated segment. Attention was then turned toward addressing the May-Thurner lesion. A 9 French vascular sheath was then inserted via the left common femoral vein access and directed to the level of the IVC. Intravascular ultrasound was performed of the left external and internal iliac veins which demonstrated severe, greater than 95%  stenosis of the left common iliac vein at the level of the right common iliac artery. There was sluggish, near stagnant flow in the peripheral common iliac and left external iliac veins. Size measurements were obtained. Next, under direct fluoroscopic visualization, a 16 mm x 100 mm Medtronic Abre stent was deployed with the central portion at the level of the common iliac confluence, extending into the central external iliac vein. Post appointment balloon molding was performed with a 16 mm x 4 cm Atlas balloon. Intravascular ultrasound evaluation was again performed which demonstrated significantly improved patency of the left iliac veins with excellent wall apposition of the indwelling stent. The stent appeared well positioned with the medial aspect of the central and of the stent at the level of the iliac confluence, with minimal overhang into the inferior vena cava. Completion venogram left lower extremity was performed which demonstrated patency and brisk antegrade flow through the iliac veins and stent. The indwelling sheaths were removed and hemostasis was achieved with  manual pressure. Bandages were applied. A single therapeutic dose of Lovenox was administered prior to departing the IR suite. The patient tolerated the procedure well without immediate complication. FINDINGS: Acute deep vein thrombosis extending from the calf veins to the left common iliac vein. Severe focal stenosis in the common left iliac vein. IMPRESSION: 1. Acute deep vein thrombosis extending from the left common iliac vein to the left calf veins secondary to May-Thurner lesion. 2. Successful aspiration thrombectomy from the left anterior tibial vein to the left common iliac vein. 3. Successful placement of left common iliac to left external iliac self expanding uncovered venous stent (16 x 100 mm Abre). PLAN: 1. Therapeutic Lovenox for 1 month. 2. Left lower extremity compression stocking. 3. Follow-up in 1 month in IR clinic with CT  venogram of the abdomen and pelvis, and left lower extremity venous duplex. Ruthann Cancer, MD [facility] Radiology Electronically Signed   By: Ruthann Cancer MD   On: 06/27/2021 07:55   IR US Guide Vasc Access Left  Result Date: 06/27/2021 INDICATION: 70 year old female with acute left lower extremity deep vein thrombosis extending from the calf veins to the left common iliac vein, suspicious for May-Thurner syndrome. History of recent abdominoplasty. EXAM: 1. Ultrasound-guided vascular access of the right greater saphenous vein. 2. Ultrasound-guided vascular access of the left common femoral vein. 3. Left lower extremity venogram. 4. Mechanical aspiration thrombectomy of the left anterior tibial vein, left popliteal vein, left femoral vein, left common femoral vein, left external iliac vein, and left common iliac vein 5. Balloon venoplasty of the left common femoral vein. 6. Intravascular ultrasound. 7. Stent placement in the left common iliac vein with post stent deployment balloon molding. 8. Completion venogram. COMPARISON:  06/25/2021 MEDICATIONS: None. ANESTHESIA/SEDATION: Versed 3 mg IV; Fentanyl 150 mcg IV Moderate Sedation Time:  2 hours and 24 minutes The patient was continuously monitored during the procedure by the interventional radiology nurse under my direct supervision. FLUOROSCOPY TIME:  Fluoroscopy Time: 32 minutes 48 seconds (326 mGy). CONTRAST:  175 mL Omnipaque 300, intravenous COMPLICATIONS: None immediate. TECHNIQUE: Informed written consent was obtained from the patient after a thorough discussion of the procedural risks, benefits and alternatives. All questions were addressed. Maximal Sterile Barrier Technique was utilized including caps, mask, sterile gowns, sterile gloves, sterile drape, hand hygiene and skin antiseptic. A timeout was performed prior to the initiation of the procedure. The patient was positioned supine on the IR table. The  right groin and left lower extremity were prepped and draped in standard fashion. Preprocedure ultrasound evaluation of the right common femoral and right greater saphenous vein demonstrated patency and compressibility. Access to the central right greater saphenous vein was planned. Subdermal Local anesthesia was provided with 1% lidocaine. A small skin nick was made. Under direct ultrasound visualization, the central right greater saphenous vein was accessed with a 21 gauge micropuncture needle. An image was captured and stored in the permanent record. Over the micropuncture wire, a micropuncture sheath was inserted. A Wholey wire was then directed to the inferior vena cava and a 5 [place], 11 cm vascular sheath was placed. Baseline activated clotting time was measured and intravenous heparin boluses were administered throughout the procedure to obtain adequate anticoagulation. A 4 French omni Flush catheter was then directed to the peripheral aspect of the inferior vena cava. Limited inferior vena cavagram was performed which demonstrated patency and brisk antegrade flow. A Glidewire was then directed to the left common iliac vein, over  which the Omni Flush catheter was exchanged for a 5 French Navicross catheter. The catheter and Glidewire were then directed to the left external iliac vein, however the common femoral vein was unable to be selected. Therefore, attention was turned to gaining antegrade access of the left lower extremity. Ultrasound evaluation of the left posterior tibial vein demonstrated patency with a single safe access point just superior to the level of the ankle. Local anesthesia was provided 1% lidocaine. Under direct ultrasound visualization, the left posterior tibial vein was attempted to be accessed, however vaso spasm occurred which prevented access. Therefore, the left common femoral vein was identified which demonstrated expansile, occlusive thrombus. Access was planned. Subdermal Local  anesthesia was provided 1% lidocaine at the planned needle entry site. A small skin nick was made. Under direct ultrasound visualization, the right common femoral vein was accessed with a 21 gauge micropuncture needle. An image was captured and stored in the permanent record. A 0.018 inch Nitrex wire was then inserted which was able to easily be passed to the level of the left common iliac vein. Micropuncture sheath was inserted and a Glidewire was passed to the level of the inferior vena cava. The indwelling right GSV 5 French sheath was then exchanged for a 33 cm 9 French vascular sheath. A 6 French, 15 mm loop snare was then inserted to capture the Glidewire from the left-sided access to establish through and through access. From the right sheath, the 90 cross catheter was advanced over the Glidewire into the left common femoral vein, the wire was retracted into the left common femoral vein, thus allowing selection of the left femoral vein. The catheter and wire were directed through the left femoral vein in into the left anterior tibial vein. Left lower extremity venogram was then performed which demonstrated near complete occlusion of the entirety of the popliteal vein, femoral vein, profundus, common femoral vein, external iliac vein, and internal iliac vein. The catheter was removed over a Wholey wire. Computer-assisted suction thrombectomy was then performed with a 7 [place] Lightning Penumbra aspiration catheter with a single pass in a retrograde fashion over the indwelling wire. The wire was then removed and thrombectomy was performed in an antegrade fashion to the level of the external iliac vein. Copious acute appearing thrombus was aspirated into the canister. Due to the larger vessel size, the 7 French catheter was then exchanged for a 12 [place] Lightning Penumbra aspiration catheter after exchange of the indwelling 9 French sheath for an 32 French sheath, and thrombectomy was performed in a retrograde  fashion over the wire to the level of the common femoral vein, then the wire was removed and additional aspiration was performed in antegrade fashion to the level of the iliac confluence. Additional copious acute appearing thrombus was aspirated into the canister. The Glidewire was then advanced once more to the popliteal vein and repeat left lower extremity venogram was performed. Venogram demonstrated significantly improved patency and antegrade flow throughout the popliteal, femoral, common femoral, and external iliac veins. There was high-grade, near obstructive stenosis in the left common iliac vein. There were several rounded filling defects in the left common femoral vein compatible with residual thrombus. Therefore, balloon maceration was performed in the left common femoral vein with a 10 mm by 4 cm Conquest balloon. Limited repeat venogram demonstrated significantly improved patency inflow through the treated segment. Attention was then turned toward addressing the May-Thurner lesion. A 9 French vascular sheath was then inserted via the left common femoral  vein access and directed to the level of the IVC. Intravascular ultrasound was performed of the left external and internal iliac veins which demonstrated severe, greater than 95% stenosis of the left common iliac vein at the level of the right common iliac artery. There was sluggish, near stagnant flow in the peripheral common iliac and left external iliac veins. Size measurements were obtained. Next, under direct fluoroscopic visualization, a 16 mm x 100 mm Medtronic Abre stent was deployed with the central portion at the level of the common iliac confluence, extending into the central external iliac vein. Post appointment balloon molding was performed with a 16 mm x 4 cm Atlas balloon. Intravascular ultrasound evaluation was again performed which demonstrated significantly improved patency of the left iliac veins with excellent wall apposition of the  indwelling stent. The stent appeared well positioned with the medial aspect of the central and of the stent at the level of the iliac confluence, with minimal overhang into the inferior vena cava. Completion venogram left lower extremity was performed which demonstrated patency and brisk antegrade flow through the iliac veins and stent. The indwelling sheaths were removed and hemostasis was achieved with manual pressure. Bandages were applied. A single therapeutic dose of Lovenox was administered prior to departing the IR suite. The patient tolerated the procedure well without immediate complication. FINDINGS: Acute deep vein thrombosis extending from the calf veins to the left common iliac vein. Severe focal stenosis in the common left iliac vein. IMPRESSION: 1. Acute deep vein thrombosis extending from the left common iliac vein to the left calf veins secondary to May-Thurner lesion. 2. Successful aspiration thrombectomy from the left anterior tibial vein to the left common iliac vein. 3. Successful placement of left common iliac to left external iliac self expanding uncovered venous stent (16 x 100 mm Abre). PLAN: 1. Therapeutic Lovenox for 1 month. 2. Left lower extremity compression stocking. 3. Follow-up in 1 month in IR clinic with CT venogram of the abdomen and pelvis, and left lower extremity venous duplex. Ruthann Cancer, MD [facility] Radiology Electronically Signed   By: Ruthann Cancer MD   On: 06/27/2021 07:55   IR US Guide Vasc Access Right  Result Date: 06/27/2021 INDICATION: 70 year old female with acute left lower extremity deep vein thrombosis extending from the calf veins to the left common iliac vein, suspicious for May-Thurner syndrome. History of recent abdominoplasty. EXAM: 1. Ultrasound-guided vascular access of the right greater saphenous vein. 2. Ultrasound-guided vascular access of the left common femoral vein. 3. Left lower extremity  venogram. 4. Mechanical aspiration thrombectomy of the left anterior tibial vein, left popliteal vein, left femoral vein, left common femoral vein, left external iliac vein, and left common iliac vein 5. Balloon venoplasty of the left common femoral vein. 6. Intravascular ultrasound. 7. Stent placement in the left common iliac vein with post stent deployment balloon molding. 8. Completion venogram. COMPARISON:  06/25/2021 MEDICATIONS: None. ANESTHESIA/SEDATION: Versed 3 mg IV; Fentanyl 150 mcg IV Moderate Sedation Time:  2 hours and 24 minutes The patient was continuously monitored during the procedure by the interventional radiology nurse under my direct supervision. FLUOROSCOPY TIME:  Fluoroscopy Time: 32 minutes 48 seconds (326 mGy). CONTRAST:  175 mL Omnipaque 300, intravenous COMPLICATIONS: None immediate. TECHNIQUE: Informed written consent was obtained from the patient after a thorough discussion of the procedural risks, benefits and alternatives. All questions were addressed. Maximal Sterile Barrier Technique was utilized including caps, mask, sterile gowns, sterile gloves, sterile drape, hand  hygiene and skin antiseptic. A timeout was performed prior to the initiation of the procedure. The patient was positioned supine on the IR table. The right groin and left lower extremity were prepped and draped in standard fashion. Preprocedure ultrasound evaluation of the right common femoral and right greater saphenous vein demonstrated patency and compressibility. Access to the central right greater saphenous vein was planned. Subdermal Local anesthesia was provided with 1% lidocaine. A small skin nick was made. Under direct ultrasound visualization, the central right greater saphenous vein was accessed with a 21 gauge micropuncture needle. An image was captured and stored in the permanent record. Over the micropuncture wire, a micropuncture sheath was inserted. A Wholey wire was then directed to the inferior vena  cava and a 5 [place], 11 cm vascular sheath was placed. Baseline activated clotting time was measured and intravenous heparin boluses were administered throughout the procedure to obtain adequate anticoagulation. A 4 French omni Flush catheter was then directed to the peripheral aspect of the inferior vena cava. Limited inferior vena cavagram was performed which demonstrated patency and brisk antegrade flow. A Glidewire was then directed to the left common iliac vein, over which the Omni Flush catheter was exchanged for a 5 French Navicross catheter. The catheter and Glidewire were then directed to the left external iliac vein, however the common femoral vein was unable to be selected. Therefore, attention was turned to gaining antegrade access of the left lower extremity. Ultrasound evaluation of the left posterior tibial vein demonstrated patency with a single safe access point just superior to the level of the ankle. Local anesthesia was provided 1% lidocaine. Under direct ultrasound visualization, the left posterior tibial vein was attempted to be accessed, however vaso spasm occurred which prevented access. Therefore, the left common femoral vein was identified which demonstrated expansile, occlusive thrombus. Access was planned. Subdermal Local anesthesia was provided 1% lidocaine at the planned needle entry site. A small skin nick was made. Under direct ultrasound visualization, the right common femoral vein was accessed with a 21 gauge micropuncture needle. An image was captured and stored in the permanent record. A 0.018 inch Nitrex wire was then inserted which was able to easily be passed to the level of the left common iliac vein. Micropuncture sheath was inserted and a Glidewire was passed to the level of the inferior vena cava. The indwelling right GSV 5 French sheath was then exchanged for a 33 cm 9 French vascular sheath. A 6 French, 15 mm loop snare was then inserted to capture the Glidewire from the  left-sided access to establish through and through access. From the right sheath, the 90 cross catheter was advanced over the Glidewire into the left common femoral vein, the wire was retracted into the left common femoral vein, thus allowing selection of the left femoral vein. The catheter and wire were directed through the left femoral vein in into the left anterior tibial vein. Left lower extremity venogram was then performed which demonstrated near complete occlusion of the entirety of the popliteal vein, femoral vein, profundus, common femoral vein, external iliac vein, and internal iliac vein. The catheter was removed over a Wholey wire. Computer-assisted suction thrombectomy was then performed with a 7 [place] Lightning Penumbra aspiration catheter with a single pass in a retrograde fashion over the indwelling wire. The wire was then removed and thrombectomy was performed in an antegrade fashion to the level of the external iliac vein. Copious acute appearing thrombus was aspirated into the canister. Due to the  larger vessel size, the 7 French catheter was then exchanged for a 12 [place] Lightning Penumbra aspiration catheter after exchange of the indwelling 9 French sheath for an 26 French sheath, and thrombectomy was performed in a retrograde fashion over the wire to the level of the common femoral vein, then the wire was removed and additional aspiration was performed in antegrade fashion to the level of the iliac confluence. Additional copious acute appearing thrombus was aspirated into the canister. The Glidewire was then advanced once more to the popliteal vein and repeat left lower extremity venogram was performed. Venogram demonstrated significantly improved patency and antegrade flow throughout the popliteal, femoral, common femoral, and external iliac veins. There was high-grade, near obstructive stenosis in the left common iliac vein. There were several rounded filling defects in the left common  femoral vein compatible with residual thrombus. Therefore, balloon maceration was performed in the left common femoral vein with a 10 mm by 4 cm Conquest balloon. Limited repeat venogram demonstrated significantly improved patency inflow through the treated segment. Attention was then turned toward addressing the May-Thurner lesion. A 9 French vascular sheath was then inserted via the left common femoral vein access and directed to the level of the IVC. Intravascular ultrasound was performed of the left external and internal iliac veins which demonstrated severe, greater than 95% stenosis of the left common iliac vein at the level of the right common iliac artery. There was sluggish, near stagnant flow in the peripheral common iliac and left external iliac veins. Size measurements were obtained. Next, under direct fluoroscopic visualization, a 16 mm x 100 mm Medtronic Abre stent was deployed with the central portion at the level of the common iliac confluence, extending into the central external iliac vein. Post appointment balloon molding was performed with a 16 mm x 4 cm Atlas balloon. Intravascular ultrasound evaluation was again performed which demonstrated significantly improved patency of the left iliac veins with excellent wall apposition of the indwelling stent. The stent appeared well positioned with the medial aspect of the central and of the stent at the level of the iliac confluence, with minimal overhang into the inferior vena cava. Completion venogram left lower extremity was performed which demonstrated patency and brisk antegrade flow through the iliac veins and stent. The indwelling sheaths were removed and hemostasis was achieved with manual pressure. Bandages were applied. A single therapeutic dose of Lovenox was administered prior to departing the IR suite. The patient tolerated the procedure well without immediate complication. FINDINGS: Acute deep vein thrombosis extending from the calf veins  to the left common iliac vein. Severe focal stenosis in the common left iliac vein. IMPRESSION: 1. Acute deep vein thrombosis extending from the left common iliac vein to the left calf veins secondary to May-Thurner lesion. 2. Successful aspiration thrombectomy from the left anterior tibial vein to the left common iliac vein. 3. Successful placement of left common iliac to left external iliac self expanding uncovered venous stent (16 x 100 mm Abre). PLAN: 1. Therapeutic Lovenox for 1 month. 2. Left lower extremity compression stocking. 3. Follow-up in 1 month in IR clinic with CT venogram of the abdomen and pelvis, and left lower extremity venous duplex. Ruthann Cancer, MD [facility] Radiology Electronically Signed   By: Ruthann Cancer MD   On: 06/27/2021 07:55   ECHOCARDIOGRAM COMPLETE  Result Date: 06/27/2021    ECHOCARDIOGRAM REPORT   Patient Name:   [name] Date of Exam: 06/27/2021 Medical Rec #:  [id number]        Height:       63.0 in Accession #:    [id number]       Weight:       146.4 lb Date of Birth:  Oct 11, 1951         BSA:          1.693 m Patient Age:    10 years         BP:           116/53 mmHg Patient Gender: F                HR:           92 bpm. Exam Location:  Inpatient Procedure: 2D Echo, Cardiac Doppler and Color Doppler Indications:    Pulmonary Embolus I26.09  History:        Patient has prior history of Echocardiogram examinations, most                 recent 01/16/2017. Arrythmias:Atrial Fibrillation.  Sonographer:    Bernadene Person RDCS Referring Phys: [id number] [place]  1. Left ventricular ejection fraction, by estimation, is 55 to 60%. The left ventricle has normal function. The left ventricle has no regional wall motion abnormalities. Left ventricular diastolic parameters were normal.  2. Right ventricular systolic function is normal. The right ventricular size is normal. There is normal pulmonary artery systolic  pressure. The estimated right ventricular systolic pressure is 0000000 mmHg.  3. Left atrial size was mildly dilated.  4. The mitral valve is normal in structure. Mild mitral valve regurgitation. No evidence of mitral stenosis.  5. The aortic valve is tricuspid. Aortic valve regurgitation is not visualized. No aortic stenosis is present.  6. Aortic dilatation noted. There is mild dilatation of the ascending aorta, measuring 38 mm.  7. The inferior vena cava is normal in size with greater than 50% respiratory variability, suggesting right atrial pressure of 3 mmHg. FINDINGS  Left Ventricle: Left ventricular ejection fraction, by estimation, is 55 to 60%. The left ventricle has normal function. The left ventricle has no regional wall motion abnormalities. The left ventricular internal cavity size was normal in size. There is  no left ventricular hypertrophy. Left ventricular diastolic parameters were normal. Right Ventricle: The right ventricular size is normal. No increase in right ventricular wall thickness. Right ventricular systolic function is normal. There is normal pulmonary artery systolic pressure. The tricuspid regurgitant velocity is 2.44 m/s, and  with an assumed right atrial pressure of 3 mmHg, the estimated right ventricular systolic pressure is 0000000 mmHg. Left Atrium: Left atrial size was mildly dilated. Right Atrium: Right atrial size was normal in size. Pericardium: There is no evidence of pericardial effusion. Mitral Valve: The mitral valve is normal in structure. Mild mitral valve regurgitation. No evidence of mitral valve stenosis. Tricuspid Valve: The tricuspid valve is normal in structure. Tricuspid valve regurgitation is trivial. Aortic Valve: The aortic valve is tricuspid. Aortic valve regurgitation is not visualized. No aortic stenosis is present. Pulmonic Valve: The pulmonic valve was normal in structure. Pulmonic valve regurgitation is not visualized. Aorta: The aortic root is normal in size  and structure and aortic dilatation noted. There is mild dilatation of the ascending aorta, measuring 38 mm. Venous: The inferior vena cava is normal in size with greater than 50% respiratory variability, suggesting right atrial pressure of 3 mmHg. IAS/Shunts: No atrial level shunt detected by color flow Doppler.  LEFT VENTRICLE PLAX  2D LVIDd:         4.80 cm  Diastology LVIDs:         3.10 cm  LV e' medial:    8.11 cm/s LV PW:         0.80 cm  LV E/e' medial:  9.4 LV IVS:        1.00 cm  LV e' lateral:   12.60 cm/s LVOT diam:     2.00 cm  LV E/e' lateral: 6.1 LV SV:         59 LV SV Index:   35 LVOT Area:     3.14 cm  RIGHT VENTRICLE RV S prime:     20.80 cm/s TAPSE (M-mode): 2.6 cm LEFT ATRIUM             Index       RIGHT ATRIUM           Index LA diam:        3.90 cm 2.30 cm/m  RA Area:     14.50 cm LA Vol (A2C):   53.4 ml 31.53 ml/m RA Volume:   35.20 ml  20.79 ml/m LA Vol (A4C):   51.5 ml 30.41 ml/m LA Biplane Vol: 53.6 ml 31.65 ml/m  AORTIC VALVE LVOT Vmax:   112.00 cm/s LVOT Vmean:  66.800 cm/s LVOT VTI:    0.189 m  AORTA Ao Root diam: 3.10 cm Ao Asc diam:  3.80 cm MITRAL VALVE               TRICUSPID VALVE MV Area (PHT): 2.91 cm    TR Peak grad:   23.8 mmHg MV Decel Time: 261 msec    TR Vmax:        244.00 cm/s MR Peak grad: 74.3 mmHg MR Mean grad: 57.0 mmHg    SHUNTS MR Vmax:      431.00 cm/s  Systemic VTI:  0.19 m MR Vmean:     366.0 cm/s   Systemic Diam: 2.00 cm MV E velocity: 76.30 cm/s MV A velocity: 50.10 cm/s MV E/A ratio:  1.52 Loralie Champagne MD Electronically signed by Loralie Champagne MD Signature Date/Time: 06/27/2021/4:55:36 PM    Final    CUP PACEART REMOTE DEVICE CHECK  Result Date: 06/22/2021 ILR summary report received. Battery status OK. Normal device function. No new tachy, brady, or pause episodes. No new AF episodes.  There were two symptom episodes detected that show a short run of SVT that may be the patients symptoms, sent to triage.   Monthly summary reports and ROV/PRN Kathy Breach, RN, CCDS, [employer]  CT VENOGRAM ABD/PEL  Result Date: 06/25/2021 CLINICAL DATA:  70 year old with left groin and calf pain 3 weeks after abdominoplasty surgery. Patient has DVT in the left lower extremity. CT venogram needed to evaluate for May-Thurner syndrome and left iliac venous thrombosis. EXAM: CT VENOGRAM ABDOMEN AND PELVIS TECHNIQUE: Multiplanar imaging of the abdomen and pelvis was obtained following administration of intravenous contrast. Venogram protocol was used. CONTRAST:  131m OMNIPAQUE IOHEXOL 350 MG/ML SOLN COMPARISON:  05/07/2012 FINDINGS: Lung bases: Linear densities at lung bases are suggestive for atelectasis. Limited evaluation for pulmonary emboli. Questionable small filling defect in the left lower lobe segmental or subsegmental pulmonary artery on sequence 5 image 2. Hepatobiliary: Slightly decreased attenuation of the liver. Normal appearance of the gallbladder. No suspicious hepatic lesion. Hepatic veins and portal venous system is patent. Pancreas: No evidence for acute inflammation or duct dilatation. Spleen: Normal size.  Single small calcification. Adrenals and kidneys: Normal adrenal glands. Negative for hydronephrosis. Small hypodensity in the posterior left kidney likely represents a cyst. No suspicious renal lesions. Normal appearance of the urinary bladder. Evaluation of bladder is limited due to right hip arthroplasty artifact. Stomach and bowel structures: Normal appearance of the stomach. Suspect previous appendectomy. No evidence for bowel inflammation or obstruction. Lymphatics: No abdominopelvic lymphadenopathy. Arterial structures: Atherosclerotic calcifications in the abdominal aorta without aneurysm. Main visceral arteries are patent. Venous structures: Thrombosis of the left common iliac vein and left external iliac vein. Proximal left femoral veins are occluded. Expansion and thrombosis of the proximal left great saphenous vein. Right common  femoral vein is patent. Right iliac veins are patent. Small focus of thrombus at the junction of the IVC and left common iliac vein. Bilateral renal veins are patent. Incidentally, there is a low lying left retroaortic renal vein which is patent. Inflammatory changes around the proximal aspect of the left common iliac vein thrombosis. Other: Negative for ascites. Stranding in the anterior abdominal soft subcutaneous tissues compatible with recent abdominoplasty surgery. There are tiny low-density collections along the lateral abdominal wall, right side greater than left. Right-sided small fluid collection measures roughly 6 mm in thickness. The left-sided collection measures roughly 5 mm in thickness. No evidence for intra-abdominal ascites. Negative for free air. Reproductive: Hypodense structure along the lower aspect the uterus measures roughly 2.0 cm and could represent a uterine fibroid. No evidence for an adnexal mass. Musculoskeletal: Right hip replacement is located. No acute bone abnormality. IMPRESSION: 1. Positive for deep venous thrombosis involving the left iliac venous system. 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 Inflammatory changes around the left common iliac vein thrombosis. Tiny focus of thrombus extends into the distal IVC at the IVC and left common iliac vein junction. Majority of the IVC is widely patent. 2. Question a tiny filling defect in a left lower lobe pulmonary artery. This study was not designed to evaluate for pulmonary embolism but cannot exclude small focus of pulmonary embolism at this location. This could be further characterized with a chest CTA. 3. Expected postsurgical changes from recent abdominoplasty surgery. Small fluid collections along the lateral lower abdomen bilaterally. These results were called by telephone at the time of  interpretation on 06/25/2021 at 11:00 am to provider [facility] , who verbally acknowledged these results. Electronically Signed   By: Markus Daft M.D.   On: 06/25/2021 12:12   IR INTRAVASCULAR ULTRASOUND NON CORONARY  Result Date: 06/27/2021 INDICATION: 70 year old female with acute left lower extremity deep vein thrombosis extending from the calf veins to the left common iliac vein, suspicious for May-Thurner syndrome. History of recent abdominoplasty. EXAM: 1. Ultrasound-guided vascular access of the right greater saphenous vein. 2. Ultrasound-guided vascular access of the left common femoral vein. 3. Left lower extremity venogram. 4. Mechanical aspiration thrombectomy of the left anterior tibial vein, left popliteal vein, left femoral vein, left common femoral vein, left external iliac vein, and left common iliac vein 5. Balloon venoplasty of the left common femoral vein. 6. Intravascular ultrasound. 7. Stent placement in the left common iliac vein with post stent deployment balloon molding. 8. Completion venogram. COMPARISON:  06/25/2021 MEDICATIONS: None. ANESTHESIA/SEDATION: Versed 3 mg IV; Fentanyl 150 mcg IV Moderate Sedation Time:  2 hours and 24 minutes The patient was continuously monitored during the procedure by the  interventional radiology nurse under my direct supervision. FLUOROSCOPY TIME:  Fluoroscopy Time: 32 minutes 48 seconds (326 mGy). CONTRAST:  175 mL Omnipaque 300, intravenous COMPLICATIONS: None immediate. TECHNIQUE: Informed written consent was obtained from the patient after a thorough discussion of the procedural risks, benefits and alternatives. All questions were addressed. Maximal Sterile Barrier Technique was utilized including caps, mask, sterile gowns, sterile gloves, sterile drape, hand hygiene and skin antiseptic. A timeout was performed prior to the initiation of the procedure. The patient was positioned supine on the IR table. The right groin and left lower extremity were  prepped and draped in standard fashion. Preprocedure ultrasound evaluation of the right common femoral and right greater saphenous vein demonstrated patency and compressibility. Access to the central right greater saphenous vein was planned. Subdermal Local anesthesia was provided with 1% lidocaine. A small skin nick was made. Under direct ultrasound visualization, the central right greater saphenous vein was accessed with a 21 gauge micropuncture needle. An image was captured and stored in the permanent record. Over the micropuncture wire, a micropuncture sheath was inserted. A Wholey wire was then directed to the inferior vena cava and a 5 [place], 11 cm vascular sheath was placed. Baseline activated clotting time was measured and intravenous heparin boluses were administered throughout the procedure to obtain adequate anticoagulation. A 4 French omni Flush catheter was then directed to the peripheral aspect of the inferior vena cava. Limited inferior vena cavagram was performed which demonstrated patency and brisk antegrade flow. A Glidewire was then directed to the left common iliac vein, over which the Omni Flush catheter was exchanged for a 5 French Navicross catheter. The catheter and Glidewire were then directed to the left external iliac vein, however the common femoral vein was unable to be selected. Therefore, attention was turned to gaining antegrade access of the left lower extremity. Ultrasound evaluation of the left posterior tibial vein demonstrated patency with a single safe access point just superior to the level of the ankle. Local anesthesia was provided 1% lidocaine. Under direct ultrasound visualization, the left posterior tibial vein was attempted to be accessed, however vaso spasm occurred which prevented access. Therefore, the left common femoral vein was identified which demonstrated expansile, occlusive thrombus. Access was planned. Subdermal Local anesthesia was provided 1% lidocaine at  the planned needle entry site. A small skin nick was made. Under direct ultrasound visualization, the right common femoral vein was accessed with a 21 gauge micropuncture needle. An image was captured and stored in the permanent record. A 0.018 inch Nitrex wire was then inserted which was able to easily be passed to the level of the left common iliac vein. Micropuncture sheath was inserted and a Glidewire was passed to the level of the inferior vena cava. The indwelling right GSV 5 French sheath was then exchanged for a 33 cm 9 French vascular sheath. A 6 French, 15 mm loop snare was then inserted to capture the Glidewire from the left-sided access to establish through and through access. From the right sheath, the 90 cross catheter was advanced over the Glidewire into the left common femoral vein, the wire was retracted into the left common femoral vein, thus allowing selection of the left femoral vein. The catheter and wire were directed through the left femoral vein in into the left anterior tibial vein. Left lower extremity venogram was then performed which demonstrated near complete occlusion of the entirety of the popliteal vein, femoral vein, profundus, common femoral vein, external iliac vein, and internal iliac  vein. The catheter was removed over a Wholey wire. Computer-assisted suction thrombectomy was then performed with a 7 [place] Lightning Penumbra aspiration catheter with a single pass in a retrograde fashion over the indwelling wire. The wire was then removed and thrombectomy was performed in an antegrade fashion to the level of the external iliac vein. Copious acute appearing thrombus was aspirated into the canister. Due to the larger vessel size, the 7 French catheter was then exchanged for a 12 [place] Lightning Penumbra aspiration catheter after exchange of the indwelling 9 French sheath for an 83 French sheath, and thrombectomy was performed in a retrograde fashion over the wire to the level of  the common femoral vein, then the wire was removed and additional aspiration was performed in antegrade fashion to the level of the iliac confluence. Additional copious acute appearing thrombus was aspirated into the canister. The Glidewire was then advanced once more to the popliteal vein and repeat left lower extremity venogram was performed. Venogram demonstrated significantly improved patency and antegrade flow throughout the popliteal, femoral, common femoral, and external iliac veins. There was high-grade, near obstructive stenosis in the left common iliac vein. There were several rounded filling defects in the left common femoral vein compatible with residual thrombus. Therefore, balloon maceration was performed in the left common femoral vein with a 10 mm by 4 cm Conquest balloon. Limited repeat venogram demonstrated significantly improved patency inflow through the treated segment. Attention was then turned toward addressing the May-Thurner lesion. A 9 French vascular sheath was then inserted via the left common femoral vein access and directed to the level of the IVC. Intravascular ultrasound was performed of the left external and internal iliac veins which demonstrated severe, greater than 95% stenosis of the left common iliac vein at the level of the right common iliac artery. There was sluggish, near stagnant flow in the peripheral common iliac and left external iliac veins. Size measurements were obtained. Next, under direct fluoroscopic visualization, a 16 mm x 100 mm Medtronic Abre stent was deployed with the central portion at the level of the common iliac confluence, extending into the central external iliac vein. Post appointment balloon molding was performed with a 16 mm x 4 cm Atlas balloon. Intravascular ultrasound evaluation was again performed which demonstrated significantly improved patency of the left iliac veins with excellent wall apposition of the indwelling stent. The stent appeared  well positioned with the medial aspect of the central and of the stent at the level of the iliac confluence, with minimal overhang into the inferior vena cava. Completion venogram left lower extremity was performed which demonstrated patency and brisk antegrade flow through the iliac veins and stent. The indwelling sheaths were removed and hemostasis was achieved with manual pressure. Bandages were applied. A single therapeutic dose of Lovenox was administered prior to departing the IR suite. The patient tolerated the procedure well without immediate complication. FINDINGS: Acute deep vein thrombosis extending from the calf veins to the left common iliac vein. Severe focal stenosis in the common left iliac vein. IMPRESSION: 1. Acute deep vein thrombosis extending from the left common iliac vein to the left calf veins secondary to May-Thurner lesion. 2. Successful aspiration thrombectomy from the left anterior tibial vein to the left common iliac vein. 3. Successful placement of left common iliac to left external iliac self expanding uncovered venous stent (16 x 100 mm Abre). PLAN: 1. Therapeutic Lovenox for 1 month. 2. Left lower extremity compression stocking. 3. Follow-up in 1 month in IR  clinic with CT venogram of the abdomen and pelvis, and left lower extremity venous duplex. Ruthann Cancer, MD [facility] Electronically Signed   By: Ruthann Cancer MD   On: 06/27/2021 07:55   IR TRANSCATH PLC STENT 1ST ART NOT LE CV CAR VERT CAR  Result Date: 06/27/2021 INDICATION: 70 year old female with acute left lower extremity deep vein thrombosis extending from the calf veins to the left common iliac vein, suspicious for May-Thurner syndrome. History of recent abdominoplasty. EXAM: 1. Ultrasound-guided vascular access of the right greater saphenous vein. 2. Ultrasound-guided vascular access of the left common femoral vein. 3. Left lower extremity venogram. 4.  Mechanical aspiration thrombectomy of the left anterior tibial vein, left popliteal vein, left femoral vein, left common femoral vein, left external iliac vein, and left common iliac vein 5. Balloon venoplasty of the left common femoral vein. 6. Intravascular ultrasound. 7. Stent placement in the left common iliac vein with post stent deployment balloon molding. 8. Completion venogram. COMPARISON:  06/25/2021 MEDICATIONS: None. ANESTHESIA/SEDATION: Versed 3 mg IV; Fentanyl 150 mcg IV Moderate Sedation Time:  2 hours and 24 minutes The patient was continuously monitored during the procedure by the interventional radiology nurse under my direct supervision. FLUOROSCOPY TIME:  Fluoroscopy Time: 32 minutes 48 seconds (326 mGy). CONTRAST:  175 mL Omnipaque 300, intravenous COMPLICATIONS: None immediate. TECHNIQUE: Informed written consent was obtained from the patient after a thorough discussion of the procedural risks, benefits and alternatives. All questions were addressed. Maximal Sterile Barrier Technique was utilized including caps, mask, sterile gowns, sterile gloves, sterile drape, hand hygiene and skin antiseptic. A timeout was performed prior to the initiation of the procedure. The patient was positioned supine on the IR table. The right groin and left lower extremity were prepped and draped in standard fashion. Preprocedure ultrasound evaluation of the right common femoral and right greater saphenous vein demonstrated patency and compressibility. Access to the central right greater saphenous vein was planned. Subdermal Local anesthesia was provided with 1% lidocaine. A small skin nick was made. Under direct ultrasound visualization, the central right greater saphenous vein was accessed with a 21 gauge micropuncture needle. An image was captured and stored in the permanent record. Over the micropuncture wire, a micropuncture sheath was inserted. A Wholey wire was then directed to the inferior vena cava and a 5  [place], 11 cm vascular sheath was placed. Baseline activated clotting time was measured and intravenous heparin boluses were administered throughout the procedure to obtain adequate anticoagulation. A 4 French omni Flush catheter was then directed to the peripheral aspect of the inferior vena cava. Limited inferior vena cavagram was performed which demonstrated patency and brisk antegrade flow. A Glidewire was then directed to the left common iliac vein, over which the Omni Flush catheter was exchanged for a 5 French Navicross catheter. The catheter and Glidewire were then directed to the left external iliac vein, however the common femoral vein was unable to be selected. Therefore, attention was turned to gaining antegrade access of the left lower extremity. Ultrasound evaluation of the left posterior tibial vein demonstrated patency with a single safe access point just superior to the level of the ankle. Local anesthesia was provided 1% lidocaine. Under direct ultrasound visualization, the left posterior tibial vein was attempted to be accessed, however vaso spasm occurred which prevented access. Therefore, the left common femoral vein was identified which demonstrated expansile, occlusive thrombus. Access was planned. Subdermal Local anesthesia was provided 1% lidocaine at the planned  needle entry site. A small skin nick was made. Under direct ultrasound visualization, the right common femoral vein was accessed with a 21 gauge micropuncture needle. An image was captured and stored in the permanent record. A 0.018 inch Nitrex wire was then inserted which was able to easily be passed to the level of the left common iliac vein. Micropuncture sheath was inserted and a Glidewire was passed to the level of the inferior vena cava. The indwelling right GSV 5 French sheath was then exchanged for a 33 cm 9 French vascular sheath. A 6 French, 15 mm loop snare was then inserted to capture the Glidewire from the left-sided  access to establish through and through access. From the right sheath, the 90 cross catheter was advanced over the Glidewire into the left common femoral vein, the wire was retracted into the left common femoral vein, thus allowing selection of the left femoral vein. The catheter and wire were directed through the left femoral vein in into the left anterior tibial vein. Left lower extremity venogram was then performed which demonstrated near complete occlusion of the entirety of the popliteal vein, femoral vein, profundus, common femoral vein, external iliac vein, and internal iliac vein. The catheter was removed over a Wholey wire. Computer-assisted suction thrombectomy was then performed with a 7 [place] Lightning Penumbra aspiration catheter with a single pass in a retrograde fashion over the indwelling wire. The wire was then removed and thrombectomy was performed in an antegrade fashion to the level of the external iliac vein. Copious acute appearing thrombus was aspirated into the canister. Due to the larger vessel size, the 7 French catheter was then exchanged for a 12 [place] Lightning Penumbra aspiration catheter after exchange of the indwelling 9 French sheath for an 6 French sheath, and thrombectomy was performed in a retrograde fashion over the wire to the level of the common femoral vein, then the wire was removed and additional aspiration was performed in antegrade fashion to the level of the iliac confluence. Additional copious acute appearing thrombus was aspirated into the canister. The Glidewire was then advanced once more to the popliteal vein and repeat left lower extremity venogram was performed. Venogram demonstrated significantly improved patency and antegrade flow throughout the popliteal, femoral, common femoral, and external iliac veins. There was high-grade, near obstructive stenosis in the left common iliac vein. There were several rounded filling defects in the left common femoral vein  compatible with residual thrombus. Therefore, balloon maceration was performed in the left common femoral vein with a 10 mm by 4 cm Conquest balloon. Limited repeat venogram demonstrated significantly improved patency inflow through the treated segment. Attention was then turned toward addressing the May-Thurner lesion. A 9 French vascular sheath was then inserted via the left common femoral vein access and directed to the level of the IVC. Intravascular ultrasound was performed of the left external and internal iliac veins which demonstrated severe, greater than 95% stenosis of the left common iliac vein at the level of the right common iliac artery. There was sluggish, near stagnant flow in the peripheral common iliac and left external iliac veins. Size measurements were obtained. Next, under direct fluoroscopic visualization, a 16 mm x 100 mm Medtronic Abre stent was deployed with the central portion at the level of the common iliac confluence, extending into the central external iliac vein. Post appointment balloon molding was performed with a 16 mm x 4 cm Atlas balloon. Intravascular ultrasound evaluation was again performed which demonstrated significantly improved patency of  the left iliac veins with excellent wall apposition of the indwelling stent. The stent appeared well positioned with the medial aspect of the central and of the stent at the level of the iliac confluence, with minimal overhang into the inferior vena cava. Completion venogram left lower extremity was performed which demonstrated patency and brisk antegrade flow through the iliac veins and stent. The indwelling sheaths were removed and hemostasis was achieved with manual pressure. Bandages were applied. A single therapeutic dose of Lovenox was administered prior to departing the IR suite. The patient tolerated the procedure well without immediate complication. FINDINGS: Acute deep vein thrombosis extending from the calf veins to the left  common iliac vein. Severe focal stenosis in the common left iliac vein. IMPRESSION: 1. Acute deep vein thrombosis extending from the left common iliac vein to the left calf veins secondary to May-Thurner lesion. 2. Successful aspiration thrombectomy from the left anterior tibial vein to the left common iliac vein. 3. Successful placement of left common iliac to left external iliac self expanding uncovered venous stent (16 x 100 mm Abre). PLAN: 1. Therapeutic Lovenox for 1 month. 2. Left lower extremity compression stocking. 3. Follow-up in 1 month in IR clinic with CT venogram of the abdomen and pelvis, and left lower extremity venous duplex. Ruthann Cancer, MD [facility] Radiology Electronically Signed   By: Ruthann Cancer MD   On: 06/27/2021 07:55   VAS [place] LOWER EXTREMITY VENOUS (DVT) (ONLY [facility] & [facility])  Result Date: 06/25/2021  Lower Venous DVT Study Patient Name:  DAMYRA HIRTH  Date of Exam:   06/25/2021 Medical Rec #: [id number]         Accession #:    [id number] Date of Birth: 1951/04/21          Patient Gender: F Patient Age:   70Y Exam Location:  [facility] Procedure:      VAS [place] LOWER EXTREMITY VENOUS (DVT) Referring Phys: [id number] JON KNAPP --------------------------------------------------------------------------------  Indications: Pain. Other Indications: Left groin pain and calf pain 3 weeks post tummy tuck. Comparison Study: No previous exams Performing Technologist: Jody Hill RVT, RDMS  Examination Guidelines: A complete evaluation includes B-mode imaging, spectral Doppler, color Doppler, and power Doppler as needed of all accessible portions of each vessel. Bilateral testing is considered an integral part of a complete examination. Limited examinations for reoccurring indications may be performed as noted. The reflux portion of the exam is performed with the patient in reverse Trendelenburg.   +-----+---------------+---------+-----------+----------+--------------+ RIGHTCompressibilityPhasicitySpontaneityPropertiesThrombus Aging +-----+---------------+---------+-----------+----------+--------------+ CFV  Full           Yes      Yes                                 +-----+---------------+---------+-----------+----------+--------------+   +---------+---------------+---------+-----------+----------+--------------+ LEFT     CompressibilityPhasicitySpontaneityPropertiesThrombus Aging +---------+---------------+---------+-----------+----------+--------------+ CFV      None           No       No                   Acute          +---------+---------------+---------+-----------+----------+--------------+ SFJ      None                                         Acute          +---------+---------------+---------+-----------+----------+--------------+  FV Prox  None           No       No                   Acute          +---------+---------------+---------+-----------+----------+--------------+ FV Mid   None           No       No                   Acute          +---------+---------------+---------+-----------+----------+--------------+ FV DistalNone           No       No                   Acute          +---------+---------------+---------+-----------+----------+--------------+ PFV      None           No       No                   Acute          +---------+---------------+---------+-----------+----------+--------------+ POP      None           No       No                   Acute          +---------+---------------+---------+-----------+----------+--------------+ PTV      Full                                                        +---------+---------------+---------+-----------+----------+--------------+ PERO     None           No       No                   Acute           +---------+---------------+---------+-----------+----------+--------------+ Gastroc  None           No       No                   Acute          +---------+---------------+---------+-----------+----------+--------------+ GSV      None           No       No                   Acute          +---------+---------------+---------+-----------+----------+--------------+ Unable to image iliac system due to bandage from surgery.    Summary: RIGHT: - No evidence of common femoral vein obstruction.  LEFT: - Findings consistent with acute deep vein thrombosis involving the left common femoral vein, SF junction, left femoral vein, left proximal profunda vein, left popliteal vein, left peroneal veins, and left gastrocnemius veins. - Findings consistent with acute superficial vein thrombosis involving the left great saphenous vein. - No cystic structure found in the popliteal fossa.  *See table(s) above for measurements and observations. Electronically signed by Deitra Mayo MD on 06/25/2021 at 5:04:44 PM.    Final     Microbiology: Recent Results (from the past 240 hour(s))  Resp Panel  by RT-PCR (Flu A&B, Covid) Nasopharyngeal Swab     Status: None   Collection Time: 06/25/21 11:47 AM   Specimen: Nasopharyngeal Swab; Nasopharyngeal(NP) swabs in vial transport medium  Result Value Ref Range Status   SARS Coronavirus 2 by RT PCR NEGATIVE NEGATIVE Final    Comment: (NOTE) SARS-CoV-2 target nucleic acids are NOT DETECTED.  The SARS-CoV-2 RNA is generally detectable in upper respiratory specimens during the acute phase of infection. The lowest concentration of SARS-CoV-2 viral copies this assay can detect is 138 copies/mL. A negative result does not preclude SARS-Cov-2 infection and should not be used as the sole basis for treatment or other patient management decisions. A negative result may occur with  improper specimen collection/handling, submission of specimen other than nasopharyngeal swab,  presence of viral mutation(s) within the areas targeted by this assay, and inadequate number of viral copies(<138 copies/mL). A negative result must be combined with clinical observations, patient history, and epidemiological information. The expected result is Negative.  Fact Sheet for Patients:  EntrepreneurPulse.com.au  Fact Sheet for Healthcare Providers:  IncredibleEmployment.be  This test is no t yet approved or cleared by the [place] FDA and  has been authorized for detection and/or diagnosis of SARS-CoV-2 by FDA under an Emergency Use Authorization (EUA). This EUA will remain  in effect (meaning this test can be used) for the duration of the COVID-19 declaration under Section 564(b)(1) of the Act, 21 U.S.C.section 360bbb-3(b)(1), unless the authorization is terminated  or revoked sooner.       Influenza A by PCR NEGATIVE NEGATIVE Final   Influenza B by PCR NEGATIVE NEGATIVE Final    Comment: (NOTE) The Xpert Xpress SARS-CoV-2/FLU/RSV plus assay is intended as an aid in the diagnosis of influenza from Nasopharyngeal swab specimens and should not be used as a sole basis for treatment. Nasal washings and aspirates are unacceptable for Xpert Xpress SARS-CoV-2/FLU/RSV testing.  Fact Sheet for Patients: EntrepreneurPulse.com.au  Fact Sheet for Healthcare Providers: IncredibleEmployment.be  This test is not yet approved or cleared by the [place] FDA and has been authorized for detection and/or diagnosis of SARS-CoV-2 by FDA under an Emergency Use Authorization (EUA). This EUA will remain in effect (meaning this test can be used) for the duration of the COVID-19 declaration under Section 564(b)(1) of the Act, 21 U.S.C. section 360bbb-3(b)(1), unless the authorization is terminated or revoked.  Performed at [facility], [place] [address], [place], [place] [address]       Labs: Basic Metabolic Panel: Recent Labs  Lab 06/25/21 0805 06/26/21 0113 06/27/21 0016 06/28/21 0817  NA 139 139 137 139  K 4.3 3.6 3.8 3.7  CL 103 104 105 107  CO2 '26 24 25 24  '$ GLUCOSE 124* 116* 149* 162*  BUN '21 15 19 9  '$ CREATININE 0.75 0.74 0.90 0.63  CALCIUM 9.2 8.7* 7.8* 8.0*   Liver Function Tests: No results for input(s): AST, ALT, ALKPHOS, BILITOT, PROT, ALBUMIN in the last 168 hours. No results for input(s): LIPASE, AMYLASE in the last 168 hours. No results for input(s): AMMONIA in the last 168 hours. CBC: Recent Labs  Lab 06/26/21 0113 06/27/21 0016 06/27/21 1634 06/28/21 0019 06/28/21 1127  WBC 10.7* 10.4 10.3 9.2 7.4  HGB 10.7* 8.8* 8.8* 7.8* 8.4*  HCT 33.3* 28.1* 27.0* 24.5* 26.4*  MCV 93.8 95.9 93.8 94.6 93.6  PLT 176 166 186 173 194   Cardiac Enzymes: No results for input(s): CKTOTAL, CKMB, CKMBINDEX, TROPONINI in the last 168 hours. BNP:  BNP (last 3 results) No results for input(s): BNP in the last 8760 hours.  ProBNP (last 3 results) No results for input(s): PROBNP in the last 8760 hours.  CBG: No results for input(s): GLUCAP in the last 168 hours.     Signed:  Domenic Polite MD.  [facility] 06/28/2021, 12:29 PM

## 2021-06-28 NOTE — TOC Transition Note (Signed)
Transition of Care [facility]) - CM/SW Discharge Note   Patient Details  Name: Katenia Brotman MRN: [id number] Date of Birth: 1951-04-09  [facility]) CM/SW Contact:  Zenon Mayo, RN Phone Number: 06/28/2021, 1:31 PM   Clinical Narrative:    Paitient is for dc home today, she will dc with lovenox injections, per staff RN patient will be educated on how to do the injections.  NCM  spoke with patient regarding cost of meds informed her the lovenox is 155.11 she is ok with the price of the lovenox.    Final next level of care: Home/Self Care Barriers to Discharge: No Barriers Identified   Patient Goals and CMS Choice Patient states their goals for this hospitalization and ongoing recovery are:: return home      Discharge Placement                       Discharge Plan and Services                  DME Agency: NA       HH Arranged: NA          Social Determinants of Health (SDOH) Interventions     Readmission Risk Interventions No flowsheet data found.

## 2021-06-28 NOTE — Progress Notes (Signed)
Referring Physician(s): Knapp,J  Supervising Physician: Corrie Mckusick  Patient Status:  [facility] - In-pt  Chief Complaint:  May-Thurner syndrome  Brief History:  Khalil Brumleve is a 70 y.o. female who presented to [facility] today with new onset of left groin/calf pain and leg edema since this past Sunday.    Preliminary results from lower extremity venous Doppler study today revealed acute DVT involving the left common femoral vein, SF junction, left femoral vein, left proximal profunda vein, left popliteal vein, left peroneal veins and left gastrocnemius veins.  Also noted was acute superficial vein thrombosis involving the left great saphenous vein.    CT venogram of the abdomen and pelvis revealed: Deep venous thrombosis involving the left iliac venous system. 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 Inflammatory changes around the left common iliac vein thrombosis. Tiny focus of thrombus extends into the distal IVC at the IVC and left common iliac vein junction. Majority of the IVC is widely patent.  She underwent 1) Right greater saphenous vein and left common femoral vein access 2) Suction thrombectomy from the left common iliac to anterior tibial vein 3) Balloon venoplasty of left common femoral vein 4) Intravascular ultrasound 5) Stent placement in left common iliac vein By Dr. Serafina Royals on 06/26/21.  She is currently anticoagulated on Lovenox.  Subjective:  Doing well. Sitting up in bed. No complaints. Eager to go home.  Allergies: Oxycontin [oxycodone hcl], Topamax [topiramate], Metoprolol, Nortriptyline, and Propranolol  Medications: Prior to Admission medications   Medication Sig Start Date End Date Taking? Authorizing Provider  acetaminophen (TYLENOL) 500 MG tablet Take 1,000 mg by mouth every 8 (eight) hours  as needed for mild pain or headache.   Yes [provider]  amoxicillin (AMOXIL) 500 MG tablet Take 2,000 mg by mouth See admin instructions. Take 2000 mg by mouth 1 hour prior to dental procedure   Yes [provider]  Calcium Carb-Cholecalciferol 600-800 MG-UNIT TABS Take 1 tablet by mouth daily.   Yes [provider]  Cholecalciferol (VITAMIN D) 50 MCG (2000 UT) CAPS Take 2,000 Units by mouth daily.   Yes [provider]  conjugated estrogens (PREMARIN) vaginal cream Place 0.5 g vaginally as directed. Every 2 weeks   Yes [provider]  Galcanezumab-gnlm (EMGALITY) 120 MG/ML SOAJ Inject 120 mg into the skin every 30 (thirty) days. 11/29/20  Yes Lomax, Amy, NP  HYDROcodone-acetaminophen (NORCO/VICODIN) 5-325 MG tablet Take 1 tablet by mouth every 6 (six) hours as needed for moderate pain.   Yes [provider]  ibandronate (BONIVA) 150 MG tablet Take 1 tablet by mouth every 30 (thirty) days. 02/10/17  Yes [provider]  iron polysaccharides (NU-IRON) 150 MG capsule Take 1 capsule (150 mg total) by mouth 2 (two) times daily with a meal. 06/28/21 07/28/21 Yes Domenic Polite, MD  losartan (COZAAR) 25 MG tablet TAKE 1 TABLET BY MOUTH EVERY DAY Patient taking differently: Take 25 mg by mouth daily. 02/06/21  Yes Sherran Needs, NP  meclizine (ANTIVERT) 12.5 MG tablet Take 12.5 mg by mouth 3 (three) times daily as needed for dizziness.   Yes [provider]  Multiple Vitamins-Minerals (PRESERVISION AREDS 2 PO) Take 1 capsule by mouth 2 (two) times daily.    Yes [provider]  naratriptan (AMERGE) 2.5 MG tablet Take 1 tablet (2.5 mg total) by mouth as  needed for migraine. Take one (1) tablet at onset of headache; if returns or does not resolve, may repeat after 4 hours; do not exceed five (5) mg in 24 hours. 04/30/21  Yes Lomax, Amy, NP  ondansetron (ZOFRAN) 4 MG tablet Take 1 tablet (4 mg total) by mouth every 8 (eight) hours as  needed for nausea or vomiting. 05/07/21  Yes Scheeler, Carola Rhine, PA-C  promethazine (PHENERGAN) 25 MG tablet TAKE 1 TABLET (25 MG TOTAL) BY MOUTH EVERY 8 (EIGHT) HOURS AS NEEDED FOR NAUSEA. 06/30/18  Yes Dennie Bible, NP  Rimegepant Sulfate (NURTEC) 75 MG TBDP Take 75 mg by mouth daily as needed (take for abortive therapy of migraine, no more than 1 tablet in 24 hours or 10 per month). 04/30/21  Yes Lomax, Amy, NP  enoxaparin (LOVENOX) 80 MG/0.8ML injection Inject 0.7 mLs (70 mg total) into the skin every 12 (twelve) hours. 06/28/21 07/28/21  Domenic Polite, MD     Vital Signs: BP 108/61   Pulse 98   Temp 98.9 F (37.2 C) (Oral)   Resp 17   Ht '5\' 3"'$  (1.6 m)   Wt 66.4 kg   SpO2 97%   BMI 25.93 kg/m   Physical Exam Vitals reviewed.  Constitutional:      Appearance: Normal appearance.  Cardiovascular:     Rate and Rhythm: Normal rate.  Pulmonary:     Effort: Pulmonary effort is normal. No respiratory distress.  Musculoskeletal:     Comments: Both legs with TED hose in place.  Left leg swelling appears resolved.  Neurological:     General: No focal deficit present.     Mental Status: She is alert and oriented to person, place, and time.  Psychiatric:        Mood and Affect: Mood normal.        Behavior: Behavior normal.        Thought Content: Thought content normal.        Judgment: Judgment normal.    Imaging: CT Angio Chest Pulmonary Embolism (PE) W or WO Contrast  Result Date: 06/25/2021 CLINICAL DATA:  70 year old female with left iliofemoral deep vein thrombosis, possible pulmonary embolism on abdomen pelvis CT venogram. EXAM: CT ANGIOGRAPHY CHEST WITH CONTRAST TECHNIQUE: Multidetector CT imaging of the chest was performed using the standard protocol during bolus administration of intravenous contrast. Multiplanar CT image reconstructions and MIPs were obtained to evaluate the vascular anatomy. CONTRAST:  Seventy-five mL Omnipaque 350, intravenous COMPARISON:   06/25/2021, 02/08/2018 FINDINGS: Cardiovascular: Satisfactory opacification of the pulmonary arteries to the segmental level. Non occlusive subsegmental pulmonary embolism in the posterior left lower lobe. Nonocclusive distal lobar/proximal segmental pulmonary embolism in the right upper lobe. Right to left ventricular ratio is less than 0.9. Normal heart size. No pericardial effusion. Mediastinum/Nodes: No enlarged mediastinal, hilar, or axillary lymph nodes. Thyroid gland, trachea, and esophagus demonstrate no significant findings. Lungs/Pleura: Minimal bibasilar subsegmental atelectasis. No focal consolidations. No pleural effusion or pneumothorax. Upper Abdomen: The visualized upper abdomen is within normal limits. Musculoskeletal: No chest wall abnormality. No acute or significant osseous findings. Review of the MIP images confirms the above findings. IMPRESSION: Vascular: Small burden of right upper lobar/segmental and left lower lobe posterior segmental acute pulmonary emboli. No evidence of right heart strain. Non-Vascular: Minimal bibasilar subsegmental atelectasis. These results were called by telephone at the time of interpretation on 06/25/2021 at 1:41 pm to provider [facility] , who verbally acknowledged these results. Ruthann Cancer, MD [facility]  [facility] Electronically Signed   By: Ruthann Cancer MD   On: 06/25/2021 13:42   IR Veno/Ext/Uni Left  Result Date: 06/27/2021 INDICATION: 70 year old female with acute left lower extremity deep vein thrombosis extending from the calf veins to the left common iliac vein, suspicious for May-Thurner syndrome. History of recent abdominoplasty. EXAM: 1. Ultrasound-guided vascular access of the right greater saphenous vein. 2. Ultrasound-guided vascular access of the left common femoral vein. 3. Left lower extremity venogram. 4. Mechanical aspiration thrombectomy of the left anterior tibial vein, left popliteal vein,  left femoral vein, left common femoral vein, left external iliac vein, and left common iliac vein 5. Balloon venoplasty of the left common femoral vein. 6. Intravascular ultrasound. 7. Stent placement in the left common iliac vein with post stent deployment balloon molding. 8. Completion venogram. COMPARISON:  06/25/2021 MEDICATIONS: None. ANESTHESIA/SEDATION: Versed 3 mg IV; Fentanyl 150 mcg IV Moderate Sedation Time:  2 hours and 24 minutes The patient was continuously monitored during the procedure by the interventional radiology nurse under my direct supervision. FLUOROSCOPY TIME:  Fluoroscopy Time: 32 minutes 48 seconds (326 mGy). CONTRAST:  175 mL Omnipaque 300, intravenous COMPLICATIONS: None immediate. TECHNIQUE: Informed written consent was obtained from the patient after a thorough discussion of the procedural risks, benefits and alternatives. All questions were addressed. Maximal Sterile Barrier Technique was utilized including caps, mask, sterile gowns, sterile gloves, sterile drape, hand hygiene and skin antiseptic. A timeout was performed prior to the initiation of the procedure. The patient was positioned supine on the IR table. The right groin and left lower extremity were prepped and draped in standard fashion. Preprocedure ultrasound evaluation of the right common femoral and right greater saphenous vein demonstrated patency and compressibility. Access to the central right greater saphenous vein was planned. Subdermal Local anesthesia was provided with 1% lidocaine. A small skin nick was made. Under direct ultrasound visualization, the central right greater saphenous vein was accessed with a 21 gauge micropuncture needle. An image was captured and stored in the permanent record. Over the micropuncture wire, a micropuncture sheath was inserted. A Wholey wire was then directed to the inferior vena cava and a 5 [place], 11 cm vascular sheath was placed. Baseline activated clotting time was measured and  intravenous heparin boluses were administered throughout the procedure to obtain adequate anticoagulation. A 4 French omni Flush catheter was then directed to the peripheral aspect of the inferior vena cava. Limited inferior vena cavagram was performed which demonstrated patency and brisk antegrade flow. A Glidewire was then directed to the left common iliac vein, over which the Omni Flush catheter was exchanged for a 5 French Navicross catheter. The catheter and Glidewire were then directed to the left external iliac vein, however the common femoral vein was unable to be selected. Therefore, attention was turned to gaining antegrade access of the left lower extremity. Ultrasound evaluation of the left posterior tibial vein demonstrated patency with a single safe access point just superior to the level of the ankle. Local anesthesia was provided 1% lidocaine. Under direct ultrasound visualization, the left posterior tibial vein was attempted to be accessed, however vaso spasm occurred which prevented access. Therefore, the left common femoral vein was identified which demonstrated expansile, occlusive thrombus. Access was planned. Subdermal Local anesthesia was provided 1% lidocaine at the planned needle entry site. A small skin nick was made. Under direct ultrasound visualization, the right common femoral vein was accessed with a 21 gauge micropuncture needle. An image was captured and stored  in the permanent record. A 0.018 inch Nitrex wire was then inserted which was able to easily be passed to the level of the left common iliac vein. Micropuncture sheath was inserted and a Glidewire was passed to the level of the inferior vena cava. The indwelling right GSV 5 French sheath was then exchanged for a 33 cm 9 French vascular sheath. A 6 French, 15 mm loop snare was then inserted to capture the Glidewire from the left-sided access to establish through and through access. From the right sheath, the 90 cross catheter  was advanced over the Glidewire into the left common femoral vein, the wire was retracted into the left common femoral vein, thus allowing selection of the left femoral vein. The catheter and wire were directed through the left femoral vein in into the left anterior tibial vein. Left lower extremity venogram was then performed which demonstrated near complete occlusion of the entirety of the popliteal vein, femoral vein, profundus, common femoral vein, external iliac vein, and internal iliac vein. The catheter was removed over a Wholey wire. Computer-assisted suction thrombectomy was then performed with a 7 [place] Lightning Penumbra aspiration catheter with a single pass in a retrograde fashion over the indwelling wire. The wire was then removed and thrombectomy was performed in an antegrade fashion to the level of the external iliac vein. Copious acute appearing thrombus was aspirated into the canister. Due to the larger vessel size, the 7 French catheter was then exchanged for a 12 [place] Lightning Penumbra aspiration catheter after exchange of the indwelling 9 French sheath for an 87 French sheath, and thrombectomy was performed in a retrograde fashion over the wire to the level of the common femoral vein, then the wire was removed and additional aspiration was performed in antegrade fashion to the level of the iliac confluence. Additional copious acute appearing thrombus was aspirated into the canister. The Glidewire was then advanced once more to the popliteal vein and repeat left lower extremity venogram was performed. Venogram demonstrated significantly improved patency and antegrade flow throughout the popliteal, femoral, common femoral, and external iliac veins. There was high-grade, near obstructive stenosis in the left common iliac vein. There were several rounded filling defects in the left common femoral vein compatible with residual thrombus. Therefore, balloon maceration was performed in the left  common femoral vein with a 10 mm by 4 cm Conquest balloon. Limited repeat venogram demonstrated significantly improved patency inflow through the treated segment. Attention was then turned toward addressing the May-Thurner lesion. A 9 French vascular sheath was then inserted via the left common femoral vein access and directed to the level of the IVC. Intravascular ultrasound was performed of the left external and internal iliac veins which demonstrated severe, greater than 95% stenosis of the left common iliac vein at the level of the right common iliac artery. There was sluggish, near stagnant flow in the peripheral common iliac and left external iliac veins. Size measurements were obtained. Next, under direct fluoroscopic visualization, a 16 mm x 100 mm Medtronic Abre stent was deployed with the central portion at the level of the common iliac confluence, extending into the central external iliac vein. Post appointment balloon molding was performed with a 16 mm x 4 cm Atlas balloon. Intravascular ultrasound evaluation was again performed which demonstrated significantly improved patency of the left iliac veins with excellent wall apposition of the indwelling stent. The stent appeared well positioned with the medial aspect of the central and of the stent at the level of  the iliac confluence, with minimal overhang into the inferior vena cava. Completion venogram left lower extremity was performed which demonstrated patency and brisk antegrade flow through the iliac veins and stent. The indwelling sheaths were removed and hemostasis was achieved with manual pressure. Bandages were applied. A single therapeutic dose of Lovenox was administered prior to departing the IR suite. The patient tolerated the procedure well without immediate complication. FINDINGS: Acute deep vein thrombosis extending from the calf veins to the left common iliac vein. Severe focal stenosis in the common left iliac vein. IMPRESSION: 1. Acute  deep vein thrombosis extending from the left common iliac vein to the left calf veins secondary to May-Thurner lesion. 2. Successful aspiration thrombectomy from the left anterior tibial vein to the left common iliac vein. 3. Successful placement of left common iliac to left external iliac self expanding uncovered venous stent (16 x 100 mm Abre). PLAN: 1. Therapeutic Lovenox for 1 month. 2. Left lower extremity compression stocking. 3. Follow-up in 1 month in IR clinic with CT venogram of the abdomen and pelvis, and left lower extremity venous duplex. Ruthann Cancer, MD [facility] Electronically Signed   By: Ruthann Cancer MD   On: 06/27/2021 07:55   IR [facility] MOD SED  Result Date: 06/27/2021 INDICATION: 70 year old female with acute left lower extremity deep vein thrombosis extending from the calf veins to the left common iliac vein, suspicious for May-Thurner syndrome. History of recent abdominoplasty. EXAM: 1. Ultrasound-guided vascular access of the right greater saphenous vein. 2. Ultrasound-guided vascular access of the left common femoral vein. 3. Left lower extremity venogram. 4. Mechanical aspiration thrombectomy of the left anterior tibial vein, left popliteal vein, left femoral vein, left common femoral vein, left external iliac vein, and left common iliac vein 5. Balloon venoplasty of the left common femoral vein. 6. Intravascular ultrasound. 7. Stent placement in the left common iliac vein with post stent deployment balloon molding. 8. Completion venogram. COMPARISON:  06/25/2021 MEDICATIONS: None. ANESTHESIA/SEDATION: Versed 3 mg IV; Fentanyl 150 mcg IV Moderate Sedation Time:  2 hours and 24 minutes The patient was continuously monitored during the procedure by the interventional radiology nurse under my direct supervision. FLUOROSCOPY TIME:  Fluoroscopy Time: 32 minutes 48 seconds (326 mGy). CONTRAST:  175 mL Omnipaque 300,  intravenous COMPLICATIONS: None immediate. TECHNIQUE: Informed written consent was obtained from the patient after a thorough discussion of the procedural risks, benefits and alternatives. All questions were addressed. Maximal Sterile Barrier Technique was utilized including caps, mask, sterile gowns, sterile gloves, sterile drape, hand hygiene and skin antiseptic. A timeout was performed prior to the initiation of the procedure. The patient was positioned supine on the IR table. The right groin and left lower extremity were prepped and draped in standard fashion. Preprocedure ultrasound evaluation of the right common femoral and right greater saphenous vein demonstrated patency and compressibility. Access to the central right greater saphenous vein was planned. Subdermal Local anesthesia was provided with 1% lidocaine. A small skin nick was made. Under direct ultrasound visualization, the central right greater saphenous vein was accessed with a 21 gauge micropuncture needle. An image was captured and stored in the permanent record. Over the micropuncture wire, a micropuncture sheath was inserted. A Wholey wire was then directed to the inferior vena cava and a 5 [place], 11 cm vascular sheath was placed. Baseline activated clotting time was measured and intravenous heparin boluses were administered throughout the procedure to obtain adequate anticoagulation. A 4  French omni Flush catheter was then directed to the peripheral aspect of the inferior vena cava. Limited inferior vena cavagram was performed which demonstrated patency and brisk antegrade flow. A Glidewire was then directed to the left common iliac vein, over which the Omni Flush catheter was exchanged for a 5 French Navicross catheter. The catheter and Glidewire were then directed to the left external iliac vein, however the common femoral vein was unable to be selected. Therefore, attention was turned to gaining antegrade access of the left lower  extremity. Ultrasound evaluation of the left posterior tibial vein demonstrated patency with a single safe access point just superior to the level of the ankle. Local anesthesia was provided 1% lidocaine. Under direct ultrasound visualization, the left posterior tibial vein was attempted to be accessed, however vaso spasm occurred which prevented access. Therefore, the left common femoral vein was identified which demonstrated expansile, occlusive thrombus. Access was planned. Subdermal Local anesthesia was provided 1% lidocaine at the planned needle entry site. A small skin nick was made. Under direct ultrasound visualization, the right common femoral vein was accessed with a 21 gauge micropuncture needle. An image was captured and stored in the permanent record. A 0.018 inch Nitrex wire was then inserted which was able to easily be passed to the level of the left common iliac vein. Micropuncture sheath was inserted and a Glidewire was passed to the level of the inferior vena cava. The indwelling right GSV 5 French sheath was then exchanged for a 33 cm 9 French vascular sheath. A 6 French, 15 mm loop snare was then inserted to capture the Glidewire from the left-sided access to establish through and through access. From the right sheath, the 90 cross catheter was advanced over the Glidewire into the left common femoral vein, the wire was retracted into the left common femoral vein, thus allowing selection of the left femoral vein. The catheter and wire were directed through the left femoral vein in into the left anterior tibial vein. Left lower extremity venogram was then performed which demonstrated near complete occlusion of the entirety of the popliteal vein, femoral vein, profundus, common femoral vein, external iliac vein, and internal iliac vein. The catheter was removed over a Wholey wire. Computer-assisted suction thrombectomy was then performed with a 7 [place] Lightning Penumbra aspiration catheter with a  single pass in a retrograde fashion over the indwelling wire. The wire was then removed and thrombectomy was performed in an antegrade fashion to the level of the external iliac vein. Copious acute appearing thrombus was aspirated into the canister. Due to the larger vessel size, the 7 French catheter was then exchanged for a 12 [place] Lightning Penumbra aspiration catheter after exchange of the indwelling 9 French sheath for an 47 French sheath, and thrombectomy was performed in a retrograde fashion over the wire to the level of the common femoral vein, then the wire was removed and additional aspiration was performed in antegrade fashion to the level of the iliac confluence. Additional copious acute appearing thrombus was aspirated into the canister. The Glidewire was then advanced once more to the popliteal vein and repeat left lower extremity venogram was performed. Venogram demonstrated significantly improved patency and antegrade flow throughout the popliteal, femoral, common femoral, and external iliac veins. There was high-grade, near obstructive stenosis in the left common iliac vein. There were several rounded filling defects in the left common femoral vein compatible with residual thrombus. Therefore, balloon maceration was performed in the left common femoral vein with a  10 mm by 4 cm Conquest balloon. Limited repeat venogram demonstrated significantly improved patency inflow through the treated segment. Attention was then turned toward addressing the May-Thurner lesion. A 9 French vascular sheath was then inserted via the left common femoral vein access and directed to the level of the IVC. Intravascular ultrasound was performed of the left external and internal iliac veins which demonstrated severe, greater than 95% stenosis of the left common iliac vein at the level of the right common iliac artery. There was sluggish, near stagnant flow in the peripheral common iliac and left external iliac veins.  Size measurements were obtained. Next, under direct fluoroscopic visualization, a 16 mm x 100 mm Medtronic Abre stent was deployed with the central portion at the level of the common iliac confluence, extending into the central external iliac vein. Post appointment balloon molding was performed with a 16 mm x 4 cm Atlas balloon. Intravascular ultrasound evaluation was again performed which demonstrated significantly improved patency of the left iliac veins with excellent wall apposition of the indwelling stent. The stent appeared well positioned with the medial aspect of the central and of the stent at the level of the iliac confluence, with minimal overhang into the inferior vena cava. Completion venogram left lower extremity was performed which demonstrated patency and brisk antegrade flow through the iliac veins and stent. The indwelling sheaths were removed and hemostasis was achieved with manual pressure. Bandages were applied. A single therapeutic dose of Lovenox was administered prior to departing the IR suite. The patient tolerated the procedure well without immediate complication. FINDINGS: Acute deep vein thrombosis extending from the calf veins to the left common iliac vein. Severe focal stenosis in the common left iliac vein. IMPRESSION: 1. Acute deep vein thrombosis extending from the left common iliac vein to the left calf veins secondary to May-Thurner lesion. 2. Successful aspiration thrombectomy from the left anterior tibial vein to the left common iliac vein. 3. Successful placement of left common iliac to left external iliac self expanding uncovered venous stent (16 x 100 mm Abre). PLAN: 1. Therapeutic Lovenox for 1 month. 2. Left lower extremity compression stocking. 3. Follow-up in 1 month in IR clinic with CT venogram of the abdomen and pelvis, and left lower extremity venous duplex. Ruthann Cancer, MD [facility] Radiology Electronically Signed    By: Ruthann Cancer MD   On: 06/27/2021 07:55   IR US Guide Vasc Access Left  Result Date: 06/27/2021 INDICATION: 70 year old female with acute left lower extremity deep vein thrombosis extending from the calf veins to the left common iliac vein, suspicious for May-Thurner syndrome. History of recent abdominoplasty. EXAM: 1. Ultrasound-guided vascular access of the right greater saphenous vein. 2. Ultrasound-guided vascular access of the left common femoral vein. 3. Left lower extremity venogram. 4. Mechanical aspiration thrombectomy of the left anterior tibial vein, left popliteal vein, left femoral vein, left common femoral vein, left external iliac vein, and left common iliac vein 5. Balloon venoplasty of the left common femoral vein. 6. Intravascular ultrasound. 7. Stent placement in the left common iliac vein with post stent deployment balloon molding. 8. Completion venogram. COMPARISON:  06/25/2021 MEDICATIONS: None. ANESTHESIA/SEDATION: Versed 3 mg IV; Fentanyl 150 mcg IV Moderate Sedation Time:  2 hours and 24 minutes The patient was continuously monitored during the procedure by the interventional radiology nurse under my direct supervision. FLUOROSCOPY TIME:  Fluoroscopy Time: 32 minutes 48 seconds (326 mGy). CONTRAST:  175 mL Omnipaque 300, intravenous COMPLICATIONS: None  immediate. TECHNIQUE: Informed written consent was obtained from the patient after a thorough discussion of the procedural risks, benefits and alternatives. All questions were addressed. Maximal Sterile Barrier Technique was utilized including caps, mask, sterile gowns, sterile gloves, sterile drape, hand hygiene and skin antiseptic. A timeout was performed prior to the initiation of the procedure. The patient was positioned supine on the IR table. The right groin and left lower extremity were prepped and draped in standard fashion. Preprocedure ultrasound evaluation of the right common femoral and right greater saphenous vein  demonstrated patency and compressibility. Access to the central right greater saphenous vein was planned. Subdermal Local anesthesia was provided with 1% lidocaine. A small skin nick was made. Under direct ultrasound visualization, the central right greater saphenous vein was accessed with a 21 gauge micropuncture needle. An image was captured and stored in the permanent record. Over the micropuncture wire, a micropuncture sheath was inserted. A Wholey wire was then directed to the inferior vena cava and a 5 [place], 11 cm vascular sheath was placed. Baseline activated clotting time was measured and intravenous heparin boluses were administered throughout the procedure to obtain adequate anticoagulation. A 4 French omni Flush catheter was then directed to the peripheral aspect of the inferior vena cava. Limited inferior vena cavagram was performed which demonstrated patency and brisk antegrade flow. A Glidewire was then directed to the left common iliac vein, over which the Omni Flush catheter was exchanged for a 5 French Navicross catheter. The catheter and Glidewire were then directed to the left external iliac vein, however the common femoral vein was unable to be selected. Therefore, attention was turned to gaining antegrade access of the left lower extremity. Ultrasound evaluation of the left posterior tibial vein demonstrated patency with a single safe access point just superior to the level of the ankle. Local anesthesia was provided 1% lidocaine. Under direct ultrasound visualization, the left posterior tibial vein was attempted to be accessed, however vaso spasm occurred which prevented access. Therefore, the left common femoral vein was identified which demonstrated expansile, occlusive thrombus. Access was planned. Subdermal Local anesthesia was provided 1% lidocaine at the planned needle entry site. A small skin nick was made. Under direct ultrasound visualization, the right common femoral vein was  accessed with a 21 gauge micropuncture needle. An image was captured and stored in the permanent record. A 0.018 inch Nitrex wire was then inserted which was able to easily be passed to the level of the left common iliac vein. Micropuncture sheath was inserted and a Glidewire was passed to the level of the inferior vena cava. The indwelling right GSV 5 French sheath was then exchanged for a 33 cm 9 French vascular sheath. A 6 French, 15 mm loop snare was then inserted to capture the Glidewire from the left-sided access to establish through and through access. From the right sheath, the 90 cross catheter was advanced over the Glidewire into the left common femoral vein, the wire was retracted into the left common femoral vein, thus allowing selection of the left femoral vein. The catheter and wire were directed through the left femoral vein in into the left anterior tibial vein. Left lower extremity venogram was then performed which demonstrated near complete occlusion of the entirety of the popliteal vein, femoral vein, profundus, common femoral vein, external iliac vein, and internal iliac vein. The catheter was removed over a Wholey wire. Computer-assisted suction thrombectomy was then performed with a 7 [place] Lightning Penumbra aspiration catheter with a single pass  in a retrograde fashion over the indwelling wire. The wire was then removed and thrombectomy was performed in an antegrade fashion to the level of the external iliac vein. Copious acute appearing thrombus was aspirated into the canister. Due to the larger vessel size, the 7 French catheter was then exchanged for a 12 [place] Lightning Penumbra aspiration catheter after exchange of the indwelling 9 French sheath for an 10 French sheath, and thrombectomy was performed in a retrograde fashion over the wire to the level of the common femoral vein, then the wire was removed and additional aspiration was performed in antegrade fashion to the level of the  iliac confluence. Additional copious acute appearing thrombus was aspirated into the canister. The Glidewire was then advanced once more to the popliteal vein and repeat left lower extremity venogram was performed. Venogram demonstrated significantly improved patency and antegrade flow throughout the popliteal, femoral, common femoral, and external iliac veins. There was high-grade, near obstructive stenosis in the left common iliac vein. There were several rounded filling defects in the left common femoral vein compatible with residual thrombus. Therefore, balloon maceration was performed in the left common femoral vein with a 10 mm by 4 cm Conquest balloon. Limited repeat venogram demonstrated significantly improved patency inflow through the treated segment. Attention was then turned toward addressing the May-Thurner lesion. A 9 French vascular sheath was then inserted via the left common femoral vein access and directed to the level of the IVC. Intravascular ultrasound was performed of the left external and internal iliac veins which demonstrated severe, greater than 95% stenosis of the left common iliac vein at the level of the right common iliac artery. There was sluggish, near stagnant flow in the peripheral common iliac and left external iliac veins. Size measurements were obtained. Next, under direct fluoroscopic visualization, a 16 mm x 100 mm Medtronic Abre stent was deployed with the central portion at the level of the common iliac confluence, extending into the central external iliac vein. Post appointment balloon molding was performed with a 16 mm x 4 cm Atlas balloon. Intravascular ultrasound evaluation was again performed which demonstrated significantly improved patency of the left iliac veins with excellent wall apposition of the indwelling stent. The stent appeared well positioned with the medial aspect of the central and of the stent at the level of the iliac confluence, with minimal overhang  into the inferior vena cava. Completion venogram left lower extremity was performed which demonstrated patency and brisk antegrade flow through the iliac veins and stent. The indwelling sheaths were removed and hemostasis was achieved with manual pressure. Bandages were applied. A single therapeutic dose of Lovenox was administered prior to departing the IR suite. The patient tolerated the procedure well without immediate complication. FINDINGS: Acute deep vein thrombosis extending from the calf veins to the left common iliac vein. Severe focal stenosis in the common left iliac vein. IMPRESSION: 1. Acute deep vein thrombosis extending from the left common iliac vein to the left calf veins secondary to May-Thurner lesion. 2. Successful aspiration thrombectomy from the left anterior tibial vein to the left common iliac vein. 3. Successful placement of left common iliac to left external iliac self expanding uncovered venous stent (16 x 100 mm Abre). PLAN: 1. Therapeutic Lovenox for 1 month. 2. Left lower extremity compression stocking. 3. Follow-up in 1 month in IR clinic with CT venogram of the abdomen and pelvis, and left lower extremity venous duplex. Ruthann Cancer, MD [facility] Radiology Electronically Signed  By: Ruthann Cancer MD   On: 06/27/2021 07:55   IR US Guide Vasc Access Right  Result Date: 06/27/2021 INDICATION: 70 year old female with acute left lower extremity deep vein thrombosis extending from the calf veins to the left common iliac vein, suspicious for May-Thurner syndrome. History of recent abdominoplasty. EXAM: 1. Ultrasound-guided vascular access of the right greater saphenous vein. 2. Ultrasound-guided vascular access of the left common femoral vein. 3. Left lower extremity venogram. 4. Mechanical aspiration thrombectomy of the left anterior tibial vein, left popliteal vein, left femoral vein, left common femoral vein, left external iliac vein,  and left common iliac vein 5. Balloon venoplasty of the left common femoral vein. 6. Intravascular ultrasound. 7. Stent placement in the left common iliac vein with post stent deployment balloon molding. 8. Completion venogram. COMPARISON:  06/25/2021 MEDICATIONS: None. ANESTHESIA/SEDATION: Versed 3 mg IV; Fentanyl 150 mcg IV Moderate Sedation Time:  2 hours and 24 minutes The patient was continuously monitored during the procedure by the interventional radiology nurse under my direct supervision. FLUOROSCOPY TIME:  Fluoroscopy Time: 32 minutes 48 seconds (326 mGy). CONTRAST:  175 mL Omnipaque 300, intravenous COMPLICATIONS: None immediate. TECHNIQUE: Informed written consent was obtained from the patient after a thorough discussion of the procedural risks, benefits and alternatives. All questions were addressed. Maximal Sterile Barrier Technique was utilized including caps, mask, sterile gowns, sterile gloves, sterile drape, hand hygiene and skin antiseptic. A timeout was performed prior to the initiation of the procedure. The patient was positioned supine on the IR table. The right groin and left lower extremity were prepped and draped in standard fashion. Preprocedure ultrasound evaluation of the right common femoral and right greater saphenous vein demonstrated patency and compressibility. Access to the central right greater saphenous vein was planned. Subdermal Local anesthesia was provided with 1% lidocaine. A small skin nick was made. Under direct ultrasound visualization, the central right greater saphenous vein was accessed with a 21 gauge micropuncture needle. An image was captured and stored in the permanent record. Over the micropuncture wire, a micropuncture sheath was inserted. A Wholey wire was then directed to the inferior vena cava and a 5 [place], 11 cm vascular sheath was placed. Baseline activated clotting time was measured and intravenous heparin boluses were administered throughout the procedure  to obtain adequate anticoagulation. A 4 French omni Flush catheter was then directed to the peripheral aspect of the inferior vena cava. Limited inferior vena cavagram was performed which demonstrated patency and brisk antegrade flow. A Glidewire was then directed to the left common iliac vein, over which the Omni Flush catheter was exchanged for a 5 French Navicross catheter. The catheter and Glidewire were then directed to the left external iliac vein, however the common femoral vein was unable to be selected. Therefore, attention was turned to gaining antegrade access of the left lower extremity. Ultrasound evaluation of the left posterior tibial vein demonstrated patency with a single safe access point just superior to the level of the ankle. Local anesthesia was provided 1% lidocaine. Under direct ultrasound visualization, the left posterior tibial vein was attempted to be accessed, however vaso spasm occurred which prevented access. Therefore, the left common femoral vein was identified which demonstrated expansile, occlusive thrombus. Access was planned. Subdermal Local anesthesia was provided 1% lidocaine at the planned needle entry site. A small skin nick was made. Under direct ultrasound visualization, the right common femoral vein was accessed with a 21 gauge micropuncture needle. An image was captured and stored in the permanent  record. A 0.018 inch Nitrex wire was then inserted which was able to easily be passed to the level of the left common iliac vein. Micropuncture sheath was inserted and a Glidewire was passed to the level of the inferior vena cava. The indwelling right GSV 5 French sheath was then exchanged for a 33 cm 9 French vascular sheath. A 6 French, 15 mm loop snare was then inserted to capture the Glidewire from the left-sided access to establish through and through access. From the right sheath, the 90 cross catheter was advanced over the Glidewire into the left common femoral vein, the  wire was retracted into the left common femoral vein, thus allowing selection of the left femoral vein. The catheter and wire were directed through the left femoral vein in into the left anterior tibial vein. Left lower extremity venogram was then performed which demonstrated near complete occlusion of the entirety of the popliteal vein, femoral vein, profundus, common femoral vein, external iliac vein, and internal iliac vein. The catheter was removed over a Wholey wire. Computer-assisted suction thrombectomy was then performed with a 7 [place] Lightning Penumbra aspiration catheter with a single pass in a retrograde fashion over the indwelling wire. The wire was then removed and thrombectomy was performed in an antegrade fashion to the level of the external iliac vein. Copious acute appearing thrombus was aspirated into the canister. Due to the larger vessel size, the 7 French catheter was then exchanged for a 12 [place] Lightning Penumbra aspiration catheter after exchange of the indwelling 9 French sheath for an 41 French sheath, and thrombectomy was performed in a retrograde fashion over the wire to the level of the common femoral vein, then the wire was removed and additional aspiration was performed in antegrade fashion to the level of the iliac confluence. Additional copious acute appearing thrombus was aspirated into the canister. The Glidewire was then advanced once more to the popliteal vein and repeat left lower extremity venogram was performed. Venogram demonstrated significantly improved patency and antegrade flow throughout the popliteal, femoral, common femoral, and external iliac veins. There was high-grade, near obstructive stenosis in the left common iliac vein. There were several rounded filling defects in the left common femoral vein compatible with residual thrombus. Therefore, balloon maceration was performed in the left common femoral vein with a 10 mm by 4 cm Conquest balloon. Limited repeat  venogram demonstrated significantly improved patency inflow through the treated segment. Attention was then turned toward addressing the May-Thurner lesion. A 9 French vascular sheath was then inserted via the left common femoral vein access and directed to the level of the IVC. Intravascular ultrasound was performed of the left external and internal iliac veins which demonstrated severe, greater than 95% stenosis of the left common iliac vein at the level of the right common iliac artery. There was sluggish, near stagnant flow in the peripheral common iliac and left external iliac veins. Size measurements were obtained. Next, under direct fluoroscopic visualization, a 16 mm x 100 mm Medtronic Abre stent was deployed with the central portion at the level of the common iliac confluence, extending into the central external iliac vein. Post appointment balloon molding was performed with a 16 mm x 4 cm Atlas balloon. Intravascular ultrasound evaluation was again performed which demonstrated significantly improved patency of the left iliac veins with excellent wall apposition of the indwelling stent. The stent appeared well positioned with the medial aspect of the central and of the stent at the level of the iliac confluence,  with minimal overhang into the inferior vena cava. Completion venogram left lower extremity was performed which demonstrated patency and brisk antegrade flow through the iliac veins and stent. The indwelling sheaths were removed and hemostasis was achieved with manual pressure. Bandages were applied. A single therapeutic dose of Lovenox was administered prior to departing the IR suite. The patient tolerated the procedure well without immediate complication. FINDINGS: Acute deep vein thrombosis extending from the calf veins to the left common iliac vein. Severe focal stenosis in the common left iliac vein. IMPRESSION: 1. Acute deep vein thrombosis extending from the left common iliac vein to the left  calf veins secondary to May-Thurner lesion. 2. Successful aspiration thrombectomy from the left anterior tibial vein to the left common iliac vein. 3. Successful placement of left common iliac to left external iliac self expanding uncovered venous stent (16 x 100 mm Abre). PLAN: 1. Therapeutic Lovenox for 1 month. 2. Left lower extremity compression stocking. 3. Follow-up in 1 month in IR clinic with CT venogram of the abdomen and pelvis, and left lower extremity venous duplex. Ruthann Cancer, MD [facility] Radiology Electronically Signed   By: Ruthann Cancer MD   On: 06/27/2021 07:55   ECHOCARDIOGRAM COMPLETE  Result Date: 06/27/2021    ECHOCARDIOGRAM REPORT   Patient Name:   [name] Date of Exam: 06/27/2021 Medical Rec #:  [id number]        Height:       63.0 in Accession #:    [id number]       Weight:       146.4 lb Date of Birth:  01/03/1951         BSA:          1.693 m Patient Age:    60 years         BP:           116/53 mmHg Patient Gender: F                HR:           92 bpm. Exam Location:  Inpatient Procedure: 2D Echo, Cardiac Doppler and Color Doppler Indications:    Pulmonary Embolus I26.09  History:        Patient has prior history of Echocardiogram examinations, most                 recent 01/16/2017. Arrythmias:Atrial Fibrillation.  Sonographer:    Bernadene Person RDCS Referring Phys: [id number] [place]  1. Left ventricular ejection fraction, by estimation, is 55 to 60%. The left ventricle has normal function. The left ventricle has no regional wall motion abnormalities. Left ventricular diastolic parameters were normal.  2. Right ventricular systolic function is normal. The right ventricular size is normal. There is normal pulmonary artery systolic pressure. The estimated right ventricular systolic pressure is 0000000 mmHg.  3. Left atrial size was mildly dilated.  4. The mitral valve is normal in structure. Mild mitral valve  regurgitation. No evidence of mitral stenosis.  5. The aortic valve is tricuspid. Aortic valve regurgitation is not visualized. No aortic stenosis is present.  6. Aortic dilatation noted. There is mild dilatation of the ascending aorta, measuring 38 mm.  7. The inferior vena cava is normal in size with greater than 50% respiratory variability, suggesting right atrial pressure of 3 mmHg. FINDINGS  Left Ventricle: Left ventricular ejection fraction, by estimation, is 55 to 60%. The left ventricle has normal function. The  left ventricle has no regional wall motion abnormalities. The left ventricular internal cavity size was normal in size. There is  no left ventricular hypertrophy. Left ventricular diastolic parameters were normal. Right Ventricle: The right ventricular size is normal. No increase in right ventricular wall thickness. Right ventricular systolic function is normal. There is normal pulmonary artery systolic pressure. The tricuspid regurgitant velocity is 2.44 m/s, and  with an assumed right atrial pressure of 3 mmHg, the estimated right ventricular systolic pressure is 0000000 mmHg. Left Atrium: Left atrial size was mildly dilated. Right Atrium: Right atrial size was normal in size. Pericardium: There is no evidence of pericardial effusion. Mitral Valve: The mitral valve is normal in structure. Mild mitral valve regurgitation. No evidence of mitral valve stenosis. Tricuspid Valve: The tricuspid valve is normal in structure. Tricuspid valve regurgitation is trivial. Aortic Valve: The aortic valve is tricuspid. Aortic valve regurgitation is not visualized. No aortic stenosis is present. Pulmonic Valve: The pulmonic valve was normal in structure. Pulmonic valve regurgitation is not visualized. Aorta: The aortic root is normal in size and structure and aortic dilatation noted. There is mild dilatation of the ascending aorta, measuring 38 mm. Venous: The inferior vena cava is normal in size with greater than 50%  respiratory variability, suggesting right atrial pressure of 3 mmHg. IAS/Shunts: No atrial level shunt detected by color flow Doppler.  LEFT VENTRICLE PLAX 2D LVIDd:         4.80 cm  Diastology LVIDs:         3.10 cm  LV e' medial:    8.11 cm/s LV PW:         0.80 cm  LV E/e' medial:  9.4 LV IVS:        1.00 cm  LV e' lateral:   12.60 cm/s LVOT diam:     2.00 cm  LV E/e' lateral: 6.1 LV SV:         59 LV SV Index:   35 LVOT Area:     3.14 cm  RIGHT VENTRICLE RV S prime:     20.80 cm/s TAPSE (M-mode): 2.6 cm LEFT ATRIUM             Index       RIGHT ATRIUM           Index LA diam:        3.90 cm 2.30 cm/m  RA Area:     14.50 cm LA Vol (A2C):   53.4 ml 31.53 ml/m RA Volume:   35.20 ml  20.79 ml/m LA Vol (A4C):   51.5 ml 30.41 ml/m LA Biplane Vol: 53.6 ml 31.65 ml/m  AORTIC VALVE LVOT Vmax:   112.00 cm/s LVOT Vmean:  66.800 cm/s LVOT VTI:    0.189 m  AORTA Ao Root diam: 3.10 cm Ao Asc diam:  3.80 cm MITRAL VALVE               TRICUSPID VALVE MV Area (PHT): 2.91 cm    TR Peak grad:   23.8 mmHg MV Decel Time: 261 msec    TR Vmax:        244.00 cm/s MR Peak grad: 74.3 mmHg MR Mean grad: 57.0 mmHg    SHUNTS MR Vmax:      431.00 cm/s  Systemic VTI:  0.19 m MR Vmean:     366.0 cm/s   Systemic Diam: 2.00 cm MV E velocity: 76.30 cm/s MV A velocity: 50.10 cm/s MV E/A ratio:  1.52 [employer]  MD Electronically signed by Loralie Champagne MD Signature Date/Time: 06/27/2021/4:55:36 PM    Final    CT VENOGRAM ABD/PEL  Result Date: 06/25/2021 CLINICAL DATA:  70 year old with left groin and calf pain 3 weeks after abdominoplasty surgery. Patient has DVT in the left lower extremity. CT venogram needed to evaluate for May-Thurner syndrome and left iliac venous thrombosis. EXAM: CT VENOGRAM ABDOMEN AND PELVIS TECHNIQUE: Multiplanar imaging of the abdomen and pelvis was obtained following administration of intravenous contrast. Venogram protocol was used. CONTRAST:  169m OMNIPAQUE IOHEXOL 350 MG/ML SOLN COMPARISON:  05/07/2012  FINDINGS: Lung bases: Linear densities at lung bases are suggestive for atelectasis. Limited evaluation for pulmonary emboli. Questionable small filling defect in the left lower lobe segmental or subsegmental pulmonary artery on sequence 5 image 2. Hepatobiliary: Slightly decreased attenuation of the liver. Normal appearance of the gallbladder. No suspicious hepatic lesion. Hepatic veins and portal venous system is patent. Pancreas: No evidence for acute inflammation or duct dilatation. Spleen: Normal size.  Single small calcification. Adrenals and kidneys: Normal adrenal glands. Negative for hydronephrosis. Small hypodensity in the posterior left kidney likely represents a cyst. No suspicious renal lesions. Normal appearance of the urinary bladder. Evaluation of bladder is limited due to right hip arthroplasty artifact. Stomach and bowel structures: Normal appearance of the stomach. Suspect previous appendectomy. No evidence for bowel inflammation or obstruction. Lymphatics: No abdominopelvic lymphadenopathy. Arterial structures: Atherosclerotic calcifications in the abdominal aorta without aneurysm. Main visceral arteries are patent. Venous structures: Thrombosis of the left common iliac vein and left external iliac vein. Proximal left femoral veins are occluded. Expansion and thrombosis of the proximal left great saphenous vein. Right common femoral vein is patent. Right iliac veins are patent. Small focus of thrombus at the junction of the IVC and left common iliac vein. Bilateral renal veins are patent. Incidentally, there is a low lying left retroaortic renal vein which is patent. Inflammatory changes around the proximal aspect of the left common iliac vein thrombosis. Other: Negative for ascites. Stranding in the anterior abdominal soft subcutaneous tissues compatible with recent abdominoplasty surgery. There are tiny low-density collections along the lateral abdominal wall, right side greater than left.  Right-sided small fluid collection measures roughly 6 mm in thickness. The left-sided collection measures roughly 5 mm in thickness. No evidence for intra-abdominal ascites. Negative for free air. Reproductive: Hypodense structure along the lower aspect the uterus measures roughly 2.0 cm and could represent a uterine fibroid. No evidence for an adnexal mass. Musculoskeletal: Right hip replacement is located. No acute bone abnormality. IMPRESSION: 1. Positive for deep venous thrombosis involving the left iliac venous system. 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 Inflammatory changes around the left common iliac vein thrombosis. Tiny focus of thrombus extends into the distal IVC at the IVC and left common iliac vein junction. Majority of the IVC is widely patent. 2. Question a tiny filling defect in a left lower lobe pulmonary artery. This study was not designed to evaluate for pulmonary embolism but cannot exclude small focus of pulmonary embolism at this location. This could be further characterized with a chest CTA. 3. Expected postsurgical changes from recent abdominoplasty surgery. Small fluid collections along the lateral lower abdomen bilaterally. These results were called by telephone at the time of interpretation on 06/25/2021 at 11:00 am [facility] verbally [facility] these results. Electronically Signed  By: Markus Daft M.D.   On: 06/25/2021 12:12   IR INTRAVASCULAR ULTRASOUND NON CORONARY  Result Date: 06/27/2021 INDICATION: 70 year old female with acute left lower extremity deep vein thrombosis extending from the calf veins to the left common iliac vein, suspicious for May-Thurner syndrome. History of recent abdominoplasty. EXAM: 1. Ultrasound-guided vascular access of the right greater saphenous vein. 2. Ultrasound-guided vascular access of  the left common femoral vein. 3. Left lower extremity venogram. 4. Mechanical aspiration thrombectomy of the left anterior tibial vein, left popliteal vein, left femoral vein, left common femoral vein, left external iliac vein, and left common iliac vein 5. Balloon venoplasty of the left common femoral vein. 6. Intravascular ultrasound. 7. Stent placement in the left common iliac vein with post stent deployment balloon molding. 8. Completion venogram. COMPARISON:  06/25/2021 MEDICATIONS: None. ANESTHESIA/SEDATION: Versed 3 mg IV; Fentanyl 150 mcg IV Moderate Sedation Time:  2 hours and 24 minutes The patient was continuously monitored during the procedure by the interventional radiology nurse under my direct supervision. FLUOROSCOPY TIME:  Fluoroscopy Time: 32 minutes 48 seconds (326 mGy). CONTRAST:  175 mL Omnipaque 300, intravenous COMPLICATIONS: None immediate. TECHNIQUE: Informed written consent was obtained from the patient after a thorough discussion of the procedural risks, benefits and alternatives. All questions were addressed. Maximal Sterile Barrier Technique was utilized including caps, mask, sterile gowns, sterile gloves, sterile drape, hand hygiene and skin antiseptic. A timeout was performed prior to the initiation of the procedure. The patient was positioned supine on the IR table. The right groin and left lower extremity were prepped and draped in standard fashion. Preprocedure ultrasound evaluation of the right common femoral and right greater saphenous vein demonstrated patency and compressibility. Access to the central right greater saphenous vein was planned. Subdermal Local anesthesia was provided with 1% lidocaine. A small skin nick was made. Under direct ultrasound visualization, the central right greater saphenous vein was accessed with a 21 gauge micropuncture needle. An image was captured and stored in the permanent record. Over the micropuncture wire, a micropuncture sheath was inserted.  A Wholey wire was then directed to the inferior vena cava and a 5 [place], 11 cm vascular sheath was placed. Baseline activated clotting time was measured and intravenous heparin boluses were administered throughout the procedure to obtain adequate anticoagulation. A 4 French omni Flush catheter was then directed to the peripheral aspect of the inferior vena cava. Limited inferior vena cavagram was performed which demonstrated patency and brisk antegrade flow. A Glidewire was then directed to the left common iliac vein, over which the Omni Flush catheter was exchanged for a 5 French Navicross catheter. The catheter and Glidewire were then directed to the left external iliac vein, however the common femoral vein was unable to be selected. Therefore, attention was turned to gaining antegrade access of the left lower extremity. Ultrasound evaluation of the left posterior tibial vein demonstrated patency with a single safe access point just superior to the level of the ankle. Local anesthesia was provided 1% lidocaine. Under direct ultrasound visualization, the left posterior tibial vein was attempted to be accessed, however vaso spasm occurred which prevented access. Therefore, the left common femoral vein was identified which demonstrated expansile, occlusive thrombus. Access was planned. Subdermal Local anesthesia was provided 1% lidocaine at the planned needle entry site. A small skin nick was made. Under direct ultrasound visualization, the right common femoral vein was accessed with a 21 gauge micropuncture needle. An image was captured and stored in the permanent record.  A 0.018 inch Nitrex wire was then inserted which was able to easily be passed to the level of the left common iliac vein. Micropuncture sheath was inserted and a Glidewire was passed to the level of the inferior vena cava. The indwelling right GSV 5 French sheath was then exchanged for a 33 cm 9 French vascular sheath. A 6 French, 15 mm loop snare  was then inserted to capture the Glidewire from the left-sided access to establish through and through access. From the right sheath, the 90 cross catheter was advanced over the Glidewire into the left common femoral vein, the wire was retracted into the left common femoral vein, thus allowing selection of the left femoral vein. The catheter and wire were directed through the left femoral vein in into the left anterior tibial vein. Left lower extremity venogram was then performed which demonstrated near complete occlusion of the entirety of the popliteal vein, femoral vein, profundus, common femoral vein, external iliac vein, and internal iliac vein. The catheter was removed over a Wholey wire. Computer-assisted suction thrombectomy was then performed with a 7 [place] Lightning Penumbra aspiration catheter with a single pass in a retrograde fashion over the indwelling wire. The wire was then removed and thrombectomy was performed in an antegrade fashion to the level of the external iliac vein. Copious acute appearing thrombus was aspirated into the canister. Due to the larger vessel size, the 7 French catheter was then exchanged for a 12 [place] Lightning Penumbra aspiration catheter after exchange of the indwelling 9 French sheath for an 9 French sheath, and thrombectomy was performed in a retrograde fashion over the wire to the level of the common femoral vein, then the wire was removed and additional aspiration was performed in antegrade fashion to the level of the iliac confluence. Additional copious acute appearing thrombus was aspirated into the canister. The Glidewire was then advanced once more to the popliteal vein and repeat left lower extremity venogram was performed. Venogram demonstrated significantly improved patency and antegrade flow throughout the popliteal, femoral, common femoral, and external iliac veins. There was high-grade, near obstructive stenosis in the left common iliac vein. There were  several rounded filling defects in the left common femoral vein compatible with residual thrombus. Therefore, balloon maceration was performed in the left common femoral vein with a 10 mm by 4 cm Conquest balloon. Limited repeat venogram demonstrated significantly improved patency inflow through the treated segment. Attention was then turned toward addressing the May-Thurner lesion. A 9 French vascular sheath was then inserted via the left common femoral vein access and directed to the level of the IVC. Intravascular ultrasound was performed of the left external and internal iliac veins which demonstrated severe, greater than 95% stenosis of the left common iliac vein at the level of the right common iliac artery. There was sluggish, near stagnant flow in the peripheral common iliac and left external iliac veins. Size measurements were obtained. Next, under direct fluoroscopic visualization, a 16 mm x 100 mm Medtronic Abre stent was deployed with the central portion at the level of the common iliac confluence, extending into the central external iliac vein. Post appointment balloon molding was performed with a 16 mm x 4 cm Atlas balloon. Intravascular ultrasound evaluation was again performed which demonstrated significantly improved patency of the left iliac veins with excellent wall apposition of the indwelling stent. The stent appeared well positioned with the medial aspect of the central and of the stent at the level of the iliac confluence, with  minimal overhang into the inferior vena cava. Completion venogram left lower extremity was performed which demonstrated patency and brisk antegrade flow through the iliac veins and stent. The indwelling sheaths were removed and hemostasis was achieved with manual pressure. Bandages were applied. A single therapeutic dose of Lovenox was administered prior to departing the IR suite. The patient tolerated the procedure well without immediate complication. FINDINGS: Acute  deep vein thrombosis extending from the calf veins to the left common iliac vein. Severe focal stenosis in the common left iliac vein. IMPRESSION: 1. Acute deep vein thrombosis extending from the left common iliac vein to the left calf veins secondary to May-Thurner lesion. 2. Successful aspiration thrombectomy from the left anterior tibial vein to the left common iliac vein. 3. Successful placement of left common iliac to left external iliac self expanding uncovered venous stent (16 x 100 mm Abre). PLAN: 1. Therapeutic Lovenox for 1 month. 2. Left lower extremity compression stocking. 3. Follow-up in 1 month in IR clinic with CT venogram of the abdomen and pelvis, and left lower extremity venous duplex. Ruthann Cancer, MD [facility] Rad[facility] Electronically Signed   By: Ruthann Cancer MD   On: 06/27/2021 07:55   IR TRANSCATH PLC STENT 1ST ART NOT LE CV CAR VERT CAR  Result Date: 06/27/2021 INDICATION: 70 year old female with acute left lower extremity deep vein thrombosis extending from the calf veins to the left common iliac vein, suspicious for May-Thurner syndrome. History of recent abdominoplasty. EXAM: 1. Ultrasound-guided vascular access of the right greater saphenous vein. 2. Ultrasound-guided vascular access of the left common femoral vein. 3. Left lower extremity venogram. 4. Mechanical aspiration thrombectomy of the left anterior tibial vein, left popliteal vein, left femoral vein, left common femoral vein, left external iliac vein, and left common iliac vein 5. Balloon venoplasty of the left common femoral vein. 6. Intravascular ultrasound. 7. Stent placement in the left common iliac vein with post stent deployment balloon molding. 8. Completion venogram. COMPARISON:  06/25/2021 MEDICATIONS: None. ANESTHESIA/SEDATION: Versed 3 mg IV; Fentanyl 150 mcg IV Moderate Sedation Time:  2 hours and 24 minutes The patient was continuously monitored during the  procedure by the interventional radiology nurse under my direct supervision. FLUOROSCOPY TIME:  Fluoroscopy Time: 32 minutes 48 seconds (326 mGy). CONTRAST:  175 mL Omnipaque 300, intravenous COMPLICATIONS: None immediate. TECHNIQUE: Informed written consent was obtained from the patient after a thorough discussion of the procedural risks, benefits and alternatives. All questions were addressed. Maximal Sterile Barrier Technique was utilized including caps, mask, sterile gowns, sterile gloves, sterile drape, hand hygiene and skin antiseptic. A timeout was performed prior to the initiation of the procedure. The patient was positioned supine on the IR table. The right groin and left lower extremity were prepped and draped in standard fashion. Preprocedure ultrasound evaluation of the right common femoral and right greater saphenous vein demonstrated patency and compressibility. Access to the central right greater saphenous vein was planned. Subdermal Local anesthesia was provided with 1% lidocaine. A small skin nick was made. Under direct ultrasound visualization, the central right greater saphenous vein was accessed with a 21 gauge micropuncture needle. An image was captured and stored in the permanent record. Over the micropuncture wire, a micropuncture sheath was inserted. A Wholey wire was then directed to the inferior vena cava and a 5 [place], 11 cm vascular sheath was placed. Baseline activated clotting time was measured and intravenous heparin boluses were administered throughout the procedure to obtain adequate anticoagulation.  A 4 French omni Flush catheter was then directed to the peripheral aspect of the inferior vena cava. Limited inferior vena cavagram was performed which demonstrated patency and brisk antegrade flow. A Glidewire was then directed to the left common iliac vein, over which the Omni Flush catheter was exchanged for a 5 French Navicross catheter. The catheter and Glidewire were then directed  to the left external iliac vein, however the common femoral vein was unable to be selected. Therefore, attention was turned to gaining antegrade access of the left lower extremity. Ultrasound evaluation of the left posterior tibial vein demonstrated patency with a single safe access point just superior to the level of the ankle. Local anesthesia was provided 1% lidocaine. Under direct ultrasound visualization, the left posterior tibial vein was attempted to be accessed, however vaso spasm occurred which prevented access. Therefore, the left common femoral vein was identified which demonstrated expansile, occlusive thrombus. Access was planned. Subdermal Local anesthesia was provided 1% lidocaine at the planned needle entry site. A small skin nick was made. Under direct ultrasound visualization, the right common femoral vein was accessed with a 21 gauge micropuncture needle. An image was captured and stored in the permanent record. A 0.018 inch Nitrex wire was then inserted which was able to easily be passed to the level of the left common iliac vein. Micropuncture sheath was inserted and a Glidewire was passed to the level of the inferior vena cava. The indwelling right GSV 5 French sheath was then exchanged for a 33 cm 9 French vascular sheath. A 6 French, 15 mm loop snare was then inserted to capture the Glidewire from the left-sided access to establish through and through access. From the right sheath, the 90 cross catheter was advanced over the Glidewire into the left common femoral vein, the wire was retracted into the left common femoral vein, thus allowing selection of the left femoral vein. The catheter and wire were directed through the left femoral vein in into the left anterior tibial vein. Left lower extremity venogram was then performed which demonstrated near complete occlusion of the entirety of the popliteal vein, femoral vein, profundus, common femoral vein, external iliac vein, and internal iliac  vein. The catheter was removed over a Wholey wire. Computer-assisted suction thrombectomy was then performed with a 7 [place] Lightning Penumbra aspiration catheter with a single pass in a retrograde fashion over the indwelling wire. The wire was then removed and thrombectomy was performed in an antegrade fashion to the level of the external iliac vein. Copious acute appearing thrombus was aspirated into the canister. Due to the larger vessel size, the 7 French catheter was then exchanged for a 12 [place] Lightning Penumbra aspiration catheter after exchange of the indwelling 9 French sheath for an 24 French sheath, and thrombectomy was performed in a retrograde fashion over the wire to the level of the common femoral vein, then the wire was removed and additional aspiration was performed in antegrade fashion to the level of the iliac confluence. Additional copious acute appearing thrombus was aspirated into the canister. The Glidewire was then advanced once more to the popliteal vein and repeat left lower extremity venogram was performed. Venogram demonstrated significantly improved patency and antegrade flow throughout the popliteal, femoral, common femoral, and external iliac veins. There was high-grade, near obstructive stenosis in the left common iliac vein. There were several rounded filling defects in the left common femoral vein compatible with residual thrombus. Therefore, balloon maceration was performed in the left common femoral vein  with a 10 mm by 4 cm Conquest balloon. Limited repeat venogram demonstrated significantly improved patency inflow through the treated segment. Attention was then turned toward addressing the May-Thurner lesion. A 9 French vascular sheath was then inserted via the left common femoral vein access and directed to the level of the IVC. Intravascular ultrasound was performed of the left external and internal iliac veins which demonstrated severe, greater than 95% stenosis of the  left common iliac vein at the level of the right common iliac artery. There was sluggish, near stagnant flow in the peripheral common iliac and left external iliac veins. Size measurements were obtained. Next, under direct fluoroscopic visualization, a 16 mm x 100 mm Medtronic Abre stent was deployed with the central portion at the level of the common iliac confluence, extending into the central external iliac vein. Post appointment balloon molding was performed with a 16 mm x 4 cm Atlas balloon. Intravascular ultrasound evaluation was again performed which demonstrated significantly improved patency of the left iliac veins with excellent wall apposition of the indwelling stent. The stent appeared well positioned with the medial aspect of the central and of the stent at the level of the iliac confluence, with minimal overhang into the inferior vena cava. Completion venogram left lower extremity was performed which demonstrated patency and brisk antegrade flow through the iliac veins and stent. The indwelling sheaths were removed and hemostasis was achieved with manual pressure. Bandages were applied. A single therapeutic dose of Lovenox was administered prior to departing the IR suite. The patient tolerated the procedure well without immediate complication. FINDINGS: Acute deep vein thrombosis extending from the calf veins to the left common iliac vein. Severe focal stenosis in the common left iliac vein. IMPRESSION: 1. Acute deep vein thrombosis extending from the left common iliac vein to the left calf veins secondary to May-Thurner lesion. 2. Successful aspiration thrombectomy from the left anterior tibial vein to the left common iliac vein. 3. Successful placement of left common iliac to left external iliac self expanding uncovered venous stent (16 x 100 mm Abre). PLAN: 1. Therapeutic Lovenox for 1 month. 2. Left lower extremity compression stocking. 3. Follow-up in 1 month in IR clinic with CT venogram of the  abdomen and pelvis, and left lower extremity venous duplex. Ruthann Cancer, MD [facility] Radiology Electronically Signed   By: Ruthann Cancer MD   On: 06/27/2021 07:55   VAS [place] LOWER EXTREMITY VENOUS (DVT) (ONLY [facility] & [facility])  Result Date: 06/25/2021  Lower Venous DVT Study Patient Name:  SHAMEIA BURFIELD  Date of Exam:   06/25/2021 Medical Rec #: [id number]         Accession #:    [id number] Date of Birth: Mar 31, 1951          Patient Gender: F Patient Age:   70Y Exam Location:  [facility] Procedure:      VAS [place] LOWER EXTREMITY VENOUS (DVT) Referring Phys: [id number] JON KNAPP --------------------------------------------------------------------------------  Indications: Pain. Other Indications: Left groin pain and calf pain 3 weeks post tummy tuck. Comparison Study: No previous exams Performing Technologist: Jody Hill RVT, RDMS  Examination Guidelines: A complete evaluation includes B-mode imaging, spectral Doppler, color Doppler, and power Doppler as needed of all accessible portions of each vessel. Bilateral testing is considered an integral part of a complete examination. Limited examinations for reoccurring indications may be performed as noted. The reflux portion of the exam is performed with the patient in reverse Trendelenburg.  +-----+---------------+---------+-----------+----------+--------------+  RIGHTCompressibilityPhasicitySpontaneityPropertiesThrombus Aging +-----+---------------+---------+-----------+----------+--------------+ CFV  Full           Yes      Yes                                 +-----+---------------+---------+-----------+----------+--------------+   +---------+---------------+---------+-----------+----------+--------------+ LEFT     CompressibilityPhasicitySpontaneityPropertiesThrombus Aging +---------+---------------+---------+-----------+----------+--------------+ CFV      None           No       No                    Acute          +---------+---------------+---------+-----------+----------+--------------+ SFJ      None                                         Acute          +---------+---------------+---------+-----------+----------+--------------+ FV Prox  None           No       No                   Acute          +---------+---------------+---------+-----------+----------+--------------+ FV Mid   None           No       No                   Acute          +---------+---------------+---------+-----------+----------+--------------+ FV DistalNone           No       No                   Acute          +---------+---------------+---------+-----------+----------+--------------+ PFV      None           No       No                   Acute          +---------+---------------+---------+-----------+----------+--------------+ POP      None           No       No                   Acute          +---------+---------------+---------+-----------+----------+--------------+ PTV      Full                                                        +---------+---------------+---------+-----------+----------+--------------+ PERO     None           No       No                   Acute          +---------+---------------+---------+-----------+----------+--------------+ Gastroc  None           No       No                   Acute          +---------+---------------+---------+-----------+----------+--------------+  GSV      None           No       No                   Acute          +---------+---------------+---------+-----------+----------+--------------+ Unable to image iliac system due to bandage from surgery.    Summary: RIGHT: - No evidence of common femoral vein obstruction.  LEFT: - Findings consistent with acute deep vein thrombosis involving the left common femoral vein, SF junction, left femoral vein, left proximal profunda vein, left popliteal vein, left peroneal veins, and  left gastrocnemius veins. - Findings consistent with acute superficial vein thrombosis involving the left great saphenous vein. - No cystic structure found in the popliteal fossa.  *See table(s) above for measurements and observations. Electronically signed by Deitra Mayo MD on 06/25/2021 at 5:04:44 PM.    Final     Labs:  CBC: Recent Labs    06/26/21 0113 06/27/21 0016 06/27/21 1634 06/28/21 0019  WBC 10.7* 10.4 10.3 9.2  HGB 10.7* 8.8* 8.8* 7.8*  HCT 33.3* 28.1* 27.0* 24.5*  PLT 176 166 186 173    COAGS: Recent Labs    06/25/21 0805  INR 1.1    BMP: Recent Labs    06/25/21 0805 06/26/21 0113 06/27/21 0016 06/28/21 0817  NA 139 139 137 139  K 4.3 3.6 3.8 3.7  CL 103 104 105 107  CO2 '26 24 25 24  '$ GLUCOSE 124* 116* 149* 162*  BUN '21 15 19 9  '$ CALCIUM 9.2 8.7* 7.8* 8.0*  CREATININE 0.75 0.74 0.90 0.63  GFRNONAA >60 >60 >60 >60    LIVER FUNCTION TESTS: No results for input(s): BILITOT, AST, ALT, ALKPHOS, PROT, ALBUMIN in the last 8760 hours.  Assessment and Plan:  May-Thurner Syndrome - S/P suction thrombectomy from the left common iliac to anterior tibial vein with balloon venoplasty of left common femoral vein and stent placement in left common iliac vein By Dr. Serafina Royals on 06/26/21.  Patient is to wear 20-30 mmHg left thigh high compression stocking at all times after discharge unless sleeping  Ambulate often.  70 mg Lovenox BID for one month  No barriers to discharge from IR standpoint  Order in place for IR clinic in 1 month with CTV abdomen/pelvis and LLE venous duplex study.  Electronically Signed: Murrell Redden, PA-C 06/28/2021, 11:11 AM    I spent a total of 15 Minutes at the the patient's bedside AND on the patient's hospital floor or unit, greater than 50% of which was counseling/coordinating care for f/u DVT lysis, Stent.

## 2021-06-28 NOTE — Progress Notes (Signed)
RN removed PIVs and went over pt d/c summary with pt and her spouse. Belongings with pt, including meds and supplies from [facility]. Pt's husband to transport pt home in private vehicle. NT transporting pt to vehicle.

## 2021-06-30 ENCOUNTER — Telehealth: Payer: Self-pay | Admitting: Interventional Radiology

## 2021-06-30 NOTE — Telephone Encounter (Signed)
Mrs. Ludden calls with concern of a painful spot on her posterior left knee.  There is no associated redness or swelling, it is only tender when she presses there.  No fevers, chills, edema, chest pain, shortness of breath.  I reassured her that we initiated attempt at popliteal vein access during her procedure then aborted that plan in favor of left CFV access, but did make small skin nick and this is likely what she is describing.  I recommended cool compresses and OTC pain medications as needed, and to call back if not improved over this next week.  She is overall doing excellent, returned to baseline, with no lower extremity symptoms.   Will follow up as scheduled in IR clinic in 1 month.   Ruthann Cancer, MD Pager: [phone]

## 2021-07-02 ENCOUNTER — Encounter: Payer: Self-pay | Admitting: Family Medicine

## 2021-07-02 ENCOUNTER — Other Ambulatory Visit: Payer: Self-pay | Admitting: *Deleted

## 2021-07-02 MED ORDER — PROMETHAZINE HCL 25 MG PO TABS: ORAL_TABLET | ORAL | 1 refills | Status: DC

## 2021-07-04 ENCOUNTER — Telehealth: Payer: Self-pay | Admitting: Physician Assistant

## 2021-07-04 DIAGNOSIS — Z7901 Long term (current) use of anticoagulants: Secondary | ICD-10-CM | POA: Diagnosis not present

## 2021-07-04 DIAGNOSIS — K2960 Other gastritis without bleeding: Secondary | ICD-10-CM | POA: Diagnosis not present

## 2021-07-04 DIAGNOSIS — D649 Anemia, unspecified: Secondary | ICD-10-CM | POA: Diagnosis not present

## 2021-07-04 NOTE — Telephone Encounter (Signed)
  Christina [name] is s/p left leg venous thrombectomy and Iliac vein stent secondary to May-Thurner syndrome. This was done by Dr. Serafina Royals on 06/27/21.  She called today c/o her knee still hurts, but not enough to take anything.   She says it's has NOT gotten worse, but it hasn't gone away either.   She is compliant with her Lovenox, has not missed a dose. She is ambulating often and wearing compression.  She was worried she has another clot  I explained it would be rare to deveop another DVT on the Lovenox.   I discussed with Dr. Serafina Royals. He did make a skin nick at the popliteal fossa, but did NOT access the vein. He actually accessed both groins.  He confirmed that development of another DVT would be quite rare on the Lovenox and given she is ambulating often and wearing her compression.  I re-assured her that as long as her discomfort was not getting worse and she is not developing increasing swelling, that she does not need additional imaging at this time.  She will have F/U appointment with Dr. Serafina Royals on 08/14/21 with doppler and CT venogram.  [facility] Avalina Benko PA-C 07/04/2021 11:37 AM

## 2021-07-05 ENCOUNTER — Other Ambulatory Visit: Payer: Self-pay

## 2021-07-05 ENCOUNTER — Ambulatory Visit (INDEPENDENT_AMBULATORY_CARE_PROVIDER_SITE_OTHER): Payer: Medicare Other | Admitting: Physician Assistant

## 2021-07-05 DIAGNOSIS — Z719 Counseling, unspecified: Secondary | ICD-10-CM

## 2021-07-05 NOTE — Progress Notes (Addendum)
Patient is a 70 year old female s/p mini abdominoplasty with liposuction performed 06/03/2021 with Dr. Marla Roe.  Medical history significant for atrial fibrillation s/p ablation and placement of implantable loop recorder.  During most recent postoperative encounter here in the office 06/11/2021 she appeared well and was recommended to transition to spanks and return for another office appointment 1 week later.  On 06/25/2021 she presented to the ED with complaints of left-sided groin swelling and pain and was ultimately diagnosed with left-sided DVT with extensive clot burden for which she underwent thrombectomy.  She was admitted to the hospital and started on heparin drip.  CTA demonstrated bilateral subsegmental pulmonary embolism.  Plan at time of discharge was for transition from Lovenox to [place].  She was also noted to have acute blood loss anemia related to thrombectomy with Hgb down as low as 8.4 for which she was started on oral iron replacement.  On my examination today, patient is doing well and without complaints.  She is wearing compression stockings bilaterally and is continue with Lovenox injections at home.  She anticipates transition to Eliquis soon.  She is meeting with Dr. Serafina Royals next week for ongoing management of her blood clots.  She also will follow up with her primary care provider.  She denies any chest pain, shortness of breath, or palpitations.  With regard to her mini abdominoplasty with liposuction, she denies any abdominal pain, fevers or chills, or other systemic symptoms.  She has not yet removed the dressings.  She continues to wear spanks.  She asked when she can discontinue the spanks and I advised her to continue wearing them for another few weeks.  I removed the dressing here in clinic.  The Steri-Strips and absorbable sutures also came off with dressing removal.  The incision appears intact, clean, and without surrounding erythema or induration concerning for cellulitis.   She does have mild fullness in lower abdomen which I informed her was a normal postop finding that should improve with time.  She denies any pain or tenderness on my exam.  Picture was obtained.  She will follow up here in clinic in 3 weeks.  Encouraged her to call should she develop any additional questions or new concerns.

## 2021-07-08 ENCOUNTER — Telehealth (HOSPITAL_COMMUNITY): Payer: Self-pay | Admitting: Pharmacist

## 2021-07-08 DIAGNOSIS — M810 Age-related osteoporosis without current pathological fracture: Secondary | ICD-10-CM | POA: Diagnosis not present

## 2021-07-08 DIAGNOSIS — D649 Anemia, unspecified: Secondary | ICD-10-CM | POA: Diagnosis not present

## 2021-07-08 DIAGNOSIS — Z7901 Long term (current) use of anticoagulants: Secondary | ICD-10-CM | POA: Diagnosis not present

## 2021-07-08 DIAGNOSIS — E559 Vitamin D deficiency, unspecified: Secondary | ICD-10-CM | POA: Diagnosis not present

## 2021-07-08 DIAGNOSIS — K2960 Other gastritis without bleeding: Secondary | ICD-10-CM | POA: Diagnosis not present

## 2021-07-08 NOTE — Telephone Encounter (Signed)
Pharmacy Transitions of Care Follow-up Telephone Call  Date of discharge: 06/28/2021  Discharge Diagnosis: DVT  How have you been since you were released from the hospital? Good   Medication changes made at discharge:  - START: Enoxaparin, Iron  - STOPPED: Losartan  - CHANGED: NA  Medication changes verified by the patient? Yes (Yes/No)    Medication Accessibility:  Was the patient provided with refills on discharged medications? No   Is the patient able to afford medications? No issues  Medication Review:  ENOXAPARIN - Please verify dosing and anticipated length of therapy. Read discharge note to confirm date of completion and verify this with the patien  Follow-up Appointments:  If their condition worsens, is the pt aware to call PCP or go to the Emergency Dept.? Yes  Final Patient Assessment: Patient reports she is doing well and has no additional questions regarding her medication.

## 2021-07-10 DIAGNOSIS — Z20822 Contact with and (suspected) exposure to covid-19: Secondary | ICD-10-CM | POA: Diagnosis not present

## 2021-07-17 NOTE — Progress Notes (Signed)
Carelink Summary Report / Loop Recorder 

## 2021-07-22 DIAGNOSIS — I480 Paroxysmal atrial fibrillation: Secondary | ICD-10-CM | POA: Diagnosis not present

## 2021-07-22 DIAGNOSIS — E7800 Pure hypercholesterolemia, unspecified: Secondary | ICD-10-CM | POA: Diagnosis not present

## 2021-07-22 DIAGNOSIS — I10 Essential (primary) hypertension: Secondary | ICD-10-CM | POA: Diagnosis not present

## 2021-07-23 ENCOUNTER — Other Ambulatory Visit: Payer: Self-pay | Admitting: Interventional Radiology

## 2021-07-23 DIAGNOSIS — K2960 Other gastritis without bleeding: Secondary | ICD-10-CM | POA: Diagnosis not present

## 2021-07-23 DIAGNOSIS — I82402 Acute embolism and thrombosis of unspecified deep veins of left lower extremity: Secondary | ICD-10-CM

## 2021-07-23 NOTE — Progress Notes (Signed)
Patient is a 70 year old female s/p mini abdominoplasty with liposuction performed 06/03/2021 with Dr. Marla Roe.  Medical history significant for atrial fibrillation s/p ablation and placement of implantable loop recorder.  On 06/25/2021 she presented to the ED with complaints of left-sided groin swelling and pain and was ultimately diagnosed with left-sided DVT with extensive clot burden for which she underwent thrombectomy.  She was admitted to the hospital and started on heparin drip.  CTA demonstrated bilateral subsegmental pulmonary embolism.  Plan at time of discharge was for transition from Lovenox to [place].  She was also noted to have acute blood loss anemia related to thrombectomy with Hgb down as low as 8.4 for which she was started on oral iron replacement.  She was seen most recently here in the office on 07/05/2021.  At that time, she was doing well without any significant complaints.  She was being transitioned to Eliquis, managed by Dr. Serafina Royals.  She was continue to wear spanks and denied any abdominal pain, fevers or chills, or other systemic symptoms.  Her dressing and Steri-Strips were removed.  Her incision appeared intact, clean, and without any evidence concerning for cellulitis or dehiscence.  She did have normal postoperative "fullness" in the lower abdomen.  She was nontender.  On my exam today, patient is satisfied with her continued wound healing.  She denies any significant abdominal complaints.  She is continuing to use Lovenox injections and anticipates transition to Eliquis in the next week.  She will follow-up with her primary care provider as well as her surgeon.  She tells me that she was diagnosed with May-Thurner syndrome which likely contributed to her DVT formation.  We discussed that she is safe to resume normal activity.  No significant restrictions at this time given that she is at least 7 weeks postop.  She states that she has already stopped wearing her spanks and have  transition to normal underwear.  She does have some firmness/knot formation, but they are directly underneath the bruising on her abdomen secondary to Lovenox injections.  There is no significant not formation or firmness in suprapubic area or along incision.  She denied any recent fevers, chills, redness, chest pain or shortness of breath, or other concerning history.  There is no evidence of wound dehiscence or cellulitic changes along incision site.  We discussed scar cream.  She will proceed with Mederma rather than Skinuva at this time.    At this time, she is informed that she can follow-up with our clinic as needed.  She will not book an appointment at this time, but will call us back should she have any future concerns or questions.     Any pictures obtained of the patient and placed in the chart were with the patient's or guardian's permission.

## 2021-07-26 ENCOUNTER — Other Ambulatory Visit (HOSPITAL_COMMUNITY): Payer: Self-pay | Admitting: Physician Assistant

## 2021-07-26 ENCOUNTER — Encounter: Payer: Self-pay | Admitting: Physician Assistant

## 2021-07-26 ENCOUNTER — Ambulatory Visit (INDEPENDENT_AMBULATORY_CARE_PROVIDER_SITE_OTHER): Payer: Medicare Other

## 2021-07-26 ENCOUNTER — Ambulatory Visit (INDEPENDENT_AMBULATORY_CARE_PROVIDER_SITE_OTHER): Payer: Self-pay | Admitting: Physician Assistant

## 2021-07-26 ENCOUNTER — Other Ambulatory Visit: Payer: Self-pay | Admitting: Interventional Radiology

## 2021-07-26 ENCOUNTER — Other Ambulatory Visit (HOSPITAL_COMMUNITY): Payer: Self-pay | Admitting: Interventional Radiology

## 2021-07-26 ENCOUNTER — Other Ambulatory Visit: Payer: Self-pay

## 2021-07-26 DIAGNOSIS — Z9889 Other specified postprocedural states: Secondary | ICD-10-CM

## 2021-07-26 DIAGNOSIS — I480 Paroxysmal atrial fibrillation: Secondary | ICD-10-CM

## 2021-07-26 MED ORDER — APIXABAN 5 MG PO TABS: 5.0000 mg | ORAL_TABLET | Freq: Two times a day (BID) | ORAL | 6 refills | Status: DC

## 2021-07-30 LAB — CUP PACEART REMOTE DEVICE CHECK
Date Time Interrogation Session: 20220831095748
Implantable Pulse Generator Implant Date: 20210902

## 2021-07-31 ENCOUNTER — Other Ambulatory Visit (HOSPITAL_COMMUNITY): Payer: Self-pay | Admitting: Nurse Practitioner

## 2021-08-01 DIAGNOSIS — K2960 Other gastritis without bleeding: Secondary | ICD-10-CM | POA: Diagnosis not present

## 2021-08-01 DIAGNOSIS — Z7901 Long term (current) use of anticoagulants: Secondary | ICD-10-CM | POA: Diagnosis not present

## 2021-08-01 DIAGNOSIS — D649 Anemia, unspecified: Secondary | ICD-10-CM | POA: Diagnosis not present

## 2021-08-02 DIAGNOSIS — E7800 Pure hypercholesterolemia, unspecified: Secondary | ICD-10-CM | POA: Diagnosis not present

## 2021-08-02 DIAGNOSIS — I10 Essential (primary) hypertension: Secondary | ICD-10-CM | POA: Diagnosis not present

## 2021-08-02 DIAGNOSIS — M810 Age-related osteoporosis without current pathological fracture: Secondary | ICD-10-CM | POA: Diagnosis not present

## 2021-08-02 DIAGNOSIS — I480 Paroxysmal atrial fibrillation: Secondary | ICD-10-CM | POA: Diagnosis not present

## 2021-08-02 MED ORDER — APIXABAN 5 MG PO TABS: 5.0000 mg | ORAL_TABLET | Freq: Two times a day (BID) | ORAL | 6 refills | Status: DC

## 2021-08-06 NOTE — Progress Notes (Signed)
Carelink Summary Report / Loop Recorder 

## 2021-08-12 ENCOUNTER — Ambulatory Visit (INDEPENDENT_AMBULATORY_CARE_PROVIDER_SITE_OTHER): Payer: Medicare Other | Admitting: Internal Medicine

## 2021-08-12 ENCOUNTER — Encounter: Payer: Self-pay | Admitting: Family Medicine

## 2021-08-12 ENCOUNTER — Other Ambulatory Visit: Payer: Self-pay

## 2021-08-12 VITALS — BP 132/78 | HR 101 | Ht 62.0 in | Wt 142.8 lb

## 2021-08-12 DIAGNOSIS — I480 Paroxysmal atrial fibrillation: Secondary | ICD-10-CM | POA: Diagnosis not present

## 2021-08-12 DIAGNOSIS — I10 Essential (primary) hypertension: Secondary | ICD-10-CM

## 2021-08-12 DIAGNOSIS — I4892 Unspecified atrial flutter: Secondary | ICD-10-CM
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
## 2021-08-12 NOTE — Patient Instructions (Signed)
Medication Instructions:  Your physician recommends that you continue on your current medications as directed. Please refer to the Current Medication list given to you today.  Labwork: None ordered.  Testing/Procedures: None ordered.  Follow-Up: Your physician wants you to follow-up in: 12 months with the Afib Clinic. They will contact you to schedule.   Any Other Special Instructions Will Be Listed Below (If Applicable).  If you need a refill on your cardiac medications before your next appointment, please call your pharmacy.

## 2021-08-15 ENCOUNTER — Other Ambulatory Visit: Payer: Self-pay | Admitting: Physician Assistant

## 2021-08-15 DIAGNOSIS — K2960 Other gastritis without bleeding: Secondary | ICD-10-CM

## 2021-08-16 ENCOUNTER — Encounter (HOSPITAL_COMMUNITY): Payer: Self-pay

## 2021-08-16 ENCOUNTER — Other Ambulatory Visit: Payer: Medicare Other

## 2021-08-16 ENCOUNTER — Ambulatory Visit (HOSPITAL_COMMUNITY)
Admission: RE | Admit: 2021-08-16 | Discharge: 2021-08-16 | Disposition: A | Discharge status: Home / Self Care | Payer: Medicare Other | Source: Ambulatory Visit | Attending: Interventional Radiology | Admitting: Interventional Radiology

## 2021-08-16 DIAGNOSIS — I82402 Acute embolism and thrombosis of unspecified deep veins of left lower extremity: Secondary | ICD-10-CM | POA: Diagnosis not present

## 2021-08-16 LAB — POCT I-STAT CREATININE: Creatinine, Ser: 0.9 mg/dL (ref 0.44–1.00)

## 2021-08-16 MED ORDER — IOHEXOL 350 MG/ML SOLN
100.0000 mL | Freq: Once | INTRAVENOUS | Status: AC | PRN
  Administered 2021-08-16: 100 mL via INTRAVENOUS

## 2021-08-20 ENCOUNTER — Ambulatory Visit
Admission: RE | Admit: 2021-08-20 | Discharge: 2021-08-20 | Disposition: A | Discharge status: Home / Self Care | Payer: Medicare Other | Source: Ambulatory Visit | Attending: Interventional Radiology | Admitting: Interventional Radiology

## 2021-08-20 ENCOUNTER — Ambulatory Visit
Admission: RE | Admit: 2021-08-20 | Discharge: 2021-08-20 | Disposition: A | Discharge status: Home / Self Care | Payer: Medicare Other | Source: Ambulatory Visit | Attending: Radiology | Admitting: Radiology

## 2021-08-20 ENCOUNTER — Encounter: Payer: Self-pay | Admitting: *Deleted

## 2021-08-20 DIAGNOSIS — Z9889 Other specified postprocedural states: Secondary | ICD-10-CM | POA: Diagnosis not present

## 2021-08-20 DIAGNOSIS — I82402 Acute embolism and thrombosis of unspecified deep veins of left lower extremity: Secondary | ICD-10-CM | POA: Diagnosis not present

## 2021-08-20 DIAGNOSIS — I871 Compression of vein: Secondary | ICD-10-CM | POA: Diagnosis not present

## 2021-08-20 HISTORY — PX: IR RADIOLOGIST EVAL & MGMT: IMG5224
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
## 2021-08-22 ENCOUNTER — Other Ambulatory Visit (HOSPITAL_COMMUNITY): Payer: Self-pay | Admitting: Nurse Practitioner

## 2021-08-23 ENCOUNTER — Ambulatory Visit
Admission: RE | Admit: 2021-08-23 | Discharge: 2021-08-23 | Disposition: A | Discharge status: Home / Self Care | Payer: Medicare Other | Source: Ambulatory Visit | Attending: Physician Assistant | Admitting: Physician Assistant

## 2021-08-23 ENCOUNTER — Other Ambulatory Visit: Payer: Self-pay

## 2021-08-23 DIAGNOSIS — K2970 Gastritis, unspecified, without bleeding: Secondary | ICD-10-CM | POA: Diagnosis not present

## 2021-08-23 DIAGNOSIS — K219 Gastro-esophageal reflux disease without esophagitis: Secondary | ICD-10-CM | POA: Diagnosis not present

## 2021-08-23 DIAGNOSIS — K224 Dyskinesia of esophagus: Secondary | ICD-10-CM | POA: Diagnosis not present

## 2021-08-23 DIAGNOSIS — K2960 Other gastritis without bleeding: Secondary | ICD-10-CM

## 2021-08-29 LAB — CUP PACEART REMOTE DEVICE CHECK
Date Time Interrogation Session: 20221003095746
Implantable Pulse Generator Implant Date: 20210902

## 2021-09-02 ENCOUNTER — Ambulatory Visit (INDEPENDENT_AMBULATORY_CARE_PROVIDER_SITE_OTHER): Payer: Medicare Other

## 2021-09-02 DIAGNOSIS — I480 Paroxysmal atrial fibrillation: Secondary | ICD-10-CM

## 2021-09-06 ENCOUNTER — Other Ambulatory Visit: Payer: Self-pay | Admitting: *Deleted

## 2021-09-06 MED ORDER — METHYLPREDNISOLONE 4 MG PO TBPK: ORAL_TABLET | ORAL | 0 refills | Status: DC

## 2021-09-07 ENCOUNTER — Other Ambulatory Visit (HOSPITAL_COMMUNITY): Payer: Self-pay | Admitting: Nurse Practitioner

## 2021-09-10 NOTE — Progress Notes (Signed)
Carelink Summary Report / Loop Recorder 

## 2021-09-12 DIAGNOSIS — J029 Acute pharyngitis, unspecified: Secondary | ICD-10-CM | POA: Diagnosis not present

## 2021-09-12 DIAGNOSIS — K2960 Other gastritis without bleeding: Secondary | ICD-10-CM | POA: Diagnosis not present

## 2021-09-12 DIAGNOSIS — D649 Anemia, unspecified: Secondary | ICD-10-CM | POA: Diagnosis not present

## 2021-09-12 NOTE — Progress Notes (Signed)
09/16/2021 ALL: She returns for Botox. She is having more frequent and intense migraines. Tummy tuck over the summer then DVTs. Hemoglobin dropped and had GI eval. More stress. About 4-5 migraines a month but some toward end of Emgality/Botox cycle are severe and intractable. Medrol dose pack did not help much last week. Nurtec and naratriptan do help some. Tried and failed topiramate, nortriptyline, propranolol, metoprolol, Amovig, Emgality, Ajovy, gabapentin, sumatriptan, rizatriptan, Botox. We will try Qulipta 60mg  daily.  06/12/2021 ALL: She returns for Botox procedure. She had difficulty with an intractable migraines that lasted for over a week. It was finally aborted with migraine cocktail and steroid taper. She usually has about 1 migraine per month that is easily aborted with Nurtec. She is feeling well, today.   02/27/2021 ALL: She continues to do well on Botox. Greater than 50% reduction in migraines. Emgality and rizatriptan working well.   11/29/2020 ALL: She continues to do well. She continues Botox and Emgality. Rizatriptan works well for abortive therapy. Average 1 migraine per month.   Consent Form Botulism Toxin Injection For Chronic Migraine    Reviewed orally with patient, additionally signature is on file:  Botulism toxin has been approved by the Federal drug administration for treatment of chronic migraine. Botulism toxin does not cure chronic migraine and it may not be effective in some patients.  The administration of botulism toxin is accomplished by injecting a small amount of toxin into the muscles of the neck and head. Dosage must be titrated for each individual. Any benefits resulting from botulism toxin tend to wear off after 3 months with a repeat injection required if benefit is to be maintained. Injections are usually done every 3-4 months with maximum effect peak achieved by about 2 or 3 weeks. Botulism toxin is expensive and you should be sure of what costs you will  incur resulting from the injection.  The side effects of botulism toxin use for chronic migraine may include:   -Transient, and usually mild, facial weakness with facial injections  -Transient, and usually mild, head or neck weakness with head/neck injections  -Reduction or loss of forehead facial animation due to forehead muscle weakness  -Eyelid drooping  -Dry eye  -Pain at the site of injection or bruising at the site of injection  -Double vision  -Potential unknown long term risks   Contraindications: You should not have Botox if you are pregnant, nursing, allergic to albumin, have an infection, skin condition, or muscle weakness at the site of the injection, or have myasthenia gravis, Lambert-Eaton syndrome, or ALS.  It is also possible that as with any injection, there may be an allergic reaction or no effect from the medication. Reduced effectiveness after repeated injections is sometimes seen and rarely infection at the injection site may occur. All care will be taken to prevent these side effects. If therapy is given over a long time, atrophy and wasting in the muscle injected may occur. Occasionally the patient's become refractory to treatment because they develop antibodies to the toxin. In this event, therapy needs to be modified.  I have read the above information and consent to the administration of botulism toxin.    BOTOX PROCEDURE NOTE FOR MIGRAINE HEADACHE  Contraindications and precautions discussed with patient(above). Aseptic procedure was observed and patient tolerated procedure. Procedure performed by Debbora Presto, FNP-C.   The condition has existed for more than 6 months, and pt does not have a diagnosis of ALS, Myasthenia Gravis or Lambert-Eaton Syndrome.  Risks  and benefits of injections discussed and pt agrees to proceed with the procedure.  Written consent obtained  These injections are medically necessary. Pt  receives good benefits from these injections. These  injections do not cause sedations or hallucinations which the oral therapies may cause.   Description of procedure:  The patient was placed in a sitting position. The standard protocol was used for Botox as follows, with 5 units of Botox injected at each site:  -Procerus muscle, midline injection  -Corrugator muscle, bilateral injection  -Frontalis muscle, bilateral injection, with 2 sites each side, medial injection was performed in the upper one third of the frontalis muscle, in the region vertical from the medial inferior edge of the superior orbital rim. The lateral injection was again in the upper one third of the forehead vertically above the lateral limbus of the cornea, 1.5 cm lateral to the medial injection site.  -Temporalis muscle injection, 4 sites, bilaterally. The first injection was 3 cm above the tragus of the ear, second injection site was 1.5 cm to 3 cm up from the first injection site in line with the tragus of the ear. The third injection site was 1.5-3 cm forward between the first 2 injection sites. The fourth injection site was 1.5 cm posterior to the second injection site. 5th site laterally in the temporalis  muscleat the level of the outer canthus.  -Occipitalis muscle injection, 3 sites, bilaterally. The first injection was done one half way between the occipital protuberance and the tip of the mastoid process behind the ear. The second injection site was done lateral and superior to the first, 1 fingerbreadth from the first injection. The third injection site was 1 fingerbreadth superiorly and medially from the first injection site.  -Cervical paraspinal muscle injection, 2 sites, bilaterally. The first injection site was 1 cm from the midline of the cervical spine, 3 cm inferior to the lower border of the occipital protuberance. The second injection site was 1.5 cm superiorly and laterally to the first injection site.  -Trapezius muscle injection was performed at 3  sites, bilaterally. The first injection site was in the upper trapezius muscle halfway between the inflection point of the neck, and the acromion. The second injection site was one half way between the acromion and the first injection site. The third injection was done between the first injection site and the inflection point of the neck.   Will return for repeat injection in 3 months.   A total of 200 units of Botox was prepared, 155 units of Botox was injected as documented above, any Botox not injected was wasted. The patient tolerated the procedure well, there were no complications of the above procedure.

## 2021-09-16 ENCOUNTER — Other Ambulatory Visit: Payer: Self-pay | Admitting: Family Medicine

## 2021-09-16 ENCOUNTER — Ambulatory Visit: Payer: Medicare Other | Admitting: Family Medicine

## 2021-09-16 DIAGNOSIS — G43009 Migraine without aura, not intractable, without status migrainosus: Secondary | ICD-10-CM | POA: Diagnosis not present

## 2021-09-16 DIAGNOSIS — G43709 Chronic migraine without aura, not intractable, without status migrainosus: Secondary | ICD-10-CM | POA: Diagnosis not present

## 2021-09-16 MED ORDER — QULIPTA 60 MG PO TABS
60.0000 mg | ORAL_TABLET | Freq: Every day | ORAL | 3 refills | Status: DC
Start: 2021-09-16 — End: 2022-09-15

## 2021-09-16 NOTE — Progress Notes (Signed)
Botox- 200 units x 1 vial Lot: [id number] Expiration: 02/2024 NDC: 8110-3159-45  0.9% Sodium Chloride- 54mL total Lot: [id number] Expiration: 11/24/22 NDC: 4462-8638-17  Dx: R11.657 B/B

## 2021-09-16 NOTE — Progress Notes (Signed)
Faxed completed/signed Qulipta prescription enrollment form to [employer] complete at [phone]. Received fax confirmation.

## 2021-09-17 ENCOUNTER — Encounter: Payer: Self-pay | Admitting: Family Medicine

## 2021-09-17 ENCOUNTER — Other Ambulatory Visit: Payer: Self-pay | Admitting: Family Medicine

## 2021-09-17 NOTE — Telephone Encounter (Signed)
Faxed completed/signed [place] Qulipta to [facility] at [phone]. Received fax confirmation, waiting on determination.

## 2021-09-20 ENCOUNTER — Other Ambulatory Visit (HOSPITAL_COMMUNITY): Payer: Self-pay | Admitting: Nurse Practitioner

## 2021-09-20 DIAGNOSIS — L821 Other seborrheic keratosis: Secondary | ICD-10-CM | POA: Diagnosis not present

## 2021-09-20 DIAGNOSIS — L578 Other skin changes due to chronic exposure to nonionizing radiation: Secondary | ICD-10-CM | POA: Diagnosis not present

## 2021-09-20 DIAGNOSIS — L738 Other specified follicular disorders: Secondary | ICD-10-CM | POA: Diagnosis not present

## 2021-09-20 DIAGNOSIS — L570 Actinic keratosis: Secondary | ICD-10-CM | POA: Diagnosis not present

## 2021-09-20 DIAGNOSIS — Z411 Encounter for cosmetic surgery: Secondary | ICD-10-CM | POA: Diagnosis not present

## 2021-09-20 DIAGNOSIS — D225 Melanocytic nevi of trunk: Secondary | ICD-10-CM | POA: Diagnosis not present

## 2021-09-30 DIAGNOSIS — R1013 Epigastric pain: Secondary | ICD-10-CM | POA: Diagnosis not present

## 2021-09-30 DIAGNOSIS — Z719 Counseling, unspecified: Secondary | ICD-10-CM

## 2021-10-01 DIAGNOSIS — M25511 Pain in right shoulder: Secondary | ICD-10-CM | POA: Diagnosis not present

## 2021-10-01 DIAGNOSIS — M25522 Pain in left elbow: Secondary | ICD-10-CM | POA: Diagnosis not present

## 2021-10-04 ENCOUNTER — Other Ambulatory Visit (HOSPITAL_COMMUNITY): Payer: Self-pay | Admitting: Nurse Practitioner

## 2021-10-04 LAB — CUP PACEART REMOTE DEVICE CHECK
Date Time Interrogation Session: 20221105095532
Implantable Pulse Generator Implant Date: 20210902

## 2021-10-07 ENCOUNTER — Ambulatory Visit (INDEPENDENT_AMBULATORY_CARE_PROVIDER_SITE_OTHER): Payer: Medicare Other

## 2021-10-07 DIAGNOSIS — I480 Paroxysmal atrial fibrillation: Secondary | ICD-10-CM | POA: Diagnosis not present
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
## 2021-10-08 NOTE — Patient Instructions (Signed)
Below is our plan:  We will continue Botox. Continue Qulipta. We will gabapentin 300mg  at bedtime. We can consider zonisamide if gabapentin does not help. Try taking Nurtec and naratriptan together. You can also take Tylenol 1000mg  and Benadryl 25-50mg  with abortive meds. Do not take Nsaids while on Eliquis.   Please make sure you are staying well hydrated. I recommend 50-60 ounces daily. Well balanced diet and regular exercise encouraged. Consistent sleep schedule with 6-8 hours recommended.   Please continue follow up with care team as directed.   Follow up with me in 4-6 months   You may receive a survey regarding today's visit. I encourage you to leave honest feed back as I do use this information to improve patient care. Thank you for seeing me today!

## 2021-10-09 ENCOUNTER — Encounter: Payer: Self-pay | Admitting: Family Medicine

## 2021-10-09 ENCOUNTER — Ambulatory Visit: Payer: Medicare Other | Admitting: Family Medicine

## 2021-10-09 VITALS — BP 116/60 | HR 78 | Ht 62.0 in | Wt 145.2 lb

## 2021-10-09 DIAGNOSIS — G43009 Migraine without aura, not intractable, without status migrainosus: Secondary | ICD-10-CM

## 2021-10-09 MED ORDER — GABAPENTIN 300 MG PO CAPS
300.0000 mg | ORAL_CAPSULE | Freq: Every day | ORAL | 1 refills | Status: DC
Start: 2021-10-09 — End: 2021-12-10

## 2021-10-09 MED ORDER — NARATRIPTAN HCL 2.5 MG PO TABS: 2.5000 mg | ORAL_TABLET | ORAL | 0 refills | Status: DC | PRN

## 2021-10-10 ENCOUNTER — Telehealth: Payer: Self-pay

## 2021-10-10 NOTE — Telephone Encounter (Signed)
"  ILR alert report received. Battery status OK. Normal device function. No new tachy, brady, or pause episodes. One new two  minute AF episode. There were 16 symptom episodes detected that appear to be SR/ST with short bursts of a fast ventricular arrhythmia.  Sent to triage.  Monthly summary reports and ROV/PRN"  Known history. Implant for AF management. On Eliquis. New episode only 2 min. Sending to MD as Juluis Rainier since transmission not exported. Continue to monitor.

## 2021-10-13 DIAGNOSIS — M25511 Pain in right shoulder: Secondary | ICD-10-CM | POA: Diagnosis not present

## 2021-10-14 NOTE — Progress Notes (Signed)
Carelink Summary Report / Loop Recorder 

## 2021-10-15 DIAGNOSIS — M25522 Pain in left elbow: Secondary | ICD-10-CM | POA: Diagnosis not present

## 2021-10-22 DIAGNOSIS — M25522 Pain in left elbow: Secondary | ICD-10-CM | POA: Diagnosis not present

## 2021-10-22 DIAGNOSIS — M25512 Pain in left shoulder: Secondary | ICD-10-CM | POA: Diagnosis not present

## 2021-10-25 DIAGNOSIS — M25522 Pain in left elbow: Secondary | ICD-10-CM | POA: Diagnosis not present

## 2021-10-28 ENCOUNTER — Other Ambulatory Visit: Payer: Self-pay | Admitting: Family Medicine

## 2021-10-28 ENCOUNTER — Encounter: Payer: Self-pay | Admitting: Family Medicine

## 2021-10-28 MED ORDER — ZONISAMIDE 25 MG PO CAPS: 25.0000 mg | ORAL_CAPSULE | Freq: Every day | ORAL | 3 refills | Status: DC

## 2021-10-28 NOTE — Progress Notes (Signed)
Stop gabapentin start zonisamide

## 2021-10-29 DIAGNOSIS — M25522 Pain in left elbow: Secondary | ICD-10-CM | POA: Diagnosis not present

## 2021-11-01 DIAGNOSIS — M25522 Pain in left elbow: Secondary | ICD-10-CM | POA: Diagnosis not present

## 2021-11-07 ENCOUNTER — Telehealth: Payer: Self-pay | Admitting: *Deleted

## 2021-11-07 NOTE — Telephone Encounter (Signed)
Request Reference Number: [id number]. NURTEC TAB 75MG  ODT is approved through 11/23/2022. Your patient may now fill this prescription and it will be covered.

## 2021-11-07 NOTE — Telephone Encounter (Signed)
Submitted PA on CMM. Key: [id number]. Waiting on determination from [employer].

## 2021-11-08 DIAGNOSIS — M25522 Pain in left elbow: Secondary | ICD-10-CM | POA: Diagnosis not present

## 2021-11-11 ENCOUNTER — Ambulatory Visit (INDEPENDENT_AMBULATORY_CARE_PROVIDER_SITE_OTHER): Payer: Medicare Other

## 2021-11-11 ENCOUNTER — Other Ambulatory Visit: Payer: Self-pay | Admitting: Family Medicine

## 2021-11-11 DIAGNOSIS — I480 Paroxysmal atrial fibrillation: Secondary | ICD-10-CM | POA: Diagnosis not present

## 2021-11-11 DIAGNOSIS — G43009 Migraine without aura, not intractable, without status migrainosus: Secondary | ICD-10-CM

## 2021-11-12 ENCOUNTER — Other Ambulatory Visit: Payer: Self-pay | Admitting: Interventional Radiology

## 2021-11-12 DIAGNOSIS — I82402 Acute embolism and thrombosis of unspecified deep veins of left lower extremity: Secondary | ICD-10-CM

## 2021-11-12 LAB — CUP PACEART REMOTE DEVICE CHECK
Date Time Interrogation Session: 20221217230759
Implantable Pulse Generator Implant Date: 20210902

## 2021-11-12 MED ORDER — NARATRIPTAN HCL 2.5 MG PO TABS: 2.5000 mg | ORAL_TABLET | ORAL | 0 refills | Status: DC | PRN

## 2021-11-13 DIAGNOSIS — M810 Age-related osteoporosis without current pathological fracture: Secondary | ICD-10-CM | POA: Diagnosis not present

## 2021-11-13 DIAGNOSIS — E7800 Pure hypercholesterolemia, unspecified: Secondary | ICD-10-CM | POA: Diagnosis not present

## 2021-11-13 DIAGNOSIS — I10 Essential (primary) hypertension: Secondary | ICD-10-CM | POA: Diagnosis not present

## 2021-11-13 DIAGNOSIS — I480 Paroxysmal atrial fibrillation: Secondary | ICD-10-CM | POA: Diagnosis not present

## 2021-11-20 ENCOUNTER — Other Ambulatory Visit: Payer: Self-pay | Admitting: *Deleted

## 2021-11-20 MED ORDER — ZONISAMIDE 50 MG PO CAPS
50.0000 mg | ORAL_CAPSULE | Freq: Every day | ORAL | 1 refills | Status: DC
Start: 2021-11-20 — End: 2021-12-10

## 2021-11-20 NOTE — Progress Notes (Signed)
Carelink Summary Report / Loop Recorder 

## 2021-11-26 DIAGNOSIS — M7712 Lateral epicondylitis, left elbow: Secondary | ICD-10-CM | POA: Diagnosis not present

## 2021-11-26 DIAGNOSIS — M25522 Pain in left elbow: Secondary | ICD-10-CM | POA: Diagnosis not present

## 2021-11-27 ENCOUNTER — Encounter: Payer: Self-pay | Admitting: Family Medicine

## 2021-11-29 NOTE — Telephone Encounter (Signed)
Since the increase was done 2 days ago, just inform the patient to go back to 50 mg and to contact Amy next week for further recommendation.

## 2021-12-03 ENCOUNTER — Ambulatory Visit
Admission: RE | Admit: 2021-12-03 | Discharge: 2021-12-03 | Disposition: A | Discharge status: Home / Self Care | Payer: Medicare Other | Source: Ambulatory Visit | Attending: Interventional Radiology | Admitting: Interventional Radiology

## 2021-12-03 ENCOUNTER — Ambulatory Visit: Payer: Medicare Other | Admitting: Family Medicine

## 2021-12-03 ENCOUNTER — Encounter: Payer: Self-pay | Admitting: *Deleted

## 2021-12-03 DIAGNOSIS — I82402 Acute embolism and thrombosis of unspecified deep veins of left lower extremity: Secondary | ICD-10-CM

## 2021-12-03 DIAGNOSIS — Z9889 Other specified postprocedural states: Secondary | ICD-10-CM | POA: Diagnosis not present

## 2021-12-03 DIAGNOSIS — I871 Compression of vein: Secondary | ICD-10-CM | POA: Diagnosis not present

## 2021-12-03 HISTORY — PX: IR RADIOLOGIST EVAL & MGMT: IMG5224
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
## 2021-12-09 ENCOUNTER — Telehealth: Payer: Self-pay | Admitting: Family Medicine

## 2021-12-09 NOTE — Telephone Encounter (Signed)
Pt left a vm @ 12:39  returning a call to [place], Therapist, sports

## 2021-12-09 NOTE — Telephone Encounter (Signed)
I had not called the pt again. Spoke with pt. She is still scheduled with Dr Jaynee Eagles for VV tomorrow at 1:30 PM. Pt verbalized appreciation for the call.

## 2021-12-10 ENCOUNTER — Encounter: Payer: Self-pay | Admitting: Neurology

## 2021-12-10 ENCOUNTER — Other Ambulatory Visit: Payer: Self-pay | Admitting: Neurology

## 2021-12-10 ENCOUNTER — Telehealth (INDEPENDENT_AMBULATORY_CARE_PROVIDER_SITE_OTHER): Payer: Medicare Other | Admitting: Neurology

## 2021-12-10 DIAGNOSIS — G43009 Migraine without aura, not intractable, without status migrainosus: Secondary | ICD-10-CM

## 2021-12-10 DIAGNOSIS — Z1231 Encounter for screening mammogram for malignant neoplasm of breast: Secondary | ICD-10-CM | POA: Diagnosis not present

## 2021-12-10 MED ORDER — UBRELVY 100 MG PO TABS: 100.0000 mg | ORAL_TABLET | ORAL | 11 refills | Status: DC | PRN

## 2021-12-10 MED ORDER — ATENOLOL 25 MG PO TABS: 12.5000 mg | ORAL_TABLET | Freq: Every day | ORAL | 6 refills | Status: DC

## 2021-12-10 NOTE — Patient Instructions (Signed)
-  Botox has improved her migraines significantly, she used to have daily migraines and now she has 12 total headache days a month which includes migraines and headaches of 12 total.  She has tried and failed so many medications is very difficult to find something she has not tried including all of the CGRP injections.  Luckily Botox has significantly impacted her life.  We did have a long talk today about other possibilities, she has never tried candesartan, she has not tried beta-blockers in many years, memantine has some evidence.  She had side effects on zonogram so we stopped that.  Lenoria Chime does not appear to be helping her but at this time I will not stop it we do not want to do too many things at once.  We had a very long talk about options and I answered all her questions -She has Botox coming up, continue Botox for for her migraine Prevention -Prevention.  We will start atenolol 12.5 mg nightly for prevention.  She has normal blood pressure and her last pulse was 78, but we did talk about side effects and reasons for her to stop.  She has not tried a beta-blocker for many years -acutely She takes a cocktail of Nurtec, a triptan, benadryl and Phenergan and that seems to work for her however she runs out of Nurtec.  We will try Ubrelvy acutely in place of the Nurtec and see if that helps. -Next I would stopped qulipta, does not appear as though it is helping her -she is stopping her zonisamide 70m due to side effects - she has already stopped gabapentin
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
## 2021-12-11 ENCOUNTER — Telehealth: Payer: Self-pay | Admitting: *Deleted

## 2021-12-11 ENCOUNTER — Telehealth: Payer: Self-pay | Admitting: Family Medicine

## 2021-12-11 DIAGNOSIS — K219 Gastro-esophageal reflux disease without esophagitis: Secondary | ICD-10-CM | POA: Diagnosis not present

## 2021-12-11 DIAGNOSIS — Z862 Personal history of diseases of the blood and blood-forming organs and certain disorders involving the immune mechanism: Secondary | ICD-10-CM | POA: Diagnosis not present

## 2021-12-11 NOTE — Telephone Encounter (Signed)
Patient has a Botox appointment 1/26. She now has [facility] Medicare, which requires PA for Botox. I initiated request for Botox ([id number]) for G43.009 via [facility] portal. Request was approved. PA #[id number] (12/10/21-12/10/22).

## 2021-12-11 NOTE — Telephone Encounter (Signed)
Roselyn Meier PA completed on Cover My Meds and approved immediately. Key: [id number]. Request Reference Number: [id number]. UBRELVY TAB 100MG  is approved through 11/23/2022. Your patient may now fill this prescription and it will be covered.

## 2021-12-16 ENCOUNTER — Ambulatory Visit (INDEPENDENT_AMBULATORY_CARE_PROVIDER_SITE_OTHER): Payer: Medicare Other

## 2021-12-16 DIAGNOSIS — I480 Paroxysmal atrial fibrillation: Secondary | ICD-10-CM | POA: Diagnosis not present

## 2021-12-16 LAB — CUP PACEART REMOTE DEVICE CHECK
Date Time Interrogation Session: 20230122231135
Implantable Pulse Generator Implant Date: 20210902

## 2021-12-18 NOTE — Progress Notes (Signed)
12/19/2021 ALL: She returns for Botox. She was seen in follow up with Dr Jaynee Eagles via [place] 12/10/2021 and started on atenolol 12.5mg  daily. She has failed multiple oral and injection preventatives. She continues [place]. She feels that she is doing a little better. She did have a migraine yesterday thought to be related to the weather. Dr Jaynee Eagles gave her Ubrelvy 16 tabs per month. She also has Nurtec and naratriptan which help with abortive therapy. She is aware not to take more than prescribed dose and to try to avoid regular use of abortive agents.   09/16/2021 ALL: She returns for Botox. She is having more frequent and intense migraines. Tummy tuck over the summer then DVTs. Hemoglobin dropped and had GI eval. More stress. About 4-5 migraines a month but some toward end of Emgality/Botox cycle are severe and intractable. Medrol dose pack did not help much last week. Nurtec and naratriptan do help some. Tried and failed topiramate, nortriptyline, propranolol, metoprolol, Amovig, Emgality, Ajovy, gabapentin, sumatriptan, rizatriptan, Botox. We will try Qulipta 60mg  daily.  06/12/2021 ALL: She returns for Botox procedure. She had difficulty with an intractable migraines that lasted for over a week. It was finally aborted with migraine cocktail and steroid taper. She usually has about 1 migraine per month that is easily aborted with Nurtec. She is feeling well, today.   02/27/2021 ALL: She continues to do well on Botox. Greater than 50% reduction in migraines. Emgality and rizatriptan working well.   11/29/2020 ALL: She continues to do well. She continues Botox and Emgality. Rizatriptan works well for abortive therapy. Average 1 migraine per month.    Consent Form Botulism Toxin Injection For Chronic Migraine    Reviewed orally with patient, additionally signature is on file:  Botulism toxin has been approved by the Federal drug administration for treatment of chronic migraine. Botulism toxin does not  cure chronic migraine and it may not be effective in some patients.  The administration of botulism toxin is accomplished by injecting a small amount of toxin into the muscles of the neck and head. Dosage must be titrated for each individual. Any benefits resulting from botulism toxin tend to wear off after 3 months with a repeat injection required if benefit is to be maintained. Injections are usually done every 3-4 months with maximum effect peak achieved by about 2 or 3 weeks. Botulism toxin is expensive and you should be sure of what costs you will incur resulting from the injection.  The side effects of botulism toxin use for chronic migraine may include:   -Transient, and usually mild, facial weakness with facial injections  -Transient, and usually mild, head or neck weakness with head/neck injections  -Reduction or loss of forehead facial animation due to forehead muscle weakness  -Eyelid drooping  -Dry eye  -Pain at the site of injection or bruising at the site of injection  -Double vision  -Potential unknown long term risks   Contraindications: You should not have Botox if you are pregnant, nursing, allergic to albumin, have an infection, skin condition, or muscle weakness at the site of the injection, or have myasthenia gravis, Lambert-Eaton syndrome, or ALS.  It is also possible that as with any injection, there may be an allergic reaction or no effect from the medication. Reduced effectiveness after repeated injections is sometimes seen and rarely infection at the injection site may occur. All care will be taken to prevent these side effects. If therapy is given over a long time, atrophy and wasting  in the muscle injected may occur. Occasionally the patient's become refractory to treatment because they develop antibodies to the toxin. In this event, therapy needs to be modified.  I have read the above information and consent to the administration of botulism toxin.    BOTOX  PROCEDURE NOTE FOR MIGRAINE HEADACHE  Contraindications and precautions discussed with patient(above). Aseptic procedure was observed and patient tolerated procedure. Procedure performed by Debbora Presto, FNP-C.   The condition has existed for more than 6 months, and pt does not have a diagnosis of ALS, Myasthenia Gravis or Lambert-Eaton Syndrome.  Risks and benefits of injections discussed and pt agrees to proceed with the procedure.  Written consent obtained  These injections are medically necessary. Pt  receives good benefits from these injections. These injections do not cause sedations or hallucinations which the oral therapies may cause.   Description of procedure:  The patient was placed in a sitting position. The standard protocol was used for Botox as follows, with 5 units of Botox injected at each site:  -Procerus muscle, midline injection  -Corrugator muscle, bilateral injection  -Frontalis muscle, bilateral injection, with 2 sites each side, medial injection was performed in the upper one third of the frontalis muscle, in the region vertical from the medial inferior edge of the superior orbital rim. The lateral injection was again in the upper one third of the forehead vertically above the lateral limbus of the cornea, 1.5 cm lateral to the medial injection site.  -Temporalis muscle injection, 4 sites, bilaterally. The first injection was 3 cm above the tragus of the ear, second injection site was 1.5 cm to 3 cm up from the first injection site in line with the tragus of the ear. The third injection site was 1.5-3 cm forward between the first 2 injection sites. The fourth injection site was 1.5 cm posterior to the second injection site. 5th site laterally in the temporalis  muscleat the level of the outer canthus.  -Occipitalis muscle injection, 3 sites, bilaterally. The first injection was done one half way between the occipital protuberance and the tip of the mastoid process behind the  ear. The second injection site was done lateral and superior to the first, 1 fingerbreadth from the first injection. The third injection site was 1 fingerbreadth superiorly and medially from the first injection site.  -Cervical paraspinal muscle injection, 2 sites, bilaterally. The first injection site was 1 cm from the midline of the cervical spine, 3 cm inferior to the lower border of the occipital protuberance. The second injection site was 1.5 cm superiorly and laterally to the first injection site.  -Trapezius muscle injection was performed at 3 sites, bilaterally. The first injection site was in the upper trapezius muscle halfway between the inflection point of the neck, and the acromion. The second injection site was one half way between the acromion and the first injection site. The third injection was done between the first injection site and the inflection point of the neck.   Will return for repeat injection in 3 months.   A total of 200 units of Botox was prepared, 155 units of Botox was injected as documented above, any Botox not injected was wasted. The patient tolerated the procedure well, there were no complications of the above procedure.

## 2021-12-19 ENCOUNTER — Ambulatory Visit: Payer: Medicare Other | Admitting: Family Medicine

## 2021-12-19 DIAGNOSIS — G43709 Chronic migraine without aura, not intractable, without status migrainosus: Secondary | ICD-10-CM | POA: Diagnosis not present

## 2021-12-19 DIAGNOSIS — J029 Acute pharyngitis, unspecified: Secondary | ICD-10-CM | POA: Diagnosis not present

## 2021-12-19 DIAGNOSIS — G43009 Migraine without aura, not intractable, without status migrainosus: Secondary | ICD-10-CM

## 2021-12-19 MED ORDER — NARATRIPTAN HCL 2.5 MG PO TABS: 2.5000 mg | ORAL_TABLET | ORAL | 11 refills | Status: DC | PRN

## 2021-12-19 NOTE — Progress Notes (Signed)
Botox- 200 units x 1 vial Lot: [id number] Expiration: 07/2024 NDC: 4600-2984-73  Bacteriostatic 0.9% Sodium Chloride- 55mL total Lot: [id number] Expiration: 06/25/2023 NDC: 3700-5259-10  Dx: A89.022 B/B

## 2021-12-23 ENCOUNTER — Ambulatory Visit: Payer: Medicare Other | Admitting: Family Medicine

## 2021-12-23 ENCOUNTER — Telehealth: Payer: Medicare Other | Admitting: Neurology

## 2021-12-26 NOTE — Progress Notes (Signed)
Carelink Summary Report / Loop Recorder 

## 2022-01-01 ENCOUNTER — Encounter: Payer: Self-pay | Admitting: Family Medicine

## 2022-01-18 LAB — CUP PACEART REMOTE DEVICE CHECK
Date Time Interrogation Session: 20230224230830
Implantable Pulse Generator Implant Date: 20210902

## 2022-01-20 ENCOUNTER — Ambulatory Visit (INDEPENDENT_AMBULATORY_CARE_PROVIDER_SITE_OTHER): Payer: Medicare Other

## 2022-01-20 DIAGNOSIS — I480 Paroxysmal atrial fibrillation: Secondary | ICD-10-CM | POA: Diagnosis not present

## 2022-01-24 DIAGNOSIS — M7712 Lateral epicondylitis, left elbow: Secondary | ICD-10-CM | POA: Diagnosis not present

## 2022-01-27 ENCOUNTER — Other Ambulatory Visit: Payer: Self-pay | Admitting: Otolaryngology

## 2022-01-27 DIAGNOSIS — J351 Hypertrophy of tonsils: Secondary | ICD-10-CM | POA: Diagnosis not present

## 2022-01-27 DIAGNOSIS — J358 Other chronic diseases of tonsils and adenoids: Secondary | ICD-10-CM | POA: Diagnosis not present

## 2022-01-27 DIAGNOSIS — J3501 Chronic tonsillitis: Secondary | ICD-10-CM | POA: Diagnosis not present

## 2022-01-27 NOTE — Progress Notes (Signed)
Carelink Summary Report / Loop Recorder 

## 2022-02-13 ENCOUNTER — Encounter: Payer: Self-pay | Admitting: Family Medicine

## 2022-02-13 ENCOUNTER — Other Ambulatory Visit: Payer: Self-pay | Admitting: Family Medicine

## 2022-02-13 MED ORDER — ATENOLOL 25 MG PO TABS: 50.0000 mg | ORAL_TABLET | Freq: Every day | ORAL | 3 refills | Status: DC

## 2022-02-18 ENCOUNTER — Encounter: Payer: Self-pay | Admitting: Family Medicine

## 2022-02-18 NOTE — Telephone Encounter (Signed)
Submitted PA naratriptan on CMM. Key: [id number] - PA Case ID: [id number]. Waiting on determination from [facility]. ?

## 2022-02-19 NOTE — Telephone Encounter (Signed)
Received a questionnaire to complete for [facility]. I have completed and forwarded this to the fax number that is provided on the form. Received confirmation that fax went through ?

## 2022-02-20 NOTE — Telephone Encounter (Signed)
Received a notification that this medication is on the patients plan and is covered. ?

## 2022-02-24 ENCOUNTER — Ambulatory Visit (INDEPENDENT_AMBULATORY_CARE_PROVIDER_SITE_OTHER): Payer: Medicare Other

## 2022-02-24 DIAGNOSIS — I480 Paroxysmal atrial fibrillation: Secondary | ICD-10-CM | POA: Diagnosis not present

## 2022-02-25 LAB — CUP PACEART REMOTE DEVICE CHECK
Date Time Interrogation Session: 20230331230804
Implantable Pulse Generator Implant Date: 20210902

## 2022-03-11 NOTE — Progress Notes (Signed)
Carelink Summary Report / Loop Recorder 

## 2022-03-19 NOTE — Progress Notes (Signed)
? ?03/20/2022 ALL: Christina [name] returns for Botox. We increased atenolol to '50mg'$  daily and continued Qulipta. She reports daily headaches are significantly better. She may have 1 headache per week. She may have 2 migraines on average per month. She uses either Nurtec, ubrelvy or naratriptan for abortive therapy.  ? ?12/19/2021 ALL: She returns for Botox. She was seen in follow up with Dr Jaynee Eagles via [place] 12/10/2021 and started on atenolol 12.'5mg'$  daily. She has failed multiple oral and injection preventatives. She continues [place]. She feels that she is doing a little better. She did have a migraine yesterday thought to be related to the weather. Dr Jaynee Eagles gave her Ubrelvy 16 tabs per month. She also has Nurtec and naratriptan which help with abortive therapy. She is aware not to take more than prescribed dose and to try to avoid regular use of abortive agents.  ? ?09/16/2021 ALL: She returns for Botox. She is having more frequent and intense migraines. Tummy tuck over the summer then DVTs. Hemoglobin dropped and had GI eval. More stress. About 4-5 migraines a month but some toward end of Emgality/Botox cycle are severe and intractable. Medrol dose pack did not help much last week. Nurtec and naratriptan do help some. Tried and failed topiramate, nortriptyline, propranolol, metoprolol, Amovig, Emgality, Ajovy, gabapentin, sumatriptan, rizatriptan, Botox. We will try Qulipta '60mg'$  daily. ? ?06/12/2021 ALL: She returns for Botox procedure. She had difficulty with an intractable migraines that lasted for over a week. It was finally aborted with migraine cocktail and steroid taper. She usually has about 1 migraine per month that is easily aborted with Nurtec. She is feeling well, today.  ? ?02/27/2021 ALL: She continues to do well on Botox. Greater than 50% reduction in migraines. Emgality and rizatriptan working well.  ? ?11/29/2020 ALL: She continues to do well. She continues Botox and Emgality. Rizatriptan works well for abortive  therapy. Average 1 migraine per month.  ? ? ?Consent Form ?Botulism Toxin Injection For Chronic Migraine ? ? ? ?Reviewed orally with patient, additionally signature is on file: ? ?Botulism toxin has been approved by the Federal drug administration for treatment of chronic migraine. Botulism toxin does not cure chronic migraine and it may not be effective in some patients. ? ?The administration of botulism toxin is accomplished by injecting a small amount of toxin into the muscles of the neck and head. Dosage must be titrated for each individual. Any benefits resulting from botulism toxin tend to wear off after 3 months with a repeat injection required if benefit is to be maintained. Injections are usually done every 3-4 months with maximum effect peak achieved by about 2 or 3 weeks. Botulism toxin is expensive and you should be sure of what costs you will incur resulting from the injection. ? ?The side effects of botulism toxin use for chronic migraine may include: ? ? -Transient, and usually mild, facial weakness with facial injections ? -Transient, and usually mild, head or neck weakness with head/neck injections ? -Reduction or loss of forehead facial animation due to forehead muscle weakness ? -Eyelid drooping ? -Dry eye ? -Pain at the site of injection or bruising at the site of injection ? -Double vision ? -Potential unknown long term risks ? ? ?Contraindications: You should not have Botox if you are pregnant, nursing, allergic to albumin, have an infection, skin condition, or muscle weakness at the site of the injection, or have myasthenia gravis, Lambert-Eaton syndrome, or ALS. ? ?It is also possible that as with any injection,  there may be an allergic reaction or no effect from the medication. Reduced effectiveness after repeated injections is sometimes seen and rarely infection at the injection site may occur. All care will be taken to prevent these side effects. If therapy is given over a long time,  atrophy and wasting in the muscle injected may occur. Occasionally the patient's become refractory to treatment because they develop antibodies to the toxin. In this event, therapy needs to be modified. ? ?I have read the above information and consent to the administration of botulism toxin. ? ? ? ?BOTOX PROCEDURE NOTE FOR MIGRAINE HEADACHE ? ?Contraindications and precautions discussed with patient(above). Aseptic procedure was observed and patient tolerated procedure. Procedure performed by Debbora Presto, FNP-C.  ? ?The condition has existed for more than 6 months, and pt does not have a diagnosis of ALS, Myasthenia Gravis or Lambert-Eaton Syndrome.  Risks and benefits of injections discussed and pt agrees to proceed with the procedure.  Written consent obtained ? ?These injections are medically necessary. Pt  receives good benefits from these injections. These injections do not cause sedations or hallucinations which the oral therapies may cause. ? ? ?Description of procedure: ? ?The patient was placed in a sitting position. The standard protocol was used for Botox as follows, with 5 units of Botox injected at each site: ? ?-Procerus muscle, midline injection ? ?-Corrugator muscle, bilateral injection ? ?-Frontalis muscle, bilateral injection, with 2 sites each side, medial injection was performed in the upper one third of the frontalis muscle, in the region vertical from the medial inferior edge of the superior orbital rim. The lateral injection was again in the upper one third of the forehead vertically above the lateral limbus of the cornea, 1.5 cm lateral to the medial injection site. ? ?-Temporalis muscle injection, 4 sites, bilaterally. The first injection was 3 cm above the tragus of the ear, second injection site was 1.5 cm to 3 cm up from the first injection site in line with the tragus of the ear. The third injection site was 1.5-3 cm forward between the first 2 injection sites. The fourth injection site  was 1.5 cm posterior to the second injection site. 5th site laterally in the temporalis  muscleat the level of the outer canthus. ? ?-Occipitalis muscle injection, 3 sites, bilaterally. The first injection was done one half way between the occipital protuberance and the tip of the mastoid process behind the ear. The second injection site was done lateral and superior to the first, 1 fingerbreadth from the first injection. The third injection site was 1 fingerbreadth superiorly and medially from the first injection site. ? ?-Cervical paraspinal muscle injection, 2 sites, bilaterally. The first injection site was 1 cm from the midline of the cervical spine, 3 cm inferior to the lower border of the occipital protuberance. The second injection site was 1.5 cm superiorly and laterally to the first injection site. ? ?-Trapezius muscle injection was performed at 3 sites, bilaterally. The first injection site was in the upper trapezius muscle halfway between the inflection point of the neck, and the acromion. The second injection site was one half way between the acromion and the first injection site. The third injection was done between the first injection site and the inflection point of the neck. ? ? ?Will return for repeat injection in 3 months. ? ? ?A total of 200 units of Botox was prepared, 155 units of Botox was injected as documented above, any Botox not injected was wasted. The patient  tolerated the procedure well, there were no complications of the above procedure. ? ? ?

## 2022-03-20 ENCOUNTER — Ambulatory Visit: Payer: Medicare Other | Admitting: Family Medicine

## 2022-03-20 DIAGNOSIS — G43709 Chronic migraine without aura, not intractable, without status migrainosus: Secondary | ICD-10-CM

## 2022-03-20 NOTE — Progress Notes (Signed)
Botox- 200 units x 1 vial ?Lot: [id number] ?Expiration: 10/2024 ?[place]: [phone] ? ?Bacteriostatic 0.9% Sodium Chloride- 59m total ?Lot: [id number]?Expiration: 06/25/2023 ?[place] [address]-41? ?Dx: GH91.444?B/B  ?

## 2022-03-24 DIAGNOSIS — L03031 Cellulitis of right toe: Secondary | ICD-10-CM | POA: Diagnosis not present

## 2022-03-27 LAB — CUP PACEART REMOTE DEVICE CHECK
Date Time Interrogation Session: 20230503230746
Implantable Pulse Generator Implant Date: 20210902

## 2022-03-31 ENCOUNTER — Ambulatory Visit (INDEPENDENT_AMBULATORY_CARE_PROVIDER_SITE_OTHER): Payer: Medicare Other

## 2022-03-31 DIAGNOSIS — I480 Paroxysmal atrial fibrillation: Secondary | ICD-10-CM

## 2022-04-03 DIAGNOSIS — Z9889 Other specified postprocedural states: Secondary | ICD-10-CM | POA: Diagnosis not present

## 2022-04-03 DIAGNOSIS — J029 Acute pharyngitis, unspecified: Secondary | ICD-10-CM | POA: Diagnosis not present

## 2022-04-03 DIAGNOSIS — Z9089 Acquired absence of other organs: Secondary | ICD-10-CM | POA: Diagnosis not present

## 2022-04-07 ENCOUNTER — Other Ambulatory Visit: Payer: Self-pay | Admitting: Otolaryngology

## 2022-04-07 DIAGNOSIS — J029 Acute pharyngitis, unspecified: Secondary | ICD-10-CM

## 2022-04-07 DIAGNOSIS — L03031 Cellulitis of right toe: Secondary | ICD-10-CM | POA: Diagnosis not present

## 2022-04-08 NOTE — Patient Instructions (Addendum)
Below is our plan: ? ?We will continue Botox every 12 weeks and atenolol '50mg'$  daily. Continue Qulipta daily. Could consider taking 1/2 tablet daily if headaches continue to improve. Continue alternating Nurtec (1 daily), Ubrelvy (1 at onset, may repeat 1 in 2 hours but no more than 2 in 24 hours) and naratriptan (1 at onset, may repeat 1 in 2 hours but no more than 2 in 24 hours).  ? ?Please make sure you are staying well hydrated. I recommend 50-60 ounces daily. Well balanced diet and regular exercise encouraged. Consistent sleep schedule with 6-8 hours recommended.  ? ?Please continue follow up with care team as directed.  ? ?Follow up with me every 3 months for Botox and in 1 year for office visit  ? ?You may receive a survey regarding today's visit. I encourage you to leave honest feed back as I do use this information to improve patient care. Thank you for seeing me today!  ? ? ?

## 2022-04-08 NOTE — Progress Notes (Signed)
? ? ?PATIENT: Christina [name] ?DOB: 1951/10/21 ? ?REASON FOR VISIT: follow up ?HISTORY FROM: patient ? ?Chief Complaint  ?Patient presents with  ? Follow-up  ?  Pt alone, rm 3 (Hall [facility]) presents for follow up. Overall states that things are going well.   ? ?  ?HISTORY OF PRESENT ILLNESS: ? ?04/09/22 ALL: ?Christina [name] returns for follow up for migraines. She continues Botox. Now taking atenolol '50mg'$  daily and Qulipta '60mg'$  daily. She alternated Nurtec, Ubrelvy and naratriptan for abortive therapy. She is doing very well. She may have 4-5 headache days a month with 2-3 being migrainous. They are much less severe and easily aborted. She is very pleased with current regimen.  ? ?10/09/2021 ALL:  ?Christina [name] returns for follow up for migraines. She continues Botox. We switched her from [facility] to [facility] 09/16/2021 due to breakthrough migraines over the past 6 months. She also endorses more stress following some health issues following a tummy tuck over the summer. She developed a couple of DVTs and had a low hemoglobin level requiring extensive GI eval. Nurtec and naratriptan help with abortive therapy. She has tolerated Qulipta but not sure it is helping. She was having daily headaches but does report having two headache free days last week. This could represent some improvement. She previously did very well on gabapentin.  ? ?Failed Botox (on now), Amovig, Ajovy, Emgality (on now), topiramate, metoprolol, propranolol, atenolol, gabapentin, nortriptyline, sumatriptan, rizatriptan, naratriptan (on now), Nurtec (on now) ? ?12/20/2019 ALL:  ?Christina [name] is a 71 y.o. female here today for follow up for migraines. She is switching from Ajovy and sumatriptan to [employer] and rizatriptan due to insurance requirements. Last Botox procedure was 12/23. She has had significant relief of migraines since starting Botox. She reports headaches had nearly resolved over the past 6 months. Unfortunately, since her last Botox  procedure, she has experienced daily headaches. She reports that headaches are similar to those in the past. No new or concerning symptoms. She has had to take sumatriptan and Tylenol regularly. Otherwise, she is feeling well and without complaints.  ? ?HISTORY: (copied from Motley Martin's note on 06/30/2018) ? ?UPDATE 06/30/2018 CM Christina [name], 71 year old female returns for follow-up with history of migraine headaches.  When last seen she had 20 headaches for the month of April .  She has failed nortriptyline and Botox in the past stress and weather changes are big migraine triggers.  She was placed on Aimovig at her last visit and she is down to 1-2 headaches per month.  She takes Imitrex acutely with relief.  She returns for reevaluation ?  ?UPDATE 5/1/2019CM Christina [name], 25 female returns for follow-up of migraine headaches which are not in good control she has stopped her gabapentin because she did not feel it was working any longer in addition she stopped due to side effects of blurry vision numbness GI upset.  She has failed nortriptyline in the past due to palpitations.  She has kept a record of her headaches she had 20 headaches last month.  She states she has failed Botox in the past.  Some of her headaches start in the neck region and some in the [name] of the head.  Stress and weather changes bring on the headaches.  He returns for reevaluation diarrhea nausea ?  ?UPDATE 3/28/2019CM Christina [name] 71 year old female returns for follow-up with a history of migraine headaches which is have worsened since last seen.  She is currently on gabapentin 300 mg 3 capsules daily.  She takes Imitrex  acutely which does work for her headaches.  She has not kept a record.  Stress and weather changes bring on her headaches.  She gets little exercise she just had a cardioversion last week but remains on Eliquis she has history of 2 cardiac ablations.  She has had headaches since the age of 20.  She went to the urgent care  last week due to a headache.  She was given hydrocodone which made her sleep.  She was made aware we do not recommend that for her headaches.  She returns for reevaluation ?  ?UPDATE 08/28/2018CM Christina [name], 71 year old female returns for follow-up with history of migraine headaches which are currently well controlled on gabapentin. She takes Imitrex acutely which is beneficial. She is occasionally nauseated with her headaches and takes Phenergan when necessary. She is due for a cardiac ablation in a couple of weeks for atrial fibrillation. No other interval medical issues. She returns for reevaluation ?  ?UPDATE 07/08/16 CMMs. Jimmye [name], 71 year old female returns for yearly followup.  She has a history of headaches which are well controlled with gabapentin.She is taking Gapentin '900mg'$  daily for headaches. She takes Imitrex acutely which is beneficial . She is occasionally nauseated with headache and takes Phenergan. She is currently doing well. She returns for reevaluation.  No new neurologic complaints  ? ?History: Patient has had headaches dating [name] to age 52, initially occurring over the right eye associated with nausea and vomiting she did not seek medical attention at that time. In her 74s she began to have daily headaches in the [name] of the head associated with nausea she had no visual symptoms photophobia or phonophobia with these headaches, amitriptyline controlled her symptoms until she started having palpitations. She has also been on Topamax with paresthesias ? ? ?REVIEW OF SYSTEMS: Out of a complete 14 system review of symptoms, the patient complains only of the following symptoms, headaches and all other reviewed systems are negative. ? ? ?ALLERGIES: ?Allergies  ?Allergen Reactions  ? Oxycontin [Oxycodone Hcl] Nausea Only  ? Topamax [Topiramate]   ?  Intermittent blurry vision, numbness/tingling, gi upset  ? Metoprolol Nausea Only  ? Nortriptyline Palpitations  ? Propranolol Nausea Only  ? ? ?HOME  MEDICATIONS: ?Outpatient Medications Prior to Visit  ?Medication Sig Dispense Refill  ? acetaminophen (TYLENOL) 500 MG tablet Take 1,000 mg by mouth every 8 (eight) hours as needed for mild pain or headache.    ? amoxicillin (AMOXIL) 500 MG tablet Take 2,000 mg by mouth See admin instructions. Take 2000 mg by mouth 1 hour prior to dental procedure    ? atenolol (TENORMIN) 25 MG tablet Take 2 tablets (50 mg total) by mouth at bedtime. 180 tablet 3  ? Atogepant (QULIPTA) 60 MG TABS Take 60 mg by mouth daily. 90 tablet 3  ? Calcium Carb-Cholecalciferol 600-800 MG-UNIT TABS Take 1 tablet by mouth daily.    ? Cholecalciferol (VITAMIN D) 50 MCG (2000 UT) CAPS Take 2,000 Units by mouth daily.    ? conjugated estrogens (PREMARIN) vaginal cream Place 0.5 g vaginally as directed. Every 2 weeks    ? ibandronate (BONIVA) 150 MG tablet Take 1 tablet by mouth every 30 (thirty) days.    ? meclizine (ANTIVERT) 12.5 MG tablet Take 12.5 mg by mouth 3 (three) times daily as needed for dizziness.    ? Multiple Vitamins-Minerals (PRESERVISION AREDS 2 PO) Take 1 capsule by mouth 2 (two) times daily.     ? naratriptan (AMERGE)  2.5 MG tablet Take 1 tablet (2.5 mg total) by mouth as needed for migraine. Take one (1) tablet at onset of headache; if returns or does not resolve, may repeat after 4 hours; do not exceed five (5) mg in 24 hours. 10 tablet 11  ? promethazine (PHENERGAN) 25 MG tablet TAKE 1 TABLET (25 MG TOTAL) BY MOUTH EVERY 8 (EIGHT) HOURS AS NEEDED FOR NAUSEA. 30 tablet 1  ? UBRELVY 100 MG TABS TAKE 100 MG BY MOUTH EVERY 2 (TWO) HOURS AS NEEDED. MAXIMUM '200MG'$  A DAY. 16 tablet 11  ? Rimegepant Sulfate (NURTEC) 75 MG TBDP Take 75 mg by mouth daily as needed (take for abortive therapy of migraine, no more than 1 tablet in 24 hours or 10 per month). 8 tablet 11  ? ?No facility-administered medications prior to visit.  ? ? ?PAST MEDICAL HISTORY: ?Past Medical History:  ?Diagnosis Date  ? Appendicitis, acute 06/01/2012  ? Basal cell  carcinoma   ? "right shoulder" (02/11/2018)  ? Current use of long term anticoagulation 07/07/2017  ? History of blood transfusion   ? "related to hip replacement" (02/11/2018)  ? Migraine   ? "5-6/year maybe" (3/21

## 2022-04-09 ENCOUNTER — Ambulatory Visit: Payer: Medicare Other | Admitting: Family Medicine

## 2022-04-09 ENCOUNTER — Encounter: Payer: Self-pay | Admitting: Family Medicine

## 2022-04-09 VITALS — BP 116/64 | HR 55 | Ht 63.0 in | Wt 148.0 lb

## 2022-04-09 DIAGNOSIS — G43009 Migraine without aura, not intractable, without status migrainosus: Secondary | ICD-10-CM | POA: Diagnosis not present

## 2022-04-09 DIAGNOSIS — G43709 Chronic migraine without aura, not intractable, without status migrainosus: Secondary | ICD-10-CM

## 2022-04-09 MED ORDER — NURTEC 75 MG PO TBDP: 75.0000 mg | ORAL_TABLET | Freq: Every day | ORAL | 11 refills | Status: DC | PRN

## 2022-04-15 NOTE — Progress Notes (Signed)
Carelink Summary Report / Loop Recorder 

## 2022-04-24 DIAGNOSIS — M79675 Pain in left toe(s): Secondary | ICD-10-CM | POA: Diagnosis not present

## 2022-04-29 DIAGNOSIS — I10 Essential (primary) hypertension: Secondary | ICD-10-CM | POA: Diagnosis not present

## 2022-04-29 DIAGNOSIS — E7800 Pure hypercholesterolemia, unspecified: Secondary | ICD-10-CM | POA: Diagnosis not present

## 2022-04-29 DIAGNOSIS — M810 Age-related osteoporosis without current pathological fracture: Secondary | ICD-10-CM | POA: Diagnosis not present

## 2022-05-05 ENCOUNTER — Ambulatory Visit (INDEPENDENT_AMBULATORY_CARE_PROVIDER_SITE_OTHER): Payer: Medicare Other

## 2022-05-05 DIAGNOSIS — I480 Paroxysmal atrial fibrillation: Secondary | ICD-10-CM

## 2022-05-07 LAB — CUP PACEART REMOTE DEVICE CHECK
Date Time Interrogation Session: 20230605230639
Implantable Pulse Generator Implant Date: 20210902

## 2022-05-08 ENCOUNTER — Ambulatory Visit
Admission: RE | Admit: 2022-05-08 | Discharge: 2022-05-08 | Disposition: A | Discharge status: Home / Self Care | Payer: Medicare Other | Source: Ambulatory Visit | Attending: Otolaryngology | Admitting: Otolaryngology

## 2022-05-08 DIAGNOSIS — J029 Acute pharyngitis, unspecified: Secondary | ICD-10-CM

## 2022-05-08 DIAGNOSIS — M47812 Spondylosis without myelopathy or radiculopathy, cervical region: Secondary | ICD-10-CM | POA: Diagnosis not present

## 2022-05-08 MED ORDER — IOPAMIDOL (ISOVUE-300) INJECTION 61%
75.0000 mL | Freq: Once | INTRAVENOUS | Status: AC | PRN
  Administered 2022-05-08: 75 mL via INTRAVENOUS

## 2022-05-16 NOTE — Progress Notes (Signed)
Carelink Summary Report / Loop Recorder 

## 2022-05-20 ENCOUNTER — Encounter: Payer: Self-pay | Admitting: Family Medicine

## 2022-05-21 MED ORDER — TRUDHESA 0.725 MG/ACT NA AERS: 1.0000 | INHALATION_SPRAY | Freq: Every day | NASAL | 5 refills | Status: DC | PRN

## 2022-05-21 MED ORDER — ATENOLOL 25 MG PO TABS: 75.0000 mg | ORAL_TABLET | Freq: Every day | ORAL | 3 refills | Status: DC

## 2022-05-21 NOTE — Addendum Note (Signed)
Addended by: Debbora Presto L on: 05/21/2022 10:47 AM   Modules accepted: Orders

## 2022-06-02 ENCOUNTER — Encounter: Payer: Self-pay | Admitting: Family Medicine

## 2022-06-02 NOTE — Telephone Encounter (Signed)
[place], MD - [place] Phone: [phone]. States this is Management consultant, [place] to go to [place] location so it can be shipped to pt.

## 2022-06-04 ENCOUNTER — Encounter: Payer: Self-pay | Admitting: Internal Medicine

## 2022-06-04 ENCOUNTER — Ambulatory Visit: Payer: Medicare Other | Admitting: Internal Medicine

## 2022-06-04 VITALS — BP 134/80 | HR 63 | Ht 63.0 in | Wt 148.2 lb

## 2022-06-04 DIAGNOSIS — I10 Essential (primary) hypertension: Secondary | ICD-10-CM | POA: Diagnosis not present

## 2022-06-04 DIAGNOSIS — I4892 Unspecified atrial flutter: Secondary | ICD-10-CM | POA: Diagnosis not present

## 2022-06-04 DIAGNOSIS — I480 Paroxysmal atrial fibrillation: Secondary | ICD-10-CM | POA: Diagnosis not present

## 2022-06-04 HISTORY — PX: OTHER SURGICAL HISTORY: SHX169
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
## 2022-06-04 NOTE — Patient Instructions (Addendum)
Medication Instructions:  Your physician recommends that you continue on your current medications as directed. Please refer to the Current Medication list given to you today.  Labwork: None ordered.  Testing/Procedures: None ordered.  Follow-Up:  Your physician wants you to follow-up in: one year with the Atrial Fib clinic.  You will receive a reminder letter in the mail two months in advance. If you don't receive a letter, please call our office to schedule the follow-up appointment.    Implantable Loop Recorder Removal, Care After This sheet gives you information about how to care for yourself after your procedure. Your health care provider may also give you more specific instructions. If you have problems or questions, contact your health care provider. What can I expect after the procedure? After the procedure, it is common to have: Soreness or discomfort near the incision. Some swelling or bruising near the incision.  Follow these instructions at home: Incision care  Monitor your cardiac device site for redness, swelling, and drainage. Call the device clinic at [phone] if you experience these symptoms or fever/chills.  Keep the large square bandage on your site for 24 hours and then you may remove it yourself. Keep the steri-strips underneath in place.   You may shower after 72 hours / 3 days from your procedure with the steri-strips in place. They will usually fall off on their own, or may be removed after 10 days. Pat dry.   Avoid lotions, ointments, or perfumes over your incision until it is well-healed.  Please do not submerge in water until your site is completely healed.   If your wound site starts to bleed apply pressure.      If you have any questions/concerns please call the device clinic at [phone].  Activity  Return to your normal activities.  Contact a health care provider if: You have redness, swelling, or pain around your incision. You have a  fever.    Medication Instructions:  Your physician recommends that you continue on your current medications as directed. Please refer to the Current Medication list given to you today. *If you need a refill on your cardiac medications before your next appointment, please call your pharmacy*  Lab Work: None ordered. If you have labs (blood work) drawn today and your tests are completely normal, you will receive your results only by: [place] (if you have MyChart) OR A paper copy in the mail If you have any lab test that is abnormal or we need to change your treatment, we will call you to review the results.  Testing/Procedures: None ordered.  Follow-Up: At [facility], you and your health needs are our priority.  As part of our continuing mission to provide you with exceptional heart care, we have created designated Provider Care Teams.  These Care Teams include your primary Cardiologist (physician) and Advanced Practice Providers (APPs -  Physician Assistants and Nurse Practitioners) who all work together to provide you with the care you need, when you need it.  Your next appointment:   Your physician wants you to follow-up in: one year in the [place] will receive a reminder letter in the mail two months in advance. If you don't receive a letter, please call our office to schedule the follow-up appointment.   Important Information About Sugar

## 2022-06-05 ENCOUNTER — Other Ambulatory Visit: Payer: Self-pay | Admitting: Interventional Radiology

## 2022-06-05 DIAGNOSIS — I871 Compression of vein: Secondary | ICD-10-CM

## 2022-06-05 DIAGNOSIS — Z86718 Personal history of other venous thrombosis and embolism: Secondary | ICD-10-CM

## 2022-06-05 LAB — CUP PACEART REMOTE DEVICE CHECK
Date Time Interrogation Session: 20230708230502
Implantable Pulse Generator Implant Date: 20210902

## 2022-06-16 NOTE — Progress Notes (Unsigned)
06/17/2022 ALL: Christina [name] returns for Botox. We increased atenolol to '75mg'$  in 04/2022 after headaches worsened. She continues [place] daily. Nurtec and naratritpan works best for abortive therapy. Trudesha not covered.   03/20/2022 ALL: Christina [name] returns for Botox. We increased atenolol to '50mg'$  daily and continued Qulipta. She reports daily headaches are significantly better. She may have 1 headache per week. She may have 2 migraines on average per month. She uses either Nurtec, ubrelvy or naratriptan for abortive therapy.   12/19/2021 ALL: She returns for Botox. She was seen in follow up with Dr Jaynee Eagles via [place] 12/10/2021 and started on atenolol 12.'5mg'$  daily. She has failed multiple oral and injection preventatives. She continues [place]. She feels that she is doing a little better. She did have a migraine yesterday thought to be related to the weather. Dr Jaynee Eagles gave her Ubrelvy 16 tabs per month. She also has Nurtec and naratriptan which help with abortive therapy. She is aware not to take more than prescribed dose and to try to avoid regular use of abortive agents.   09/16/2021 ALL: She returns for Botox. She is having more frequent and intense migraines. Tummy tuck over the summer then DVTs. Hemoglobin dropped and had GI eval. More stress. About 4-5 migraines a month but some toward end of Emgality/Botox cycle are severe and intractable. Medrol dose pack did not help much last week. Nurtec and naratriptan do help some. Tried and failed topiramate, nortriptyline, propranolol, metoprolol, Amovig, Emgality, Ajovy, gabapentin, sumatriptan, rizatriptan, Botox. We will try Qulipta '60mg'$  daily.  06/12/2021 ALL: She returns for Botox procedure. She had difficulty with an intractable migraines that lasted for over a week. It was finally aborted with migraine cocktail and steroid taper. She usually has about 1 migraine per month that is easily aborted with Nurtec. She is feeling well, today.   02/27/2021 ALL: She  continues to do well on Botox. Greater than 50% reduction in migraines. Emgality and rizatriptan working well.   11/29/2020 ALL: She continues to do well. She continues Botox and Emgality. Rizatriptan works well for abortive therapy. Average 1 migraine per month.    Consent Form Botulism Toxin Injection For Chronic Migraine    Reviewed orally with patient, additionally signature is on file:  Botulism toxin has been approved by the Federal drug administration for treatment of chronic migraine. Botulism toxin does not cure chronic migraine and it may not be effective in some patients.  The administration of botulism toxin is accomplished by injecting a small amount of toxin into the muscles of the neck and head. Dosage must be titrated for each individual. Any benefits resulting from botulism toxin tend to wear off after 3 months with a repeat injection required if benefit is to be maintained. Injections are usually done every 3-4 months with maximum effect peak achieved by about 2 or 3 weeks. Botulism toxin is expensive and you should be sure of what costs you will incur resulting from the injection.  The side effects of botulism toxin use for chronic migraine may include:   -Transient, and usually mild, facial weakness with facial injections  -Transient, and usually mild, head or neck weakness with head/neck injections  -Reduction or loss of forehead facial animation due to forehead muscle weakness  -Eyelid drooping  -Dry eye  -Pain at the site of injection or bruising at the site of injection  -Double vision  -Potential unknown long term risks   Contraindications: You should not have Botox if you are pregnant, nursing, allergic to  albumin, have an infection, skin condition, or muscle weakness at the site of the injection, or have myasthenia gravis, Lambert-Eaton syndrome, or ALS.  It is also possible that as with any injection, there may be an allergic reaction or no effect from the  medication. Reduced effectiveness after repeated injections is sometimes seen and rarely infection at the injection site may occur. All care will be taken to prevent these side effects. If therapy is given over a long time, atrophy and wasting in the muscle injected may occur. Occasionally the patient's become refractory to treatment because they develop antibodies to the toxin. In this event, therapy needs to be modified.  I have read the above information and consent to the administration of botulism toxin.    BOTOX PROCEDURE NOTE FOR MIGRAINE HEADACHE  Contraindications and precautions discussed with patient(above). Aseptic procedure was observed and patient tolerated procedure. Procedure performed by Debbora Presto, FNP-C.   The condition has existed for more than 6 months, and pt does not have a diagnosis of ALS, Myasthenia Gravis or Lambert-Eaton Syndrome.  Risks and benefits of injections discussed and pt agrees to proceed with the procedure.  Written consent obtained  These injections are medically necessary. Pt  receives good benefits from these injections. These injections do not cause sedations or hallucinations which the oral therapies may cause.   Description of procedure:  The patient was placed in a sitting position. The standard protocol was used for Botox as follows, with 5 units of Botox injected at each site:  -Procerus muscle, midline injection  -Corrugator muscle, bilateral injection  -Frontalis muscle, bilateral injection, with 2 sites each side, medial injection was performed in the upper one third of the frontalis muscle, in the region vertical from the medial inferior edge of the superior orbital rim. The lateral injection was again in the upper one third of the forehead vertically above the lateral limbus of the cornea, 1.5 cm lateral to the medial injection site.  -Temporalis muscle injection, 4 sites, bilaterally. The first injection was 3 cm above the tragus of the  ear, second injection site was 1.5 cm to 3 cm up from the first injection site in line with the tragus of the ear. The third injection site was 1.5-3 cm forward between the first 2 injection sites. The fourth injection site was 1.5 cm posterior to the second injection site. 5th site laterally in the temporalis  muscleat the level of the outer canthus.  -Occipitalis muscle injection, 3 sites, bilaterally. The first injection was done one half way between the occipital protuberance and the tip of the mastoid process behind the ear. The second injection site was done lateral and superior to the first, 1 fingerbreadth from the first injection. The third injection site was 1 fingerbreadth superiorly and medially from the first injection site.  -Cervical paraspinal muscle injection, 2 sites, bilaterally. The first injection site was 1 cm from the midline of the cervical spine, 3 cm inferior to the lower border of the occipital protuberance. The second injection site was 1.5 cm superiorly and laterally to the first injection site.  -Trapezius muscle injection was performed at 3 sites, bilaterally. The first injection site was in the upper trapezius muscle halfway between the inflection point of the neck, and the acromion. The second injection site was one half way between the acromion and the first injection site. The third injection was done between the first injection site and the inflection point of the neck.   Will return for  repeat injection in 3 months.   A total of 200 units of Botox was prepared, 155 units of Botox was injected as documented above, any Botox not injected was wasted. The patient tolerated the procedure well, there were no complications of the above procedure.

## 2022-06-17 ENCOUNTER — Ambulatory Visit: Payer: Medicare Other | Admitting: Family Medicine

## 2022-06-17 ENCOUNTER — Encounter: Payer: Self-pay | Admitting: Family Medicine

## 2022-06-17 DIAGNOSIS — G43709 Chronic migraine without aura, not intractable, without status migrainosus: Secondary | ICD-10-CM | POA: Diagnosis not present

## 2022-06-17 MED ORDER — ONABOTULINUMTOXINA 200 UNITS IJ SOLR
200.0000 [IU] | Freq: Once | INTRAMUSCULAR | Status: AC
  Administered 2022-06-17: 155 [IU] via INTRAMUSCULAR

## 2022-06-17 NOTE — Progress Notes (Signed)
Botox- 200 units x 1 vial Lot: [id number] Expiration: 12/2024 NDC: 7782-4235-36  Bacteriostatic 0.9% Sodium Chloride- 44m total Lot: [id number]: 07/26/2023 NDC: [address]-19 Dx: GJ09.326B/B

## 2022-06-18 ENCOUNTER — Ambulatory Visit (HOSPITAL_COMMUNITY)
Admission: RE | Admit: 2022-06-18 | Discharge: 2022-06-18 | Disposition: A | Discharge status: Home / Self Care | Payer: Medicare Other | Source: Ambulatory Visit | Attending: Interventional Radiology | Admitting: Interventional Radiology

## 2022-06-18 ENCOUNTER — Encounter (HOSPITAL_COMMUNITY): Payer: Self-pay

## 2022-06-18 DIAGNOSIS — T82856A Stenosis of peripheral vascular stent, initial encounter: Secondary | ICD-10-CM | POA: Diagnosis not present

## 2022-06-18 DIAGNOSIS — Z86718 Personal history of other venous thrombosis and embolism: Secondary | ICD-10-CM | POA: Insufficient documentation

## 2022-06-18 DIAGNOSIS — K5730 Diverticulosis of large intestine without perforation or abscess without bleeding: Secondary | ICD-10-CM | POA: Diagnosis not present

## 2022-06-18 DIAGNOSIS — I871 Compression of vein: Secondary | ICD-10-CM | POA: Insufficient documentation

## 2022-06-18 MED ORDER — SODIUM CHLORIDE (PF) 0.9 % IJ SOLN
INTRAMUSCULAR | Status: AC
  Filled 2022-06-18: qty 50

## 2022-06-18 MED ORDER — IOHEXOL 350 MG/ML SOLN
100.0000 mL | Freq: Once | INTRAVENOUS | Status: AC | PRN
  Administered 2022-06-18: 100 mL via INTRAVENOUS

## 2022-06-19 DIAGNOSIS — R490 Dysphonia: Secondary | ICD-10-CM | POA: Diagnosis not present

## 2022-06-19 DIAGNOSIS — J029 Acute pharyngitis, unspecified: Secondary | ICD-10-CM | POA: Diagnosis not present

## 2022-06-24 ENCOUNTER — Encounter: Payer: Self-pay | Admitting: *Deleted

## 2022-06-24 ENCOUNTER — Other Ambulatory Visit (HOSPITAL_COMMUNITY): Payer: Self-pay | Admitting: Interventional Radiology

## 2022-06-24 ENCOUNTER — Ambulatory Visit
Admission: RE | Admit: 2022-06-24 | Discharge: 2022-06-24 | Disposition: A | Discharge status: Home / Self Care | Payer: Medicare Other | Source: Ambulatory Visit | Attending: Interventional Radiology | Admitting: Interventional Radiology

## 2022-06-24 DIAGNOSIS — Z9889 Other specified postprocedural states: Secondary | ICD-10-CM | POA: Diagnosis not present

## 2022-06-24 DIAGNOSIS — I871 Compression of vein: Secondary | ICD-10-CM | POA: Diagnosis not present

## 2022-06-24 DIAGNOSIS — Z86718 Personal history of other venous thrombosis and embolism: Secondary | ICD-10-CM

## 2022-06-24 HISTORY — PX: IR RADIOLOGIST EVAL & MGMT: IMG5224

## 2022-06-24 MED ORDER — APIXABAN 5 MG PO TABS: 5.0000 mg | ORAL_TABLET | Freq: Two times a day (BID) | ORAL | 5 refills | Status: DC
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
## 2022-06-26 ENCOUNTER — Other Ambulatory Visit: Payer: Self-pay | Admitting: Interventional Radiology

## 2022-06-26 DIAGNOSIS — I871 Compression of vein: Secondary | ICD-10-CM

## 2022-07-01 ENCOUNTER — Other Ambulatory Visit: Payer: Self-pay | Admitting: Family Medicine

## 2022-07-04 ENCOUNTER — Other Ambulatory Visit (HOSPITAL_COMMUNITY): Payer: Self-pay | Admitting: Interventional Radiology

## 2022-07-04 DIAGNOSIS — I82402 Acute embolism and thrombosis of unspecified deep veins of left lower extremity: Secondary | ICD-10-CM

## 2022-07-07 ENCOUNTER — Other Ambulatory Visit (HOSPITAL_COMMUNITY): Payer: Self-pay | Admitting: Physician Assistant

## 2022-07-07 DIAGNOSIS — E559 Vitamin D deficiency, unspecified: Secondary | ICD-10-CM | POA: Diagnosis not present

## 2022-07-07 DIAGNOSIS — M810 Age-related osteoporosis without current pathological fracture: Secondary | ICD-10-CM | POA: Diagnosis not present

## 2022-07-07 DIAGNOSIS — Z01818 Encounter for other preprocedural examination: Secondary | ICD-10-CM

## 2022-07-08 ENCOUNTER — Other Ambulatory Visit (HOSPITAL_COMMUNITY): Payer: Self-pay | Admitting: Interventional Radiology

## 2022-07-08 ENCOUNTER — Ambulatory Visit (HOSPITAL_COMMUNITY)
Admission: RE | Admit: 2022-07-08 | Discharge: 2022-07-08 | Disposition: A | Discharge status: Home / Self Care | Payer: Medicare Other | Source: Ambulatory Visit | Attending: Interventional Radiology | Admitting: Interventional Radiology

## 2022-07-08 ENCOUNTER — Encounter (HOSPITAL_COMMUNITY): Payer: Self-pay

## 2022-07-08 DIAGNOSIS — Y832 Surgical operation with anastomosis, bypass or graft as the cause of abnormal reaction of the patient, or of later complication, without mention of misadventure at the time of the procedure: Secondary | ICD-10-CM | POA: Diagnosis not present

## 2022-07-08 DIAGNOSIS — I82402 Acute embolism and thrombosis of unspecified deep veins of left lower extremity: Secondary | ICD-10-CM

## 2022-07-08 DIAGNOSIS — Z01818 Encounter for other preprocedural examination: Secondary | ICD-10-CM

## 2022-07-08 DIAGNOSIS — Z7901 Long term (current) use of anticoagulants: Secondary | ICD-10-CM | POA: Insufficient documentation

## 2022-07-08 DIAGNOSIS — Z7982 Long term (current) use of aspirin: Secondary | ICD-10-CM | POA: Insufficient documentation

## 2022-07-08 DIAGNOSIS — T82858A Stenosis of vascular prosthetic devices, implants and grafts, initial encounter: Secondary | ICD-10-CM | POA: Diagnosis not present

## 2022-07-08 DIAGNOSIS — I871 Compression of vein: Secondary | ICD-10-CM | POA: Diagnosis not present

## 2022-07-08 DIAGNOSIS — I82422 Acute embolism and thrombosis of left iliac vein: Secondary | ICD-10-CM | POA: Diagnosis not present

## 2022-07-08 HISTORY — PX: IR US GUIDE VASC ACCESS LEFT: IMG2389

## 2022-07-08 HISTORY — PX: IR TRANSCATH PLC STENT 1ST ART NOT LE CV CAR VERT CAR: IMG5443

## 2022-07-08 HISTORY — PX: IR THROMBECT VENO MECH MOD SED: IMG2300

## 2022-07-08 HISTORY — PX: IR INTRAVASCULAR ULTRASOUND NON CORONARY: IMG6085

## 2022-07-08 HISTORY — PX: IR VENO/EXT/UNI LEFT: IMG675

## 2022-07-08 LAB — CBC
HCT: 42 % (ref 36.0–46.0)
Hemoglobin: 13.6 g/dL (ref 12.0–15.0)
MCH: 30 pg (ref 26.0–34.0)
MCHC: 32.4 g/dL (ref 30.0–36.0)
MCV: 92.5 fL (ref 80.0–100.0)
Platelets: 277 10*3/uL (ref 150–400)
RBC: 4.54 MIL/uL (ref 3.87–5.11)
RDW: 13.3 % (ref 11.5–15.5)
WBC: 8.7 10*3/uL (ref 4.0–10.5)
nRBC: 0 % (ref 0.0–0.2)

## 2022-07-08 LAB — BASIC METABOLIC PANEL
Anion gap: 10 (ref 5–15)
BUN: 17 mg/dL (ref 8–23)
CO2: 26 mmol/L (ref 22–32)
Calcium: 9.4 mg/dL (ref 8.9–10.3)
Chloride: 105 mmol/L (ref 98–111)
Creatinine, Ser: 0.82 mg/dL (ref 0.44–1.00)
GFR, Estimated: >60 mL/min (ref >60)
Glucose, Bld: 99 mg/dL (ref 70–99)
Potassium: 4.2 mmol/L (ref 3.5–5.1)
Sodium: 141 mmol/L (ref 135–145)

## 2022-07-08 MED ORDER — IODIXANOL 320 MG/ML IV SOLN
100.0000 mL | Freq: Once | INTRAVENOUS | Status: AC | PRN
  Administered 2022-07-08: 56 mL via INTRAVENOUS

## 2022-07-08 MED ORDER — MIDAZOLAM HCL 2 MG/2ML IJ SOLN
INTRAMUSCULAR | Status: AC
  Filled 2022-07-08: qty 2

## 2022-07-08 MED ORDER — SODIUM CHLORIDE 0.9 % IV SOLN: INTRAVENOUS | Status: DC

## 2022-07-08 MED ORDER — MIDAZOLAM HCL 2 MG/2ML IJ SOLN
INTRAMUSCULAR | Status: AC | PRN
  Administered 2022-07-08: .5 mg via INTRAVENOUS

## 2022-07-08 MED ORDER — MIDAZOLAM HCL 2 MG/2ML IJ SOLN
INTRAMUSCULAR | Status: AC | PRN
  Administered 2022-07-08: 1 mg via INTRAVENOUS
  Administered 2022-07-08: .5 mg via INTRAVENOUS

## 2022-07-08 MED ORDER — FENTANYL CITRATE (PF) 100 MCG/2ML IJ SOLN
INTRAMUSCULAR | Status: AC
  Filled 2022-07-08: qty 2

## 2022-07-08 MED ORDER — LIDOCAINE-EPINEPHRINE 1 %-1:100000 IJ SOLN
INTRAMUSCULAR | Status: AC
  Filled 2022-07-08: qty 1

## 2022-07-08 MED ORDER — FENTANYL CITRATE (PF) 100 MCG/2ML IJ SOLN
INTRAMUSCULAR | Status: AC | PRN
  Administered 2022-07-08: 25 ug via INTRAVENOUS
  Administered 2022-07-08: 50 ug via INTRAVENOUS
  Administered 2022-07-08: 25 ug via INTRAVENOUS

## 2022-07-08 NOTE — Procedures (Signed)
Interventional Radiology Procedure Note  Procedure:  1) Left lower extremity venogram 2) Intravascular ultrasound 3) Aspiration thrombectomy 4) Balloon angioplasty 5) Left common to external iliac vein stent placement  Findings: Please refer to procedural dictation for full description. In-stent stenosis of indwelling left iliac stent with approximately 50% stenosis.  Aspiration thrombectomy followed by balloon angioplasty then stent relining, 16 mm centrally, 14 mm distally.  Widely patent upon completion.  Perclose of left greater saphenous vein access x2.  Complications: None immediate  Estimated Blood Loss:  200 mL  Recommendations: Head of bed up to 30 degrees for 2 hours bedrest, then ambulate as tolerated. Continue Eliquis, ASA. Follow up in 1 month in IR clinic with repeat CTV AP.   Ruthann Cancer, MD Pager: [phone]
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
## 2022-07-17 DIAGNOSIS — K5732 Diverticulitis of large intestine without perforation or abscess without bleeding: Secondary | ICD-10-CM | POA: Diagnosis not present

## 2022-07-17 DIAGNOSIS — R1032 Left lower quadrant pain: Secondary | ICD-10-CM | POA: Diagnosis not present

## 2022-07-21 ENCOUNTER — Ambulatory Visit
Admission: RE | Admit: 2022-07-21 | Discharge: 2022-07-21 | Disposition: A | Discharge status: Home / Self Care | Payer: Medicare Other | Source: Ambulatory Visit | Attending: Interventional Radiology | Admitting: Interventional Radiology

## 2022-07-21 ENCOUNTER — Encounter: Payer: Self-pay | Admitting: *Deleted

## 2022-07-21 ENCOUNTER — Other Ambulatory Visit (HOSPITAL_COMMUNITY): Payer: Self-pay | Admitting: Interventional Radiology

## 2022-07-21 DIAGNOSIS — I82402 Acute embolism and thrombosis of unspecified deep veins of left lower extremity: Secondary | ICD-10-CM

## 2022-07-21 DIAGNOSIS — R1032 Left lower quadrant pain: Secondary | ICD-10-CM | POA: Diagnosis not present

## 2022-07-21 HISTORY — PX: IR RADIOLOGIST EVAL & MGMT: IMG5224
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
## 2022-07-22 DIAGNOSIS — R7989 Other specified abnormal findings of blood chemistry: Secondary | ICD-10-CM | POA: Diagnosis not present

## 2022-07-22 DIAGNOSIS — K5732 Diverticulitis of large intestine without perforation or abscess without bleeding: Secondary | ICD-10-CM | POA: Diagnosis not present

## 2022-07-22 DIAGNOSIS — R1032 Left lower quadrant pain: Secondary | ICD-10-CM | POA: Diagnosis not present

## 2022-07-23 ENCOUNTER — Other Ambulatory Visit (HOSPITAL_COMMUNITY): Payer: Self-pay | Admitting: Otolaryngology

## 2022-07-23 ENCOUNTER — Other Ambulatory Visit: Payer: Self-pay | Admitting: Otolaryngology

## 2022-07-23 DIAGNOSIS — J351 Hypertrophy of tonsils: Secondary | ICD-10-CM

## 2022-07-23 DIAGNOSIS — R070 Pain in throat: Secondary | ICD-10-CM | POA: Diagnosis not present

## 2022-07-25 DIAGNOSIS — Z7901 Long term (current) use of anticoagulants: Secondary | ICD-10-CM | POA: Diagnosis not present

## 2022-07-25 DIAGNOSIS — G43909 Migraine, unspecified, not intractable, without status migrainosus: Secondary | ICD-10-CM | POA: Diagnosis not present

## 2022-07-25 DIAGNOSIS — E7800 Pure hypercholesterolemia, unspecified: Secondary | ICD-10-CM | POA: Diagnosis not present

## 2022-07-25 DIAGNOSIS — M810 Age-related osteoporosis without current pathological fracture: Secondary | ICD-10-CM | POA: Diagnosis not present

## 2022-07-25 DIAGNOSIS — K5732 Diverticulitis of large intestine without perforation or abscess without bleeding: Secondary | ICD-10-CM | POA: Diagnosis not present

## 2022-07-25 DIAGNOSIS — I480 Paroxysmal atrial fibrillation: Secondary | ICD-10-CM | POA: Diagnosis not present

## 2022-07-25 DIAGNOSIS — R1032 Left lower quadrant pain: Secondary | ICD-10-CM | POA: Diagnosis not present

## 2022-07-26 ENCOUNTER — Ambulatory Visit (HOSPITAL_COMMUNITY)
Admission: RE | Admit: 2022-07-26 | Discharge: 2022-07-26 | Disposition: A | Discharge status: Home / Self Care | Payer: Medicare Other | Source: Ambulatory Visit | Attending: Otolaryngology | Admitting: Otolaryngology

## 2022-07-26 DIAGNOSIS — J351 Hypertrophy of tonsils: Secondary | ICD-10-CM | POA: Diagnosis not present

## 2022-07-26 DIAGNOSIS — R070 Pain in throat: Secondary | ICD-10-CM | POA: Diagnosis not present

## 2022-07-26 DIAGNOSIS — Z0389 Encounter for observation for other suspected diseases and conditions ruled out: Secondary | ICD-10-CM | POA: Diagnosis not present

## 2022-07-26 MED ORDER — GADOBUTROL 1 MMOL/ML IV SOLN
6.0000 mL | Freq: Once | INTRAVENOUS | Status: AC | PRN
  Administered 2022-07-26: 6 mL via INTRAVENOUS

## 2022-07-30 ENCOUNTER — Encounter (HOSPITAL_COMMUNITY): Payer: Self-pay

## 2022-07-30 ENCOUNTER — Ambulatory Visit (HOSPITAL_COMMUNITY)
Admission: RE | Admit: 2022-07-30 | Discharge: 2022-07-30 | Disposition: A | Discharge status: Home / Self Care | Payer: Medicare Other | Source: Ambulatory Visit | Attending: Interventional Radiology | Admitting: Interventional Radiology

## 2022-07-30 DIAGNOSIS — I700 Atherosclerosis of aorta: Secondary | ICD-10-CM | POA: Diagnosis not present

## 2022-07-30 DIAGNOSIS — I82402 Acute embolism and thrombosis of unspecified deep veins of left lower extremity: Secondary | ICD-10-CM | POA: Diagnosis not present

## 2022-07-30 DIAGNOSIS — I70202 Unspecified atherosclerosis of native arteries of extremities, left leg: Secondary | ICD-10-CM | POA: Diagnosis not present

## 2022-07-30 DIAGNOSIS — Z9049 Acquired absence of other specified parts of digestive tract: Secondary | ICD-10-CM | POA: Diagnosis not present

## 2022-07-30 MED ORDER — IOHEXOL 350 MG/ML SOLN
100.0000 mL | Freq: Once | INTRAVENOUS | Status: AC | PRN
  Administered 2022-07-30: 100 mL via INTRAVENOUS

## 2022-07-30 MED ORDER — SODIUM CHLORIDE (PF) 0.9 % IJ SOLN
INTRAMUSCULAR | Status: AC
  Filled 2022-07-30: qty 50

## 2022-08-01 ENCOUNTER — Ambulatory Visit
Admission: RE | Admit: 2022-08-01 | Discharge: 2022-08-01 | Disposition: A | Discharge status: Home / Self Care | Payer: Medicare Other | Source: Ambulatory Visit | Attending: Interventional Radiology | Admitting: Interventional Radiology

## 2022-08-01 ENCOUNTER — Encounter: Payer: Self-pay | Admitting: *Deleted

## 2022-08-01 DIAGNOSIS — I871 Compression of vein: Secondary | ICD-10-CM | POA: Diagnosis not present

## 2022-08-01 DIAGNOSIS — Z9889 Other specified postprocedural states: Secondary | ICD-10-CM | POA: Diagnosis not present

## 2022-08-01 DIAGNOSIS — I82402 Acute embolism and thrombosis of unspecified deep veins of left lower extremity: Secondary | ICD-10-CM

## 2022-08-01 HISTORY — PX: IR RADIOLOGIST EVAL & MGMT: IMG5224
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
## 2022-08-06 DIAGNOSIS — Z9049 Acquired absence of other specified parts of digestive tract: Secondary | ICD-10-CM | POA: Diagnosis not present

## 2022-08-06 DIAGNOSIS — A429 Actinomycosis, unspecified: Secondary | ICD-10-CM | POA: Diagnosis not present

## 2022-08-06 DIAGNOSIS — J312 Chronic pharyngitis: Secondary | ICD-10-CM | POA: Diagnosis not present

## 2022-08-14 DIAGNOSIS — R110 Nausea: Secondary | ICD-10-CM | POA: Diagnosis not present

## 2022-08-28 IMAGING — CT CT VENOGRAM ABD-PELV
2 of 9 series · 11 of 46 positions shown, 15 images · IV contrast (omnipaque)
Comparison: 05/07/2012

CLINICAL DATA: 70-year-old with left groin and calf pain 3 weeks
after abdominoplasty surgery. Patient has DVT in the left lower
extremity. CT venogram needed to evaluate for Moatshe syndrome
and left iliac venous thrombosis.

EXAM:
CT VENOGRAM ABDOMEN AND PELVIS
TECHNIQUE: Multiplanar imaging of the abdomen and pelvis was obtained following
administration of intravenous contrast. Venogram protocol was used.
CONTRAST:  100mL OMNIPAQUE IOHEXOL 350 MG/ML SOLN

[Series 3: axial venous · axial · portal-venous · 0.80mm/px · z∈[+1102,+1452]mm · 9 of 84 slices shown, 13 images]
[im 7/84  soft-tissue]
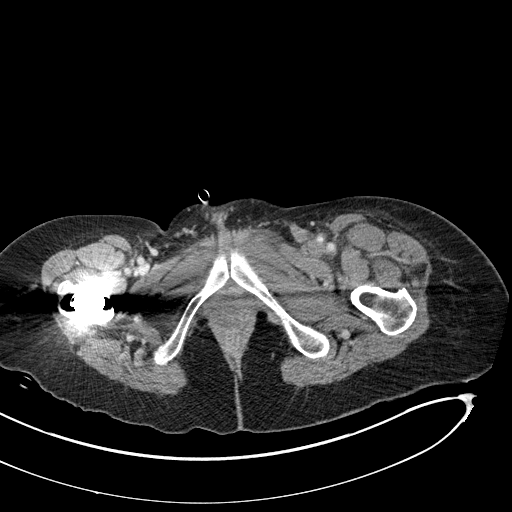
[im 7/84  bone]
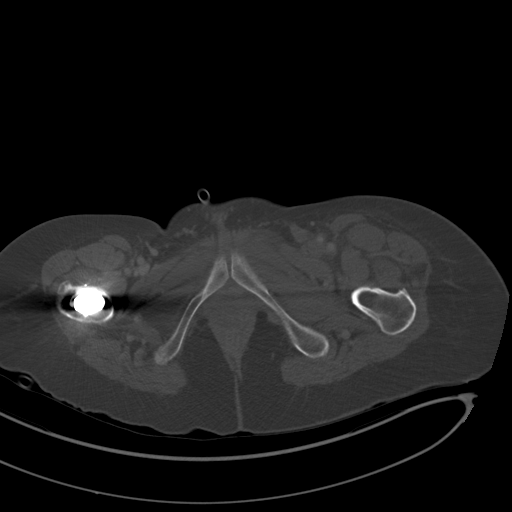
[im 20/84  soft-tissue]
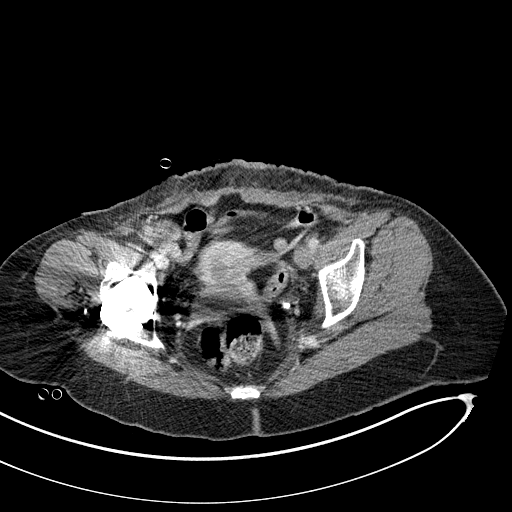
[im 26/84  soft-tissue]
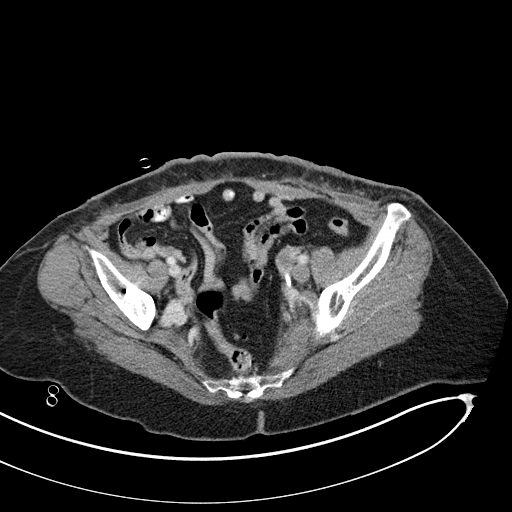
[im 39/84  soft-tissue]
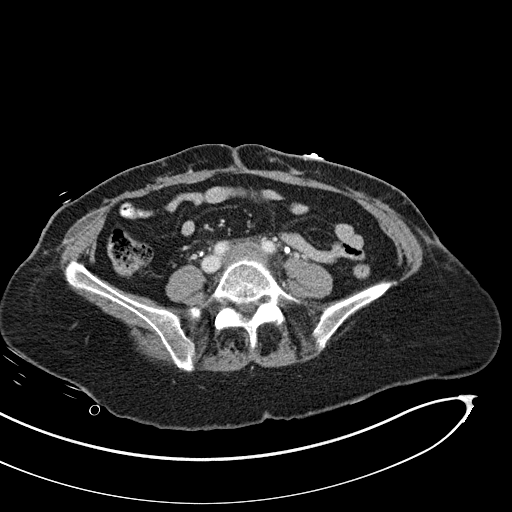
[im 45/84  soft-tissue]
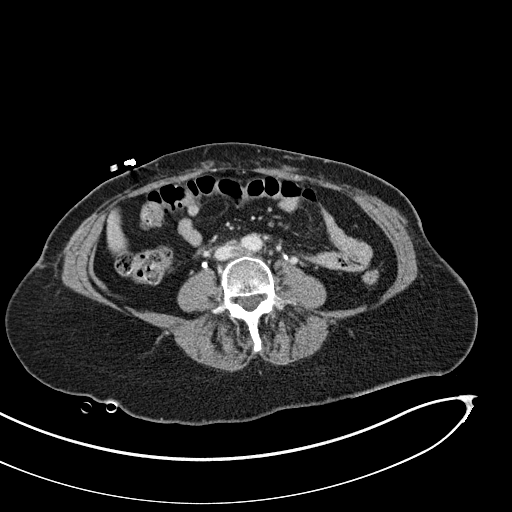
[im 58/84  soft-tissue]
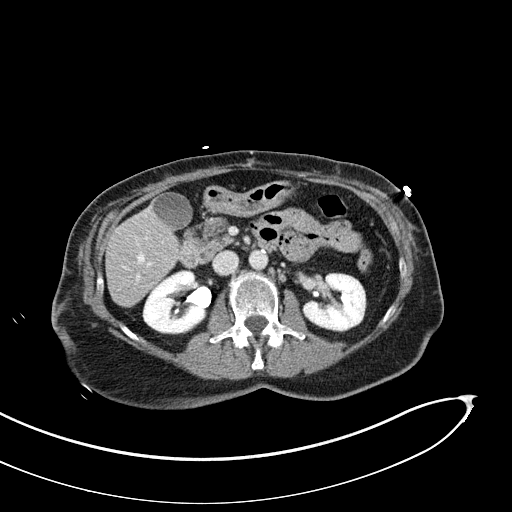
[im 58/84  lung]
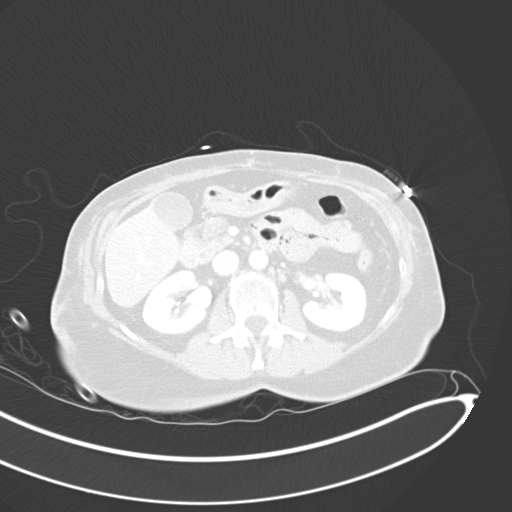
[im 64/84  soft-tissue]
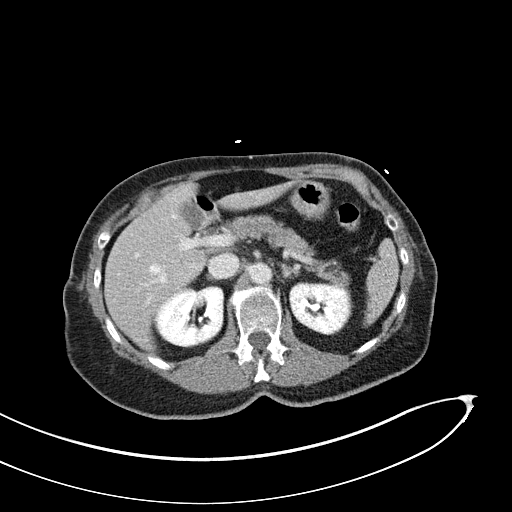
[im 64/84  lung]
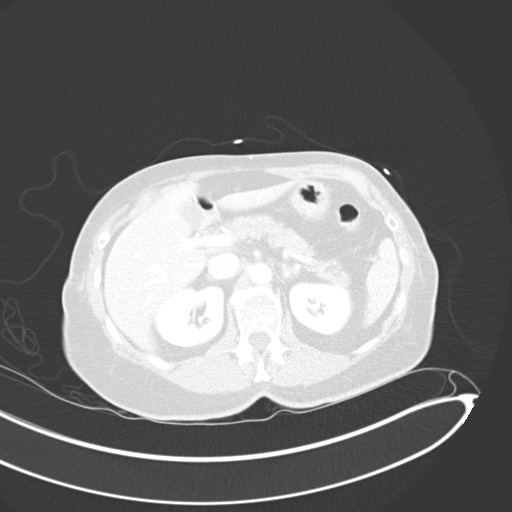
[im 71/84  lung]
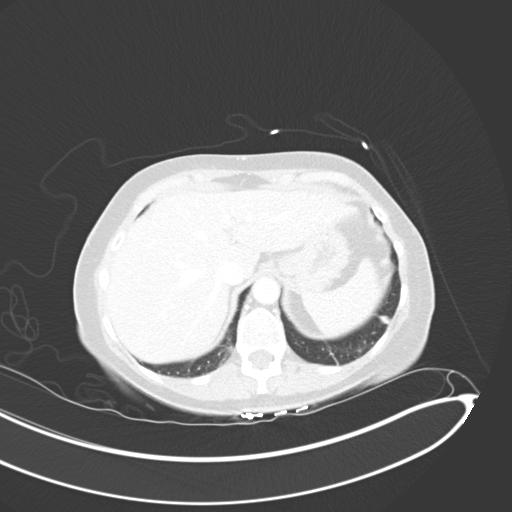
[im 77/84  soft-tissue]
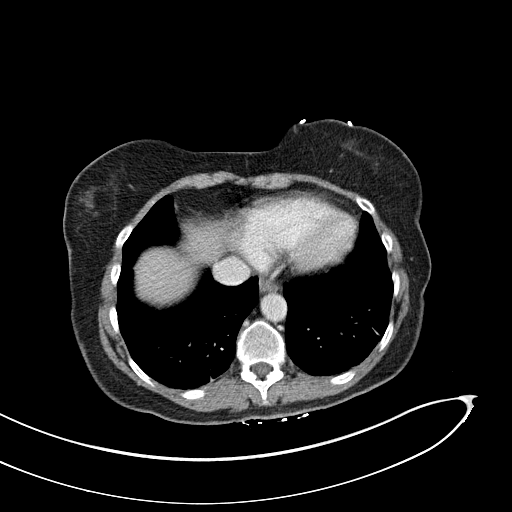
[im 77/84  lung]
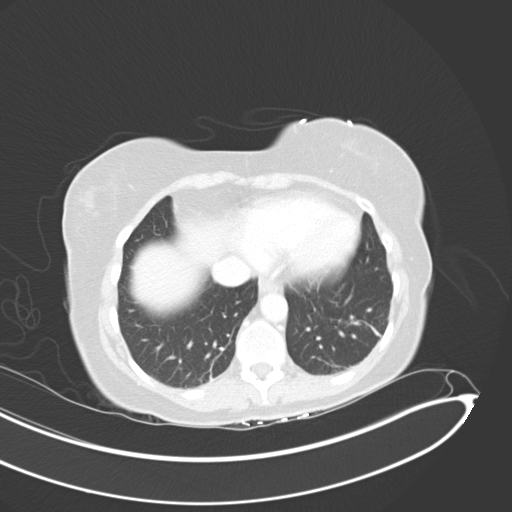

[Series 6: coronals · coronal · 0.51mm/px · 2 of 119 slices shown]
[im 40/119  soft-tissue]
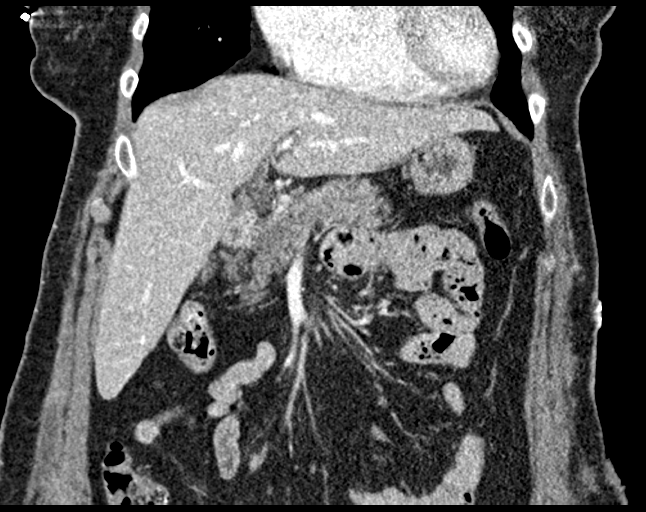
[im 79/119  soft-tissue]
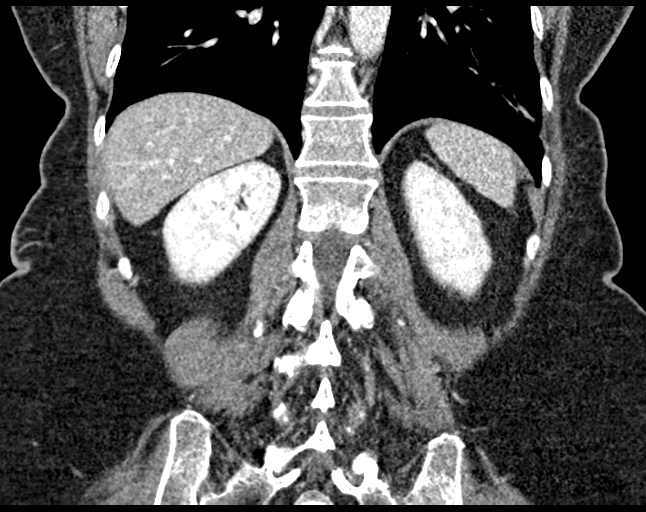

[11 of 46 positions shown; findings below may reference images not displayed]

FINDINGS: Lung bases: Linear densities at lung bases are suggestive for
atelectasis. Limited evaluation for pulmonary emboli. Questionable
small filling defect in the left lower lobe segmental or
subsegmental pulmonary artery on sequence 5 image 2.

Hepatobiliary: Slightly decreased attenuation of the liver. Normal
appearance of the gallbladder. No suspicious hepatic lesion. Hepatic
veins and portal venous system is patent.

Pancreas: No evidence for acute inflammation or duct dilatation.

Spleen: Normal size.  Single small calcification.

Adrenals and kidneys: Normal adrenal glands. Negative for
hydronephrosis. Small hypodensity in the posterior left kidney
likely represents a cyst. No suspicious renal lesions. Normal
appearance of the urinary bladder. Evaluation of bladder is limited
due to right hip arthroplasty artifact.

Stomach and bowel structures: Normal appearance of the stomach.
Suspect previous appendectomy. No evidence for bowel inflammation or
obstruction.

Lymphatics: No abdominopelvic lymphadenopathy.

Arterial structures: Atherosclerotic calcifications in the abdominal
aorta without aneurysm. Main visceral arteries are patent.

Venous structures: Thrombosis of the left common iliac vein and left
external iliac vein. Proximal left femoral veins are occluded.
Expansion and thrombosis of the proximal left great saphenous vein.
Right common femoral vein is patent. Right iliac veins are patent.
Small focus of thrombus at the junction of the IVC and left common
iliac vein. Bilateral renal veins are patent. Incidentally, there is
a low lying left retroaortic renal vein which is patent.
Inflammatory changes around the proximal aspect of the left common
iliac vein thrombosis.

Other: Negative for ascites. Stranding in the anterior abdominal
soft subcutaneous tissues compatible with recent abdominoplasty
surgery. There are tiny low-density collections along the lateral
abdominal wall, right side greater than left. Right-sided small
fluid collection measures roughly 6 mm in thickness. The left-sided
collection measures roughly 5 mm in thickness. No evidence for
intra-abdominal ascites. Negative for free air.

Reproductive: Hypodense structure along the lower aspect the uterus
measures roughly 2.0 cm and could represent a uterine fibroid. No
evidence for an adnexal mass.

Musculoskeletal: Right hip replacement is located. No acute bone
abnormality.
IMPRESSION: 1. Positive for deep venous thrombosis involving the left iliac
venous system. There appears to be narrowing and compression on the
proximal left common iliac vein from the right common iliac artery
and the configuration is compatible with Moatshe syndrome.
Inflammatory changes around the left common iliac vein thrombosis.
Tiny focus of thrombus extends into the distal IVC at the IVC and
left common iliac vein junction. Majority of the IVC is widely
patent.
2. Question a tiny filling defect in a left lower lobe pulmonary
artery. This study was not designed to evaluate for pulmonary
embolism but cannot exclude small focus of pulmonary embolism at
this location. This could be further characterized with a chest CTA.
3. Expected postsurgical changes from recent abdominoplasty surgery.
Small fluid collections along the lateral lower abdomen bilaterally.

These results were called by telephone at the time of interpretation
on 06/25/2021 at [DATE] to provider DANOR JOODA , who verbally
acknowledged these results.

## 2022-08-28 IMAGING — CT CT ANGIO CHEST
2 of 6 series · 18 of 36 positions shown · IV contrast (omnipaque)
Comparison: 06/25/2021, 02/08/2018

CLINICAL DATA: 70-year-old female with left iliofemoral deep vein
thrombosis, possible pulmonary embolism on abdomen pelvis CT
venogram.

EXAM:
CT ANGIOGRAPHY CHEST WITH CONTRAST
TECHNIQUE: Multidetector CT imaging of the chest was performed using the
standard protocol during bolus administration of intravenous
contrast. Multiplanar CT image reconstructions and MIPs were
obtained to evaluate the vascular anatomy.
CONTRAST:  Seventy-five mL Omnipaque 350, intravenous

[Series 5: thins · axial · 0.62mm/px · z∈[+1214,+1442]mm · 17 of 257 slices shown]
[im 15/257  lung]
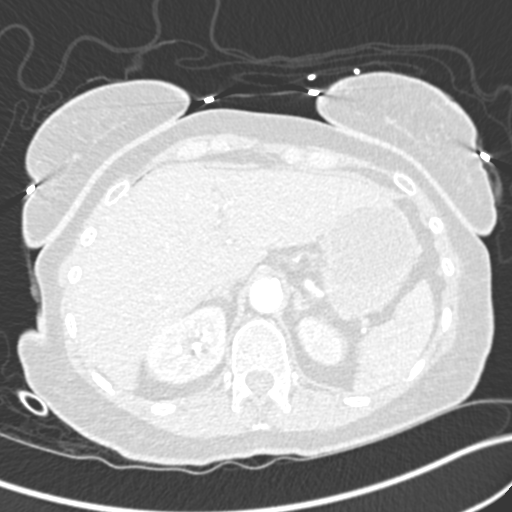
[im 29/257  mediastinal]
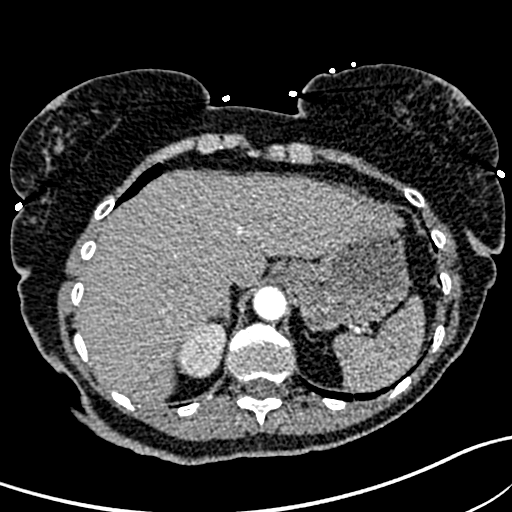
[im 43/257  lung]
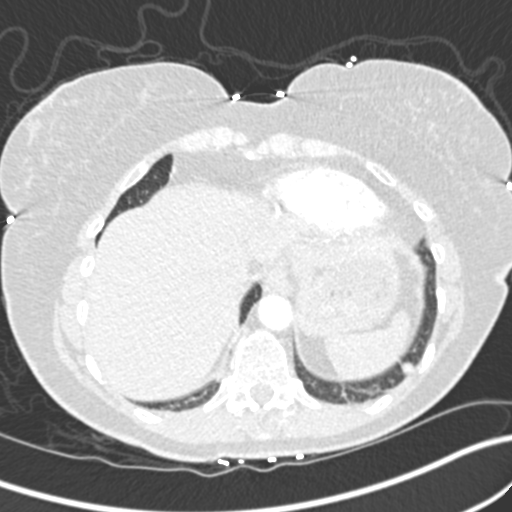
[im 57/257  mediastinal]
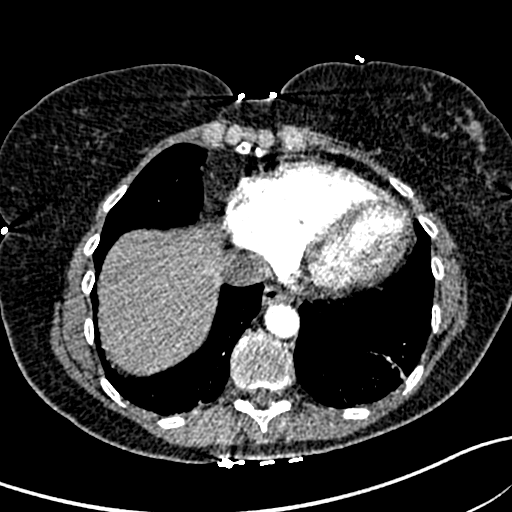
[im 72/257  lung]
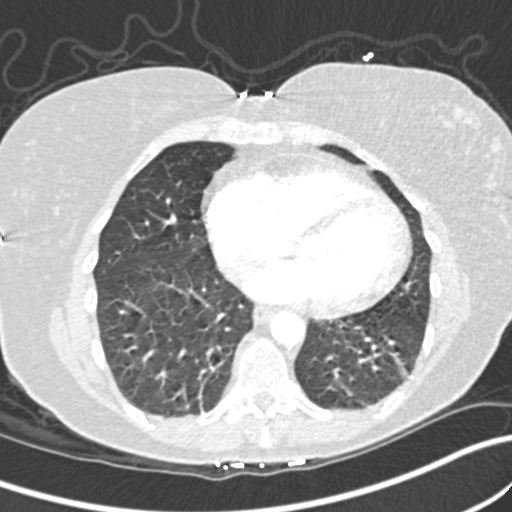
[im 86/257  mediastinal]
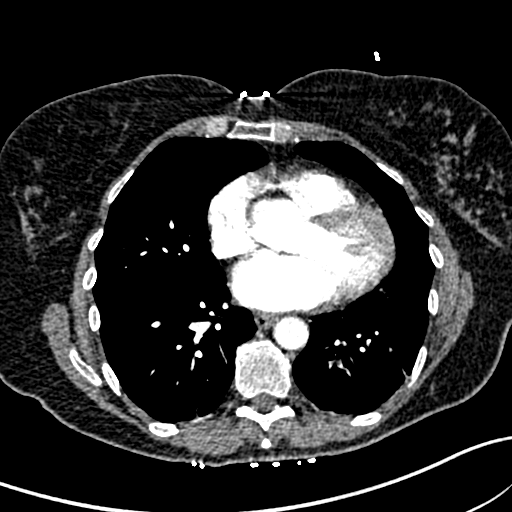
[im 100/257  lung]
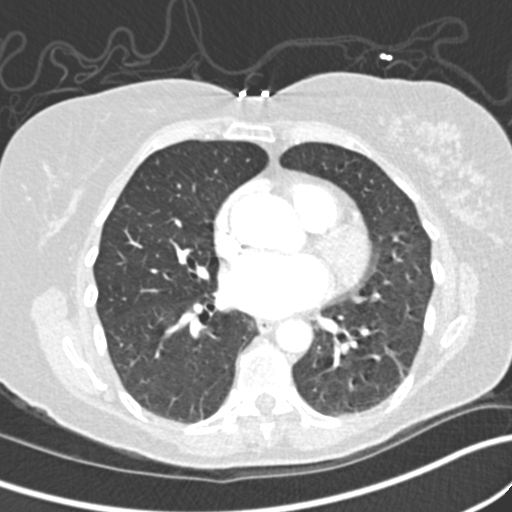
[im 114/257  mediastinal]
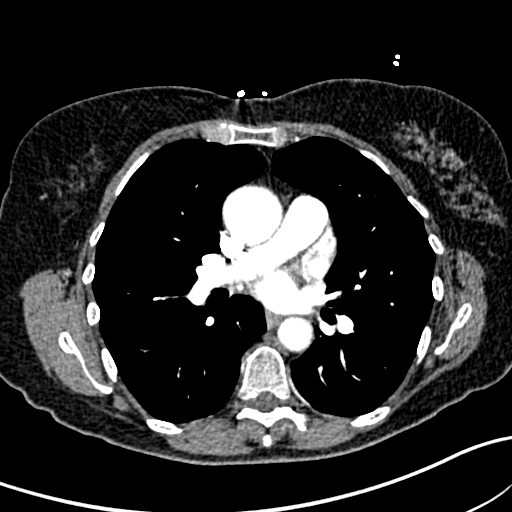
[im 129/257  lung]
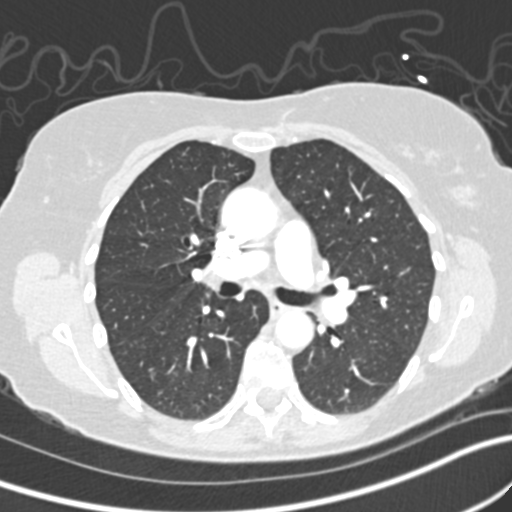
[im 143/257  mediastinal]
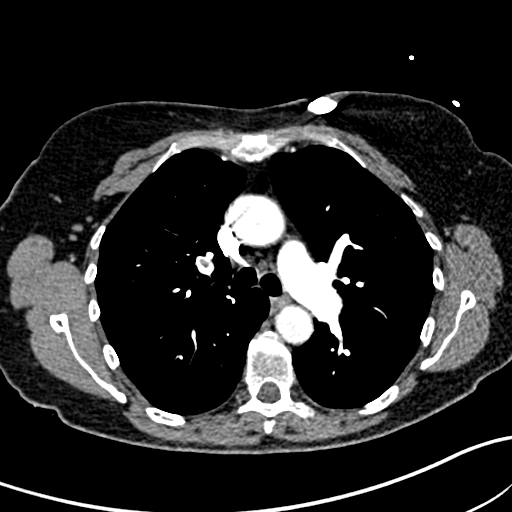
[im 157/257  lung]
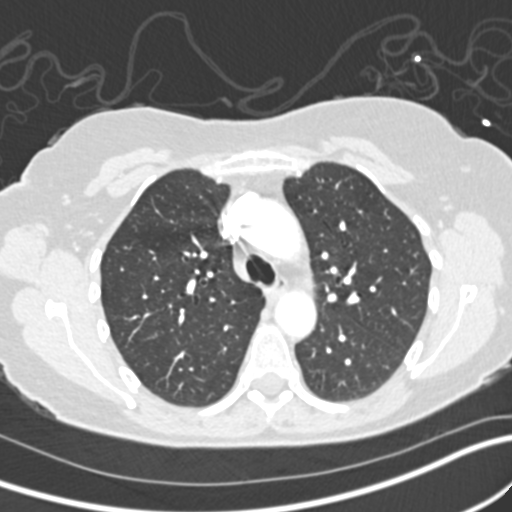
[im 171/257  mediastinal]
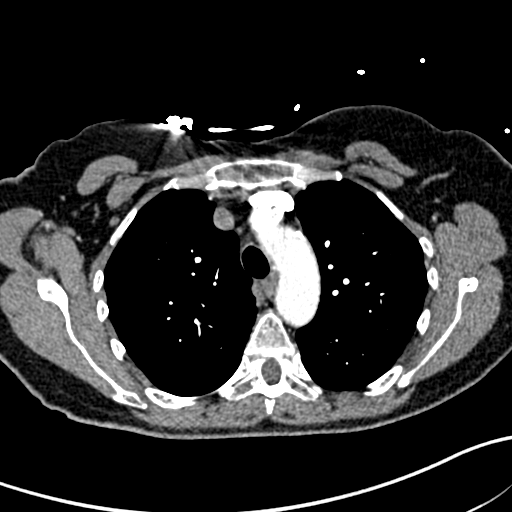
[im 185/257  lung]
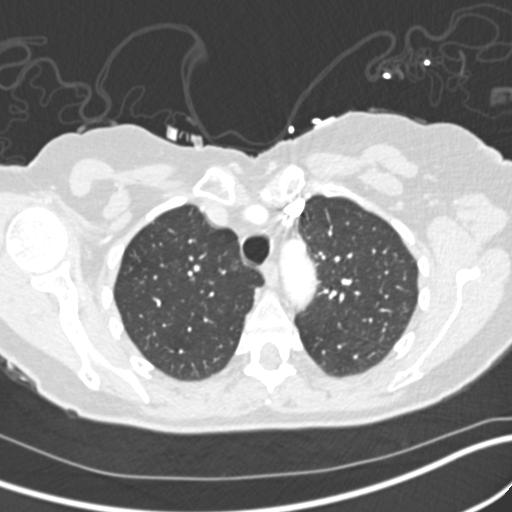
[im 200/257  mediastinal]
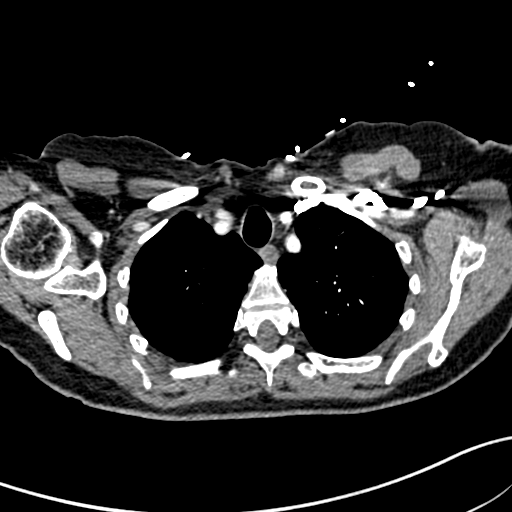
[im 214/257  lung]
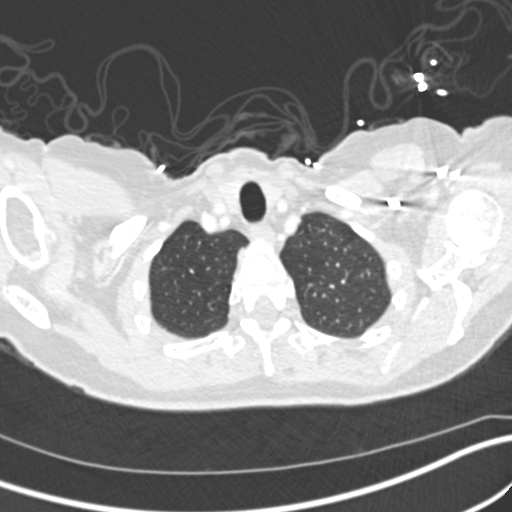
[im 228/257  mediastinal]
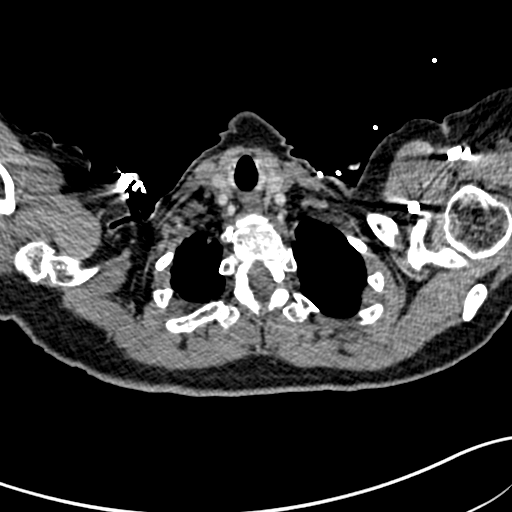
[im 242/257  lung]
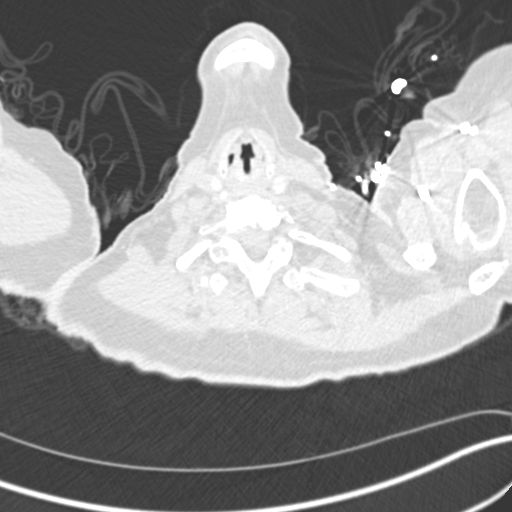

[Series 7: coronal mpr · coronal · 0.53mm/px · 1 of 120 slices shown]
[im 60/120  mediastinal]
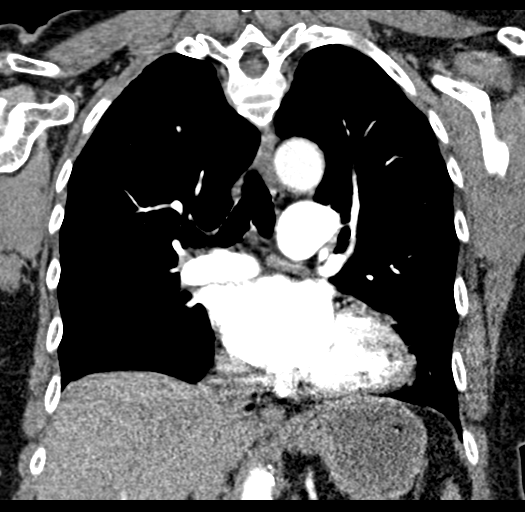

[18 of 36 positions shown; findings below may reference images not displayed]

FINDINGS: Cardiovascular: Satisfactory opacification of the pulmonary arteries
to the segmental level. Non occlusive subsegmental pulmonary
embolism in the posterior left lower lobe. Nonocclusive distal
lobar/proximal segmental pulmonary embolism in the right upper lobe.
Right to left ventricular ratio is less than 0.9. Normal heart size.
No pericardial effusion.

Mediastinum/Nodes: No enlarged mediastinal, hilar, or axillary lymph
nodes. Thyroid gland, trachea, and esophagus demonstrate no
significant findings.

Lungs/Pleura: Minimal bibasilar subsegmental atelectasis. No focal
consolidations. No pleural effusion or pneumothorax.

Upper Abdomen: The visualized upper abdomen is within normal limits.

Musculoskeletal: No chest wall abnormality. No acute or significant
osseous findings.

Review of the MIP images confirms the above findings.
IMPRESSION: Vascular:

Small burden of right upper lobar/segmental and left lower lobe
posterior segmental acute pulmonary emboli. No evidence of right
heart strain.

Non-Vascular:

Minimal bibasilar subsegmental atelectasis.

These results were called by telephone at the time of interpretation
on 06/25/2021 at [DATE] to provider FEDA MOHAMMAD EP , who verbally
acknowledged these results.

## 2022-09-03 ENCOUNTER — Encounter: Payer: Self-pay | Admitting: Family Medicine

## 2022-09-03 ENCOUNTER — Other Ambulatory Visit: Payer: Self-pay | Admitting: Family Medicine

## 2022-09-03 MED ORDER — PREDNISONE 10 MG (21) PO TBPK: ORAL_TABLET | ORAL | 0 refills | Status: DC

## 2022-09-03 MED ORDER — ATENOLOL 25 MG PO TABS: 100.0000 mg | ORAL_TABLET | Freq: Every day | ORAL | 3 refills | Status: DC

## 2022-09-11 NOTE — Progress Notes (Signed)
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 She continues Qulipta and atenolol (recently increased to '100mg'$ ). Nurtec and naratripan alternated for abortive therapy. She reports headaches are fairly stable. Rare migraines for first 8-9 weeks. She does have daily migraines for the last two weeks before next procedure. She uses abortive meds QOD for the last two weeks. Prednisone taper helps as well. Last taper 09/03/2022.   06/17/2022 ALL: Taina returns for Botox. We increased atenolol to '75mg'$  in 04/2022 after headaches worsened. She continues [place] daily. Nurtec and naratritpan works best for abortive therapy. Trudesha not covered.   03/20/2022 ALL: Leonard returns for Botox. We increased atenolol to '50mg'$  daily and continued Qulipta. She reports daily headaches are significantly better. She may have 1 headache per week. She may have 2 migraines on average per month. She uses either Nurtec, ubrelvy or naratriptan for abortive therapy.   12/19/2021 ALL: She returns for Botox. She was seen in follow up with Dr Jaynee Eagles via [place] 12/10/2021 and started on atenolol 12.'5mg'$  daily. She has failed multiple oral and injection preventatives. She continues [place]. She feels that she is doing a little better. She did have a migraine yesterday thought to be related to the weather. Dr Jaynee Eagles gave her Ubrelvy 16 tabs per month. She also has Nurtec and naratriptan which help with abortive therapy. She is aware not to take more than prescribed dose and to try to avoid regular use of abortive agents.   09/16/2021 ALL: She returns for Botox. She is having more frequent and intense migraines. Tummy tuck over the summer then DVTs. Hemoglobin dropped and had GI eval. More stress. About 4-5 migraines a month but some toward end of Emgality/Botox cycle are severe and intractable. Medrol dose pack did not help much last week. Nurtec and naratriptan do help some. Tried and failed topiramate, nortriptyline, propranolol, metoprolol,  Amovig, Emgality, Ajovy, gabapentin, sumatriptan, rizatriptan, Botox. We will try Qulipta '60mg'$  daily.  06/12/2021 ALL: She returns for Botox procedure. She had difficulty with an intractable migraines that lasted for over a week. It was finally aborted with migraine cocktail and steroid taper. She usually has about 1 migraine per month that is easily aborted with Nurtec. She is feeling well, today.   02/27/2021 ALL: She continues to do well on Botox. Greater than 50% reduction in migraines. Emgality and rizatriptan working well.   11/29/2020 ALL: She continues to do well. She continues Botox and Emgality. Rizatriptan works well for abortive therapy. Average 1 migraine per month.    Consent Form Botulism Toxin Injection For Chronic Migraine    Reviewed orally with patient, additionally signature is on file:  Botulism toxin has been approved by the Federal drug administration for treatment of chronic migraine. Botulism toxin does not cure chronic migraine and it may not be effective in some patients.  The administration of botulism toxin is accomplished by injecting a small amount of toxin into the muscles of the neck and head. Dosage must be titrated for each individual. Any benefits resulting from botulism toxin tend to wear off after 3 months with a repeat injection required if benefit is to be maintained. Injections are usually done every 3-4 months with maximum effect peak achieved by about 2 or 3 weeks. Botulism toxin is expensive and you should be sure of what costs you will incur resulting from the injection.  The side effects of botulism toxin use for chronic migraine may include:   -Transient, and usually mild, facial weakness with facial injections  -Transient,  and usually mild, head or neck weakness with head/neck injections  -Reduction or loss of forehead facial animation due to forehead muscle weakness  -Eyelid drooping  -Dry eye  -Pain at the site of injection or bruising at the  site of injection  -Double vision  -Potential unknown long term risks   Contraindications: You should not have Botox if you are pregnant, nursing, allergic to albumin, have an infection, skin condition, or muscle weakness at the site of the injection, or have myasthenia gravis, Lambert-Eaton syndrome, or ALS.  It is also possible that as with any injection, there may be an allergic reaction or no effect from the medication. Reduced effectiveness after repeated injections is sometimes seen and rarely infection at the injection site may occur. All care will be taken to prevent these side effects. If therapy is given over a long time, atrophy and wasting in the muscle injected may occur. Occasionally the patient's become refractory to treatment because they develop antibodies to the toxin. In this event, therapy needs to be modified.  I have read the above information and consent to the administration of botulism toxin.    BOTOX PROCEDURE NOTE FOR MIGRAINE HEADACHE  Contraindications and precautions discussed with patient(above). Aseptic procedure was observed and patient tolerated procedure. Procedure performed by Debbora Presto, FNP-C.   The condition has existed for more than 6 months, and pt does not have a diagnosis of ALS, Myasthenia Gravis or Lambert-Eaton Syndrome.  Risks and benefits of injections discussed and pt agrees to proceed with the procedure.  Written consent obtained  These injections are medically necessary. Pt  receives good benefits from these injections. These injections do not cause sedations or hallucinations which the oral therapies may cause.   Description of procedure:  The patient was placed in a sitting position. The standard protocol was used for Botox as follows, with 5 units of Botox injected at each site:  -Procerus muscle, midline injection  -Corrugator muscle, bilateral injection  -Frontalis muscle, bilateral injection, with 2 sites each side, medial  injection was performed in the upper one third of the frontalis muscle, in the region vertical from the medial inferior edge of the superior orbital rim. The lateral injection was again in the upper one third of the forehead vertically above the lateral limbus of the cornea, 1.5 cm lateral to the medial injection site.  -Temporalis muscle injection, 4 sites, bilaterally. The first injection was 3 cm above the tragus of the ear, second injection site was 1.5 cm to 3 cm up from the first injection site in line with the tragus of the ear. The third injection site was 1.5-3 cm forward between the first 2 injection sites. The fourth injection site was 1.5 cm posterior to the second injection site. 5th site laterally in the temporalis  muscleat the level of the outer canthus.  -Occipitalis muscle injection, 5 sites, bilaterally. The first injection was done one half way between the occipital protuberance and the tip of the mastoid process behind the ear. The second injection site was done lateral and superior to the first, 1 fingerbreadth from the first injection. The third injection site was 1 fingerbreadth superiorly and medially from the first injection site.  -Cervical paraspinal muscle injection, 2 sites, bilaterally. The first injection site was 1 cm from the midline of the cervical spine, 3 cm inferior to the lower border of the occipital protuberance. The second injection site was 1.5 cm superiorly and laterally to the first injection site.  -Trapezius  muscle injection was performed at 3 sites, bilaterally. The first injection site was in the upper trapezius muscle halfway between the inflection point of the neck, and the acromion. The second injection site was one half way between the acromion and the first injection site. The third injection was done between the first injection site and the inflection point of the neck.   Will return for repeat injection in 3 months.   A total of 200 units of Botox  was prepared, 200 units of Botox was injected as documented above, any Botox not injected was wasted. The patient tolerated the procedure well, there were no complications of the above procedure.

## 2022-09-15 ENCOUNTER — Encounter: Payer: Self-pay | Admitting: Family Medicine

## 2022-09-15 ENCOUNTER — Ambulatory Visit: Payer: Medicare Other | Admitting: Family Medicine

## 2022-09-15 VITALS — BP 117/45 | HR 66 | Ht 63.0 in | Wt 148.0 lb

## 2022-09-15 DIAGNOSIS — G43709 Chronic migraine without aura, not intractable, without status migrainosus: Secondary | ICD-10-CM

## 2022-09-15 DIAGNOSIS — G43009 Migraine without aura, not intractable, without status migrainosus: Secondary | ICD-10-CM

## 2022-09-15 MED ORDER — ONABOTULINUMTOXINA 200 UNITS IJ SOLR
155.0000 [IU] | Freq: Once | INTRAMUSCULAR | Status: AC
  Administered 2022-09-15: 200 [IU] via INTRAMUSCULAR

## 2022-09-15 MED ORDER — QULIPTA 60 MG PO TABS: 60.0000 mg | ORAL_TABLET | Freq: Every day | ORAL | 3 refills | Status: DC

## 2022-09-15 NOTE — Progress Notes (Signed)
Botox- 200 units x 1 vial Lot: [id number] Expiration: 01/21/2025 NDC: 3903-0092-33  Bacteriostatic 0.9% Sodium Chloride- 36m total Lot: [id number]: 10/16/2022 NDC: [address]-25 Dx: GW38.937  B/B

## 2022-09-23 DIAGNOSIS — L821 Other seborrheic keratosis: Secondary | ICD-10-CM | POA: Diagnosis not present

## 2022-09-23 DIAGNOSIS — I872 Venous insufficiency (chronic) (peripheral): Secondary | ICD-10-CM | POA: Diagnosis not present

## 2022-09-23 DIAGNOSIS — L738 Other specified follicular disorders: Secondary | ICD-10-CM | POA: Diagnosis not present

## 2022-09-23 DIAGNOSIS — L608 Other nail disorders: Secondary | ICD-10-CM | POA: Diagnosis not present

## 2022-09-23 DIAGNOSIS — Z411 Encounter for cosmetic surgery: Secondary | ICD-10-CM | POA: Diagnosis not present

## 2022-09-23 DIAGNOSIS — L570 Actinic keratosis: Secondary | ICD-10-CM | POA: Diagnosis not present

## 2022-09-23 DIAGNOSIS — D485 Neoplasm of uncertain behavior of skin: Secondary | ICD-10-CM | POA: Diagnosis not present

## 2022-09-23 DIAGNOSIS — L820 Inflamed seborrheic keratosis: Secondary | ICD-10-CM | POA: Diagnosis not present

## 2022-09-23 DIAGNOSIS — L578 Other skin changes due to chronic exposure to nonionizing radiation: Secondary | ICD-10-CM | POA: Diagnosis not present

## 2022-09-23 DIAGNOSIS — D225 Melanocytic nevi of trunk: Secondary | ICD-10-CM | POA: Diagnosis not present

## 2022-10-06 ENCOUNTER — Ambulatory Visit (HOSPITAL_COMMUNITY)
Admission: RE | Admit: 2022-10-06 | Discharge: 2022-10-06 | Disposition: A | Discharge status: Home / Self Care | Payer: Medicare Other | Source: Ambulatory Visit | Attending: Physician Assistant | Admitting: Physician Assistant

## 2022-10-06 ENCOUNTER — Encounter (HOSPITAL_COMMUNITY): Payer: Self-pay | Admitting: Physician Assistant

## 2022-10-06 VITALS — BP 132/72 | HR 63 | Ht 63.0 in | Wt 150.8 lb

## 2022-10-06 DIAGNOSIS — I4892 Unspecified atrial flutter: Secondary | ICD-10-CM | POA: Insufficient documentation

## 2022-10-06 DIAGNOSIS — I10 Essential (primary) hypertension: Secondary | ICD-10-CM | POA: Insufficient documentation

## 2022-10-06 DIAGNOSIS — Z79899 Other long term (current) drug therapy: Secondary | ICD-10-CM | POA: Diagnosis not present

## 2022-10-06 DIAGNOSIS — Z7901 Long term (current) use of anticoagulants: Secondary | ICD-10-CM | POA: Diagnosis not present

## 2022-10-06 DIAGNOSIS — Z86711 Personal history of pulmonary embolism: Secondary | ICD-10-CM | POA: Diagnosis not present

## 2022-10-06 DIAGNOSIS — I4819 Other persistent atrial fibrillation: Secondary | ICD-10-CM | POA: Diagnosis not present

## 2022-10-06 DIAGNOSIS — D6869 Other thrombophilia: Secondary | ICD-10-CM | POA: Insufficient documentation

## 2022-10-06 DIAGNOSIS — Z86718 Personal history of other venous thrombosis and embolism: Secondary | ICD-10-CM | POA: Diagnosis not present
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
## 2022-10-13 DIAGNOSIS — L299 Pruritus, unspecified: Secondary | ICD-10-CM | POA: Diagnosis not present

## 2022-10-13 DIAGNOSIS — R945 Abnormal results of liver function studies: Secondary | ICD-10-CM | POA: Diagnosis not present

## 2022-10-14 DIAGNOSIS — H53143 Visual discomfort, bilateral: Secondary | ICD-10-CM | POA: Diagnosis not present

## 2022-11-05 ENCOUNTER — Encounter: Payer: Self-pay | Admitting: Family Medicine

## 2022-11-05 DIAGNOSIS — R070 Pain in throat: Secondary | ICD-10-CM | POA: Diagnosis not present

## 2022-11-05 DIAGNOSIS — J312 Chronic pharyngitis: Secondary | ICD-10-CM | POA: Diagnosis not present

## 2022-11-28 ENCOUNTER — Telehealth: Payer: Self-pay | Admitting: *Deleted

## 2022-11-28 NOTE — Telephone Encounter (Signed)
"  Request Reference Number: [id number]. NURTEC TAB '75MG'$  ODT is approved through 11/24/2023. Your patient may now fill this prescription and it will be covered."

## 2022-11-28 NOTE — Telephone Encounter (Signed)
Submitted PA Nurtec on covermymeds. Key: [id number]. Waiting on determination from [employer] Medicare Part D.

## 2022-12-02 DIAGNOSIS — R9389 Abnormal findings on diagnostic imaging of other specified body structures: Secondary | ICD-10-CM | POA: Diagnosis not present

## 2022-12-02 DIAGNOSIS — Z1211 Encounter for screening for malignant neoplasm of colon: Secondary | ICD-10-CM | POA: Diagnosis not present

## 2022-12-02 DIAGNOSIS — D649 Anemia, unspecified: Secondary | ICD-10-CM | POA: Diagnosis not present

## 2022-12-03 ENCOUNTER — Telehealth: Payer: Self-pay | Admitting: Family Medicine

## 2022-12-03 ENCOUNTER — Other Ambulatory Visit (HOSPITAL_COMMUNITY): Payer: Self-pay

## 2022-12-03 NOTE — Telephone Encounter (Signed)
Pt has called back to provide: [facility] Plus Member [place]#[id number] [phone]) [id number] Rx [phone] Rx PCN#[id number] Rx [phone]

## 2022-12-03 NOTE — Telephone Encounter (Signed)
Pharmacy Patient Advocate Encounter   Received notification from [facility] that prior authorization for Botox 200UNIT solution is required/requested.    PA submitted on 12/03/2022 to (ins) [employer] via CoverMyMeds Key [id number] Status is pending

## 2022-12-03 NOTE — Telephone Encounter (Signed)
Called pt. Left vm message to please call the office.

## 2022-12-03 NOTE — Telephone Encounter (Signed)
Please call patient this morning and see if her insurance has changed at all for 2024. If so, we need the new information.

## 2022-12-03 NOTE — Telephone Encounter (Signed)
Looks like pt has medicare A&B in chart from previous and she also presented [employer] gold information today. Please do PA asap.   Chronic Migraine CPT 64615  Botox J0585 Units: 200  G43.709 Chronic Migraine without aura, not intractable, without status migrainous  Need PA asap. Appt on 12/09/22

## 2022-12-03 NOTE — Telephone Encounter (Signed)
Patient is scheduled for 1/16 and her PA expires on 1/17. Please start a new Botox PA thanks!

## 2022-12-04 NOTE — Progress Notes (Signed)
12/09/2022 ALL: Christina [name] returns for Botox. She continues Qulipta '60mg'$  daily and atenolol '100mg'$  daily. Nurtec and naratriptan used for abortive therapy. She reports headaches were worse for about two weeks after last visit but since have been well managed. She has not had breakthrough headaches like normal.   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 She continues Qulipta and atenolol (recently increased to '100mg'$ ). Nurtec and naratripan alternated for abortive therapy. She reports headaches are fairly stable. Rare migraines for first 8-9 weeks. She does have daily migraines for the last two weeks before next procedure. She uses abortive meds QOD for the last two weeks. Prednisone taper helps as well. Last taper 09/03/2022.   06/17/2022 ALL: Christina [name] returns for Botox. We increased atenolol to '75mg'$  in 04/2022 after headaches worsened. She continues [place] daily. Nurtec and naratritpan works best for abortive therapy. Trudesha not covered.   03/20/2022 ALL: Christina [name] returns for Botox. We increased atenolol to '50mg'$  daily and continued Qulipta. She reports daily headaches are significantly better. She may have 1 headache per week. She may have 2 migraines on average per month. She uses either Nurtec, ubrelvy or naratriptan for abortive therapy.   12/19/2021 ALL: She returns for Botox. She was seen in follow up with Dr Jaynee Eagles via [place] 12/10/2021 and started on atenolol 12.'5mg'$  daily. She has failed multiple oral and injection preventatives. She continues [place]. She feels that she is doing a little better. She did have a migraine yesterday thought to be related to the weather. Dr Jaynee Eagles gave her Ubrelvy 16 tabs per month. She also has Nurtec and naratriptan which help with abortive therapy. She is aware not to take more than prescribed dose and to try to avoid regular use of abortive agents.   09/16/2021 ALL: She returns for Botox. She is having more frequent and intense migraines. Tummy tuck over the summer then  DVTs. Hemoglobin dropped and had GI eval. More stress. About 4-5 migraines a month but some toward end of Emgality/Botox cycle are severe and intractable. Medrol dose pack did not help much last week. Nurtec and naratriptan do help some. Tried and failed topiramate, nortriptyline, propranolol, metoprolol, Amovig, Emgality, Ajovy, gabapentin, sumatriptan, rizatriptan, Botox. We will try Qulipta '60mg'$  daily.  06/12/2021 ALL: She returns for Botox procedure. She had difficulty with an intractable migraines that lasted for over a week. It was finally aborted with migraine cocktail and steroid taper. She usually has about 1 migraine per month that is easily aborted with Nurtec. She is feeling well, today.   02/27/2021 ALL: She continues to do well on Botox. Greater than 50% reduction in migraines. Emgality and rizatriptan working well.   11/29/2020 ALL: She continues to do well. She continues Botox and Emgality. Rizatriptan works well for abortive therapy. Average 1 migraine per month.    Consent Form Botulism Toxin Injection For Chronic Migraine    Reviewed orally with patient, additionally signature is on file:  Botulism toxin has been approved by the Federal drug administration for treatment of chronic migraine. Botulism toxin does not cure chronic migraine and it may not be effective in some patients.  The administration of botulism toxin is accomplished by injecting a small amount of toxin into the muscles of the neck and head. Dosage must be titrated for each individual. Any benefits resulting from botulism toxin tend to wear off after 3 months with a repeat injection required if benefit is to be maintained. Injections are usually done every 3-4 months with maximum effect peak achieved by  about 2 or 3 weeks. Botulism toxin is expensive and you should be sure of what costs you will incur resulting from the injection.  The side effects of botulism toxin use for chronic migraine may  include:   -Transient, and usually mild, facial weakness with facial injections  -Transient, and usually mild, head or neck weakness with head/neck injections  -Reduction or loss of forehead facial animation due to forehead muscle weakness  -Eyelid drooping  -Dry eye  -Pain at the site of injection or bruising at the site of injection  -Double vision  -Potential unknown long term risks   Contraindications: You should not have Botox if you are pregnant, nursing, allergic to albumin, have an infection, skin condition, or muscle weakness at the site of the injection, or have myasthenia gravis, Lambert-Eaton syndrome, or ALS.  It is also possible that as with any injection, there may be an allergic reaction or no effect from the medication. Reduced effectiveness after repeated injections is sometimes seen and rarely infection at the injection site may occur. All care will be taken to prevent these side effects. If therapy is given over a long time, atrophy and wasting in the muscle injected may occur. Occasionally the patient's become refractory to treatment because they develop antibodies to the toxin. In this event, therapy needs to be modified.  I have read the above information and consent to the administration of botulism toxin.    BOTOX PROCEDURE NOTE FOR MIGRAINE HEADACHE  Contraindications and precautions discussed with patient(above). Aseptic procedure was observed and patient tolerated procedure. Procedure performed by Debbora Presto, FNP-C.   The condition has existed for more than 6 months, and pt does not have a diagnosis of ALS, Myasthenia Gravis or Lambert-Eaton Syndrome.  Risks and benefits of injections discussed and pt agrees to proceed with the procedure.  Written consent obtained  These injections are medically necessary. Pt  receives good benefits from these injections. These injections do not cause sedations or hallucinations which the oral therapies may cause.   Description  of procedure:  The patient was placed in a sitting position. The standard protocol was used for Botox as follows, with 5 units of Botox injected at each site:  -Procerus muscle, midline injection  -Corrugator muscle, bilateral injection  -Frontalis muscle, bilateral injection, with 2 sites each side, medial injection was performed in the upper one third of the frontalis muscle, in the region vertical from the medial inferior edge of the superior orbital rim. The lateral injection was again in the upper one third of the forehead vertically above the lateral limbus of the cornea, 1.5 cm lateral to the medial injection site.  -Temporalis muscle injection, 4 sites, bilaterally. The first injection was 3 cm above the tragus of the ear, second injection site was 1.5 cm to 3 cm up from the first injection site in line with the tragus of the ear. The third injection site was 1.5-3 cm forward between the first 2 injection sites. The fourth injection site was 1.5 cm posterior to the second injection site. 5th site laterally in the temporalis  muscleat the level of the outer canthus.  -Occipitalis muscle injection, 5 sites, bilaterally. The first injection was done one half way between the occipital protuberance and the tip of the mastoid process behind the ear. The second injection site was done lateral and superior to the first, 1 fingerbreadth from the first injection. The third injection site was 1 fingerbreadth superiorly and medially from the first injection site.  -  Cervical paraspinal muscle injection, 2 sites, bilaterally. The first injection site was 1 cm from the midline of the cervical spine, 3 cm inferior to the lower border of the occipital protuberance. The second injection site was 1.5 cm superiorly and laterally to the first injection site.  -Trapezius muscle injection was performed at 3 sites, bilaterally. The first injection site was in the upper trapezius muscle halfway between the inflection  point of the neck, and the acromion. The second injection site was one half way between the acromion and the first injection site. The third injection was done between the first injection site and the inflection point of the neck.   Will return for repeat injection in 3 months.   A total of 200 units of Botox was prepared, 155 units of Botox was injected as documented above, 45 units of Botox was wasted. The patient tolerated the procedure well, there were no complications of the above procedure.

## 2022-12-05 ENCOUNTER — Other Ambulatory Visit (HOSPITAL_COMMUNITY): Payer: Self-pay

## 2022-12-05 NOTE — Telephone Encounter (Signed)
Pharmacy Patient Advocate Encounter  Prior Authorization for Botox 200UNIT solution has been approved.    PA# PA Case ID: [id number] Effective dates: 12/03/2022 through 11/24/2023  This is [employer] and [employer].

## 2022-12-05 NOTE — Telephone Encounter (Signed)
BotoxOne-Benefit Verification [id number] Submitted!

## 2022-12-08 NOTE — Telephone Encounter (Signed)
Noted. Appt note has been updated.

## 2022-12-09 ENCOUNTER — Ambulatory Visit: Payer: Medicare HMO | Admitting: Family Medicine

## 2022-12-09 DIAGNOSIS — G43709 Chronic migraine without aura, not intractable, without status migrainosus: Secondary | ICD-10-CM

## 2022-12-09 MED ORDER — ONABOTULINUMTOXINA 200 UNITS IJ SOLR
155.0000 [IU] | Freq: Once | INTRAMUSCULAR | Status: AC
  Administered 2022-12-09: 155 [IU] via INTRAMUSCULAR

## 2022-12-09 NOTE — Progress Notes (Signed)
Botox- 200 units x 1 vial Lot: [id number] Expiration: 04/2025 NDC: 8412-8208-13  Bacteriostatic 0.9% Sodium Chloride- 34m total Lot: [id number]: 11/25 NDC: [address]-15 Dx: GA68.257B/B

## 2022-12-11 DIAGNOSIS — Z01419 Encounter for gynecological examination (general) (routine) without abnormal findings: Secondary | ICD-10-CM | POA: Diagnosis not present

## 2022-12-11 DIAGNOSIS — Z1231 Encounter for screening mammogram for malignant neoplasm of breast: Secondary | ICD-10-CM | POA: Diagnosis not present

## 2022-12-24 ENCOUNTER — Encounter: Payer: Self-pay | Admitting: Allergy

## 2022-12-24 ENCOUNTER — Other Ambulatory Visit: Payer: Self-pay

## 2022-12-24 ENCOUNTER — Ambulatory Visit: Payer: Medicare HMO | Admitting: Allergy

## 2022-12-24 VITALS — BP 110/80 | HR 70 | Temp 98.7°F | Resp 18 | Ht 63.0 in | Wt 149.1 lb

## 2022-12-24 DIAGNOSIS — Z889 Allergy status to unspecified drugs, medicaments and biological substances status: Secondary | ICD-10-CM | POA: Diagnosis not present

## 2022-12-24 DIAGNOSIS — T50905D Adverse effect of unspecified drugs, medicaments and biological substances, subsequent encounter: Secondary | ICD-10-CM

## 2022-12-24 DIAGNOSIS — L299 Pruritus, unspecified: Secondary | ICD-10-CM
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
## 2022-12-24 NOTE — Patient Instructions (Addendum)
Itching with Penicillin - Penicillin IgE testing is negative - Penicillin graded oral challenge is successfully passed - You do not have IgE mediated drug allergy to Penicillin which means that you should not have anaphylaxis or life-threatening symptoms with Penicillin use - Rash and itch can be a side effect of Penicillin and this is likely what is happening - You can treat the itch when it occurs to continue on with the medication regimen OR take a different medication agent to treat the actinomyces if there is an appropriate alternative - If electing to manage itch you can take antihistamine regimen of H1 blocker like Zyrtec, Xyzal or Allegra daily WITH H2 blocker Pepcid daily.   If needed can take both medications twice a day for improved control  Follow-up as needed

## 2023-01-01 ENCOUNTER — Encounter (HOSPITAL_COMMUNITY): Payer: Self-pay | Admitting: *Deleted

## 2023-01-09 ENCOUNTER — Encounter: Payer: Self-pay | Admitting: Family Medicine

## 2023-01-12 MED ORDER — QULIPTA 60 MG PO TABS: 60.0000 mg | ORAL_TABLET | Freq: Every day | ORAL | 3 refills | Status: DC

## 2023-01-21 ENCOUNTER — Other Ambulatory Visit: Payer: Self-pay | Admitting: Interventional Radiology

## 2023-01-21 DIAGNOSIS — I871 Compression of vein: Secondary | ICD-10-CM

## 2023-02-02 ENCOUNTER — Encounter: Payer: Self-pay | Admitting: Family Medicine

## 2023-02-03 ENCOUNTER — Other Ambulatory Visit (HOSPITAL_COMMUNITY): Payer: Self-pay

## 2023-02-03 ENCOUNTER — Telehealth: Payer: Self-pay | Admitting: *Deleted

## 2023-02-03 NOTE — Telephone Encounter (Signed)
Pt said that Qulipta 60 mg tablets needs PA with her [employer].   Please route replies to pod 1.

## 2023-02-03 NOTE — Telephone Encounter (Signed)
Patient Advocate Encounter   Received notification that prior authorization for Qulipta '60MG'$  tablets is required.   PA submitted on 02/03/2023 Key [id number]  Insurance [employer] Medicare Part D Electronic Prior Authorization Form Status is pending       Lyndel Safe, CPhT Pharmacy Patient Advocate Specialist [place] Patient Advocate Team Direct Number: [phone]  Fax: [phone]

## 2023-02-04 ENCOUNTER — Other Ambulatory Visit (HOSPITAL_COMMUNITY): Payer: Self-pay

## 2023-02-04 NOTE — Telephone Encounter (Signed)
Patient informed via my chart.

## 2023-02-04 NOTE — Telephone Encounter (Signed)
Pharmacy Patient Advocate Encounter  Prior Authorization for Qulipta '60MG'$  Tablets has been approved by [employer] (ins).    PA # PA Case ID #: [id number] Effective dates: 02/03/2023 through 11/24/2023

## 2023-02-06 ENCOUNTER — Encounter (HOSPITAL_COMMUNITY): Payer: Self-pay

## 2023-02-06 ENCOUNTER — Ambulatory Visit (HOSPITAL_COMMUNITY)
Admission: RE | Admit: 2023-02-06 | Discharge: 2023-02-06 | Disposition: A | Discharge status: Home / Self Care | Payer: Medicare Other | Source: Ambulatory Visit | Attending: Interventional Radiology | Admitting: Interventional Radiology

## 2023-02-06 DIAGNOSIS — N3289 Other specified disorders of bladder: Secondary | ICD-10-CM | POA: Diagnosis not present

## 2023-02-06 DIAGNOSIS — I871 Compression of vein: Secondary | ICD-10-CM | POA: Insufficient documentation

## 2023-02-06 MED ORDER — SODIUM CHLORIDE (PF) 0.9 % IJ SOLN
INTRAMUSCULAR | Status: AC
  Filled 2023-02-06: qty 50

## 2023-02-06 MED ORDER — IOHEXOL 350 MG/ML SOLN
100.0000 mL | Freq: Once | INTRAVENOUS | Status: AC | PRN
  Administered 2023-02-06: 100 mL via INTRAVENOUS

## 2023-02-12 ENCOUNTER — Telehealth: Payer: Self-pay | Admitting: Family Medicine

## 2023-02-12 NOTE — Telephone Encounter (Signed)
Pt has new [facility] insurance as of 12/2022. Please complete auth for Botox before 03/05/23 appt, thank you!

## 2023-02-13 ENCOUNTER — Ambulatory Visit
Admission: RE | Admit: 2023-02-13 | Discharge: 2023-02-13 | Disposition: A | Discharge status: Home / Self Care | Payer: Medicare Other | Source: Ambulatory Visit | Attending: Interventional Radiology | Admitting: Interventional Radiology

## 2023-02-13 DIAGNOSIS — I871 Compression of vein: Secondary | ICD-10-CM

## 2023-02-13 HISTORY — PX: IR RADIOLOGIST EVAL & MGMT: IMG5224

## 2023-02-13 NOTE — Telephone Encounter (Signed)
   Can you verify the number of headaches the PT currently experiences-per chart visit notes there is no documentation supporting the number of headaches or a percentage of improvement since starting Botox.
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
## 2023-02-17 NOTE — Telephone Encounter (Signed)
PA submitted - awaiting decision

## 2023-02-19 NOTE — Telephone Encounter (Signed)
    This will be Buy and Bill 

## 2023-02-24 NOTE — Progress Notes (Signed)
12/09/2022 ALL: Margarit returns for Botox. She continues Qulipta  and atenolol  daily. Nurtec and naratriptan alternated for abortive therapy. She is doing well. She averages 2-3 headache days a month with 1-2 migrainous headaches.   12/09/2022 ALL: Dearra returns for Botox. She continues Qulipta  daily and atenolol  daily. Nurtec and naratriptan used for abortive therapy. She reports headaches were worse for about two weeks after last visit but since have been well managed. She has not had breakthrough headaches like normal.   09/15/2022 ALL: Quinetta returns for Botox. She continues Qulipta and atenolol (recently increased to ). Nurtec and naratripan alternated for abortive therapy. She reports headaches are fairly stable. Rare migraines for first 8-9 weeks. She does have daily migraines for the last two weeks before next procedure. She uses abortive meds QOD for the last two weeks. Prednisone taper helps as well. Last taper 09/03/2022.   06/17/2022 ALL: Oreta returns for Botox. We increased atenolol to  in 04/2022 after headaches worsened. She continues [place] daily. Nurtec and naratritpan works best for abortive therapy. Trudesha not covered.   03/20/2022 ALL: Darika returns for Botox. We increased atenolol to  daily and continued Qulipta. She reports daily headaches are significantly better. She may have 1 headache per week. She may have 2 migraines on average per month. She uses either Nurtec, ubrelvy or naratriptan for abortive therapy.   12/19/2021 ALL: She returns for Botox. She was seen in follow up with Dr Lucia Gaskins via Mychart 12/10/2021 and started on atenolol 12.5mg  daily. She has failed multiple oral and injection preventatives. She continues [place]. She feels that she is doing a little better. She did have a migraine yesterday thought to be related to the weather. Dr Lucia Gaskins gave her Ubrelvy 16 tabs per month. She also has Nurtec and naratriptan which help with  abortive therapy. She is aware not to take more than prescribed dose and to try to avoid regular use of abortive agents.   09/16/2021 ALL: She returns for Botox. She is having more frequent and intense migraines. Tummy tuck over the summer then DVTs. Hemoglobin dropped and had GI eval. More stress. About 4-5 migraines a month but some toward end of Emgality/Botox cycle are severe and intractable. Medrol dose pack did not help much last week. Nurtec and naratriptan do help some. Tried and failed topiramate, nortriptyline, propranolol, metoprolol, Amovig, Emgality, Ajovy, gabapentin, sumatriptan, rizatriptan, Botox. We will try Qulipta  daily.  06/12/2021 ALL: She returns for Botox procedure. She had difficulty with an intractable migraines that lasted for over a week. It was finally aborted with migraine cocktail and steroid taper. She usually has about 1 migraine per month that is easily aborted with Nurtec. She is feeling well, today.   02/27/2021 ALL: She continues to do well on Botox. Greater than 50% reduction in migraines. Emgality and rizatriptan working well.   11/29/2020 ALL: She continues to do well. She continues Botox and Emgality. Rizatriptan works well for abortive therapy. Average 1 migraine per month.    Consent Form Botulism Toxin Injection For Chronic Migraine    Reviewed orally with patient, additionally signature is on file:  Botulism toxin has been approved by the Federal drug administration for treatment of chronic migraine. Botulism toxin does not cure chronic migraine and it may not be effective in some patients.  The administration of botulism toxin is accomplished by injecting a small amount of toxin into the muscles of the neck and head. Dosage must be titrated for each  individual. Any benefits resulting from botulism toxin tend to wear off after 3 months with a repeat injection required if benefit is to be maintained. Injections are usually done every 3-4 months with  maximum effect peak achieved by about 2 or 3 weeks. Botulism toxin is expensive and you should be sure of what costs you will incur resulting from the injection.  The side effects of botulism toxin use for chronic migraine may include:   -Transient, and usually mild, facial weakness with facial injections  -Transient, and usually mild, head or neck weakness with head/neck injections  -Reduction or loss of forehead facial animation due to forehead muscle weakness  -Eyelid drooping  -Dry eye  -Pain at the site of injection or bruising at the site of injection  -Double vision  -Potential unknown long term risks   Contraindications: You should not have Botox if you are pregnant, nursing, allergic to albumin, have an infection, skin condition, or muscle weakness at the site of the injection, or have myasthenia gravis, Lambert-Eaton syndrome, or ALS.  It is also possible that as with any injection, there may be an allergic reaction or no effect from the medication. Reduced effectiveness after repeated injections is sometimes seen and rarely infection at the injection site may occur. All care will be taken to prevent these side effects. If therapy is given over a long time, atrophy and wasting in the muscle injected may occur. Occasionally the patient's become refractory to treatment because they develop antibodies to the toxin. In this event, therapy needs to be modified.  I have read the above information and consent to the administration of botulism toxin.    BOTOX PROCEDURE NOTE FOR MIGRAINE HEADACHE  Contraindications and precautions discussed with patient(above). Aseptic procedure was observed and patient tolerated procedure. Procedure performed by Shawnie Dapper, FNP-C.   The condition has existed for more than 6 months, and pt does not have a diagnosis of ALS, Myasthenia Gravis or Lambert-Eaton Syndrome.  Risks and benefits of injections discussed and pt agrees to proceed with the procedure.   Written consent obtained  These injections are medically necessary. Pt  receives good benefits from these injections. These injections do not cause sedations or hallucinations which the oral therapies may cause.   Description of procedure:  The patient was placed in a sitting position. The standard protocol was used for Botox as follows, with 5 units of Botox injected at each site:  -Procerus muscle, midline injection  -Corrugator muscle, bilateral injection  -Frontalis muscle, bilateral injection, with 2 sites each side, medial injection was performed in the upper one third of the frontalis muscle, in the region vertical from the medial inferior edge of the superior orbital rim. The lateral injection was again in the upper one third of the forehead vertically above the lateral limbus of the cornea, 1.5 cm lateral to the medial injection site.  -Temporalis muscle injection, 4 sites, bilaterally. The first injection was 3 cm above the tragus of the ear, second injection site was 1.5 cm to 3 cm up from the first injection site in line with the tragus of the ear. The third injection site was 1.5-3 cm forward between the first 2 injection sites. The fourth injection site was 1.5 cm posterior to the second injection site. 5th site laterally in the temporalis  muscleat the level of the outer canthus.  -Occipitalis muscle injection, 5 sites, bilaterally. The first injection was done one half way between the occipital protuberance and the tip of the  mastoid process behind the ear. The second injection site was done lateral and superior to the first, 1 fingerbreadth from the first injection. The third injection site was 1 fingerbreadth superiorly and medially from the first injection site.  -Cervical paraspinal muscle injection, 2 sites, bilaterally. The first injection site was 1 cm from the midline of the cervical spine, 3 cm inferior to the lower border of the occipital protuberance. The second  injection site was 1.5 cm superiorly and laterally to the first injection site.  -Trapezius muscle injection was performed at 3 sites, bilaterally. The first injection site was in the upper trapezius muscle halfway between the inflection point of the neck, and the acromion. The second injection site was one half way between the acromion and the first injection site. The third injection was done between the first injection site and the inflection point of the neck.   Will return for repeat injection in 3 months.   A total of 200 units of Botox was prepared, 155 units of Botox was injected as documented above, 45 units of Botox was wasted. The patient tolerated the procedure well, there were no complications of the above procedure.

## 2023-03-03 ENCOUNTER — Ambulatory Visit: Payer: Medicare Other | Admitting: Family Medicine

## 2023-03-03 VITALS — Ht 63.0 in

## 2023-03-03 DIAGNOSIS — G43709 Chronic migraine without aura, not intractable, without status migrainosus: Secondary | ICD-10-CM | POA: Diagnosis not present

## 2023-03-03 DIAGNOSIS — G43009 Migraine without aura, not intractable, without status migrainosus: Secondary | ICD-10-CM

## 2023-03-03 MED ORDER — ONABOTULINUMTOXINA 200 UNITS IJ SOLR
155.0000 [IU] | Freq: Once | INTRAMUSCULAR | Status: AC
  Administered 2023-03-03: 155 [IU] via INTRAMUSCULAR

## 2023-03-03 MED ORDER — NURTEC 75 MG PO TBDP: 75.0000 mg | ORAL_TABLET | Freq: Every day | ORAL | 11 refills | Status: DC | PRN

## 2023-03-03 MED ORDER — NARATRIPTAN HCL 2.5 MG PO TABS: 2.5000 mg | ORAL_TABLET | ORAL | 11 refills | Status: DC | PRN

## 2023-03-03 NOTE — Progress Notes (Signed)
Botox-200U x 1vial Lot: [id number] Expiration: 04/2025 NDC: 9169-4503-88  Bacteriostatic 0.9% Sodium Chloride- 70mL total EKC:[id number] Expiration:09/2024 NDC: 91505-697-94  Witnessed by Ted Mcalpine, RMA Buy/bill

## 2023-03-05 ENCOUNTER — Ambulatory Visit: Payer: Medicare HMO | Admitting: Family Medicine

## 2023-03-11 DIAGNOSIS — J351 Hypertrophy of tonsils: Secondary | ICD-10-CM | POA: Diagnosis not present

## 2023-04-06 ENCOUNTER — Ambulatory Visit (HOSPITAL_COMMUNITY)
Admission: RE | Admit: 2023-04-06 | Discharge: 2023-04-06 | Disposition: A | Discharge status: Home / Self Care | Payer: Medicare Other | Source: Ambulatory Visit | Attending: Physician Assistant | Admitting: Physician Assistant

## 2023-04-06 VITALS — BP 116/76 | HR 56 | Ht 63.0 in | Wt 150.6 lb

## 2023-04-06 DIAGNOSIS — Z7901 Long term (current) use of anticoagulants: Secondary | ICD-10-CM | POA: Insufficient documentation

## 2023-04-06 DIAGNOSIS — Z79899 Other long term (current) drug therapy: Secondary | ICD-10-CM | POA: Diagnosis not present

## 2023-04-06 DIAGNOSIS — Z8249 Family history of ischemic heart disease and other diseases of the circulatory system: Secondary | ICD-10-CM | POA: Diagnosis not present

## 2023-04-06 DIAGNOSIS — I10 Essential (primary) hypertension: Secondary | ICD-10-CM | POA: Diagnosis not present

## 2023-04-06 DIAGNOSIS — Z86711 Personal history of pulmonary embolism: Secondary | ICD-10-CM | POA: Diagnosis not present

## 2023-04-06 DIAGNOSIS — I4892 Unspecified atrial flutter: Secondary | ICD-10-CM | POA: Diagnosis present

## 2023-04-06 DIAGNOSIS — D6869 Other thrombophilia: Secondary | ICD-10-CM | POA: Insufficient documentation

## 2023-04-06 DIAGNOSIS — Z86718 Personal history of other venous thrombosis and embolism: Secondary | ICD-10-CM | POA: Diagnosis not present

## 2023-04-06 DIAGNOSIS — I4819 Other persistent atrial fibrillation: Secondary | ICD-10-CM | POA: Insufficient documentation
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
## 2023-05-08 ENCOUNTER — Encounter: Payer: Self-pay | Admitting: Family Medicine

## 2023-05-11 ENCOUNTER — Telehealth: Payer: Self-pay

## 2023-05-11 NOTE — Telephone Encounter (Signed)
Called pharmacy and asked them if they had pt's script for her medications because pt informed us that she had called the pharmacy and they told her that her script was expired. Pharmacist stated that they have pt's script and that nobody had told the pt that her script was expired. Pharmacist stated that they will fill them for her.

## 2023-05-25 DIAGNOSIS — E7800 Pure hypercholesterolemia, unspecified: Secondary | ICD-10-CM | POA: Diagnosis not present

## 2023-05-25 DIAGNOSIS — G43909 Migraine, unspecified, not intractable, without status migrainosus: Secondary | ICD-10-CM | POA: Diagnosis not present

## 2023-05-25 DIAGNOSIS — I10 Essential (primary) hypertension: Secondary | ICD-10-CM | POA: Diagnosis not present

## 2023-05-25 DIAGNOSIS — M810 Age-related osteoporosis without current pathological fracture: Secondary | ICD-10-CM | POA: Diagnosis not present

## 2023-05-25 DIAGNOSIS — Z0000 Encounter for general adult medical examination without abnormal findings: Secondary | ICD-10-CM | POA: Diagnosis not present

## 2023-05-25 DIAGNOSIS — D6869 Other thrombophilia: Secondary | ICD-10-CM | POA: Diagnosis not present

## 2023-05-25 DIAGNOSIS — K2960 Other gastritis without bleeding: Secondary | ICD-10-CM | POA: Diagnosis not present

## 2023-05-25 DIAGNOSIS — Z23 Encounter for immunization: Secondary | ICD-10-CM | POA: Diagnosis not present

## 2023-05-25 DIAGNOSIS — R110 Nausea: Secondary | ICD-10-CM | POA: Diagnosis not present

## 2023-05-25 DIAGNOSIS — I77810 Thoracic aortic ectasia: Secondary | ICD-10-CM | POA: Diagnosis not present

## 2023-05-25 DIAGNOSIS — I480 Paroxysmal atrial fibrillation: Secondary | ICD-10-CM | POA: Diagnosis not present

## 2023-06-01 NOTE — Progress Notes (Unsigned)
06/02/2023 ALL: Christina [name] returns for Botox. She continues to do well. She continues Qulipta 60mg  and atenolol 100mg  daily. Nurtec and naratriptan alternated for abortive therapy. She is doing well. She averages 2-3 headache days a month with 1-2 migrainous headaches.   03/03/2023 ALL: Christina [name] returns for Botox. She continues Qulipta 60mg  and atenolol 100mg  daily. Nurtec and naratriptan alternated for abortive therapy. She is doing well. She averages 2-3 headache days a month with 1-2 migrainous headaches.   12/09/2022 ALL: Christina [name] returns for Botox. She continues Qulipta 60mg  daily and atenolol 100mg  daily. Nurtec and naratriptan used for abortive therapy. She reports headaches were worse for about two weeks after last visit but since have been well managed. She has not had breakthrough headaches like normal.   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 She continues Qulipta and atenolol (recently increased to 100mg ). Nurtec and naratripan alternated for abortive therapy. She reports headaches are fairly stable. Rare migraines for first 8-9 weeks. She does have daily migraines for the last two weeks before next procedure. She uses abortive meds QOD for the last two weeks. Prednisone taper helps as well. Last taper 09/03/2022.   06/17/2022 ALL: Christina [name] returns for Botox. We increased atenolol to 75mg  in 04/2022 after headaches worsened. She continues [place] daily. Nurtec and naratritpan works best for abortive therapy. Trudesha not covered.   03/20/2022 ALL: Christina [name] returns for Botox. We increased atenolol to 50mg  daily and continued Qulipta. She reports daily headaches are significantly better. She may have 1 headache per week. She may have 2 migraines on average per month. She uses either Nurtec, ubrelvy or naratriptan for abortive therapy.   12/19/2021 ALL: She returns for Botox. She was seen in follow up with Dr Lucia Gaskins via Mychart 12/10/2021 and started on atenolol 12.5mg  daily. She has failed multiple  oral and injection preventatives. She continues [place]. She feels that she is doing a little better. She did have a migraine yesterday thought to be related to the weather. Dr Lucia Gaskins gave her Ubrelvy 16 tabs per month. She also has Nurtec and naratriptan which help with abortive therapy. She is aware not to take more than prescribed dose and to try to avoid regular use of abortive agents.   09/16/2021 ALL: She returns for Botox. She is having more frequent and intense migraines. Tummy tuck over the summer then DVTs. Hemoglobin dropped and had GI eval. More stress. About 4-5 migraines a month but some toward end of Emgality/Botox cycle are severe and intractable. Medrol dose pack did not help much last week. Nurtec and naratriptan do help some. Tried and failed topiramate, nortriptyline, propranolol, metoprolol, Amovig, Emgality, Ajovy, gabapentin, sumatriptan, rizatriptan, Botox. We will try Qulipta 60mg  daily.  06/12/2021 ALL: She returns for Botox procedure. She had difficulty with an intractable migraines that lasted for over a week. It was finally aborted with migraine cocktail and steroid taper. She usually has about 1 migraine per month that is easily aborted with Nurtec. She is feeling well, today.   02/27/2021 ALL: She continues to do well on Botox. Greater than 50% reduction in migraines. Emgality and rizatriptan working well.   11/29/2020 ALL: She continues to do well. She continues Botox and Emgality. Rizatriptan works well for abortive therapy. Average 1 migraine per month.    Consent Form Botulism Toxin Injection For Chronic Migraine    Reviewed orally with patient, additionally signature is on file:  Botulism toxin has been approved by the Federal drug administration for treatment of chronic migraine. Botulism  toxin does not cure chronic migraine and it may not be effective in some patients.  The administration of botulism toxin is accomplished by injecting a small amount of toxin into  the muscles of the neck and head. Dosage must be titrated for each individual. Any benefits resulting from botulism toxin tend to wear off after 3 months with a repeat injection required if benefit is to be maintained. Injections are usually done every 3-4 months with maximum effect peak achieved by about 2 or 3 weeks. Botulism toxin is expensive and you should be sure of what costs you will incur resulting from the injection.  The side effects of botulism toxin use for chronic migraine may include:   -Transient, and usually mild, facial weakness with facial injections  -Transient, and usually mild, head or neck weakness with head/neck injections  -Reduction or loss of forehead facial animation due to forehead muscle weakness  -Eyelid drooping  -Dry eye  -Pain at the site of injection or bruising at the site of injection  -Double vision  -Potential unknown long term risks   Contraindications: You should not have Botox if you are pregnant, nursing, allergic to albumin, have an infection, skin condition, or muscle weakness at the site of the injection, or have myasthenia gravis, Lambert-Eaton syndrome, or ALS.  It is also possible that as with any injection, there may be an allergic reaction or no effect from the medication. Reduced effectiveness after repeated injections is sometimes seen and rarely infection at the injection site may occur. All care will be taken to prevent these side effects. If therapy is given over a long time, atrophy and wasting in the muscle injected may occur. Occasionally the patient's become refractory to treatment because they develop antibodies to the toxin. In this event, therapy needs to be modified.  I have read the above information and consent to the administration of botulism toxin.    BOTOX PROCEDURE NOTE FOR MIGRAINE HEADACHE  Contraindications and precautions discussed with patient(above). Aseptic procedure was observed and patient tolerated procedure.  Procedure performed by Shawnie Dapper, FNP-C.   The condition has existed for more than 6 months, and pt does not have a diagnosis of ALS, Myasthenia Gravis or Lambert-Eaton Syndrome.  Risks and benefits of injections discussed and pt agrees to proceed with the procedure.  Written consent obtained  These injections are medically necessary. Pt  receives good benefits from these injections. These injections do not cause sedations or hallucinations which the oral therapies may cause.   Description of procedure:  The patient was placed in a sitting position. The standard protocol was used for Botox as follows, with 5 units of Botox injected at each site:  -Procerus muscle, midline injection  -Corrugator muscle, bilateral injection  -Frontalis muscle, bilateral injection, with 2 sites each side, medial injection was performed in the upper one third of the frontalis muscle, in the region vertical from the medial inferior edge of the superior orbital rim. The lateral injection was again in the upper one third of the forehead vertically above the lateral limbus of the cornea, 1.5 cm lateral to the medial injection site.  -Temporalis muscle injection, 4 sites, bilaterally. The first injection was 3 cm above the tragus of the ear, second injection site was 1.5 cm to 3 cm up from the first injection site in line with the tragus of the ear. The third injection site was 1.5-3 cm forward between the first 2 injection sites. The fourth injection site was 1.5 cm  posterior to the second injection site. 5th site laterally in the temporalis  muscleat the level of the outer canthus.  -Occipitalis muscle injection, 5 sites, bilaterally. The first injection was done one half way between the occipital protuberance and the tip of the mastoid process behind the ear. The second injection site was done lateral and superior to the first, 1 fingerbreadth from the first injection. The third injection site was 1 fingerbreadth  superiorly and medially from the first injection site.  -Cervical paraspinal muscle injection, 2 sites, bilaterally. The first injection site was 1 cm from the midline of the cervical spine, 3 cm inferior to the lower border of the occipital protuberance. The second injection site was 1.5 cm superiorly and laterally to the first injection site.  -Trapezius muscle injection was performed at 3 sites, bilaterally. The first injection site was in the upper trapezius muscle halfway between the inflection point of the neck, and the acromion. The second injection site was one half way between the acromion and the first injection site. The third injection was done between the first injection site and the inflection point of the neck.   Will return for repeat injection in 3 months.   A total of 200 units of Botox was prepared, 155 units of Botox was injected as documented above, 45 units of Botox was wasted. The patient tolerated the procedure well, there were no complications of the above procedure.

## 2023-06-02 ENCOUNTER — Ambulatory Visit: Payer: Medicare Other | Admitting: Family Medicine

## 2023-06-02 ENCOUNTER — Encounter: Payer: Self-pay | Admitting: Family Medicine

## 2023-06-02 DIAGNOSIS — G43709 Chronic migraine without aura, not intractable, without status migrainosus: Secondary | ICD-10-CM | POA: Diagnosis not present

## 2023-06-02 MED ORDER — ONABOTULINUMTOXINA 100 UNITS IJ SOLR
155.0000 [IU] | Freq: Once | INTRAMUSCULAR | Status: AC
  Administered 2023-06-02: 155 [IU] via INTRAMUSCULAR

## 2023-06-02 NOTE — Progress Notes (Signed)
Botox- 100 units x 2 vials Lot: [id number] Expiration: 06/2025 NDC: 4540-9811-91  Bacteriostatic 0.9% Sodium Chloride- 2 mL  Lot: [id number] Expiration: 09/24/2024 NDC:0409-1966-02  Dx:G43.709 B/B Witnessed by: Leandra Kern, CMA

## 2023-06-23 ENCOUNTER — Other Ambulatory Visit: Payer: Self-pay | Admitting: Interventional Radiology

## 2023-06-23 DIAGNOSIS — Z86718 Personal history of other venous thrombosis and embolism: Secondary | ICD-10-CM

## 2023-06-23 DIAGNOSIS — I871 Compression of vein: Secondary | ICD-10-CM

## 2023-06-29 DIAGNOSIS — L820 Inflamed seborrheic keratosis: Secondary | ICD-10-CM | POA: Diagnosis not present

## 2023-07-06 DIAGNOSIS — H353131 Nonexudative age-related macular degeneration, bilateral, early dry stage: Secondary | ICD-10-CM | POA: Diagnosis not present

## 2023-07-06 DIAGNOSIS — H3530 Unspecified macular degeneration: Secondary | ICD-10-CM | POA: Diagnosis not present

## 2023-07-06 DIAGNOSIS — H15122 Nodular episcleritis, left eye: Secondary | ICD-10-CM | POA: Diagnosis not present

## 2023-07-16 DIAGNOSIS — H2513 Age-related nuclear cataract, bilateral: Secondary | ICD-10-CM | POA: Diagnosis not present

## 2023-07-16 DIAGNOSIS — H35443 Age-related reticular degeneration of retina, bilateral: Secondary | ICD-10-CM | POA: Diagnosis not present

## 2023-07-16 DIAGNOSIS — H353132 Nonexudative age-related macular degeneration, bilateral, intermediate dry stage: Secondary | ICD-10-CM | POA: Diagnosis not present

## 2023-07-16 DIAGNOSIS — H43823 Vitreomacular adhesion, bilateral: Secondary | ICD-10-CM | POA: Diagnosis not present

## 2023-07-21 ENCOUNTER — Encounter: Payer: Self-pay | Admitting: Family Medicine

## 2023-07-21 ENCOUNTER — Telehealth: Payer: Self-pay

## 2023-07-21 ENCOUNTER — Other Ambulatory Visit: Payer: Self-pay | Admitting: Family Medicine

## 2023-07-21 MED ORDER — METHYLPREDNISOLONE 4 MG PO TBPK: ORAL_TABLET | ORAL | 0 refills | Status: DC

## 2023-07-21 NOTE — Telephone Encounter (Signed)
This encounter was created in error - please disregard.

## 2023-07-21 NOTE — Telephone Encounter (Signed)
Pt returned call. Please call back when available. 

## 2023-07-21 NOTE — Telephone Encounter (Signed)
CAll to patient to follow up on CPAP supplies and new machine she thinks was supposed to be ordered. Supplies were ordered but no new machine. Left message to call office back and ask for POD 1 staff

## 2023-07-21 NOTE — Telephone Encounter (Signed)
Pt has returned the call in response to message from 10:15 this morning, message being routed to POD 1 to call pt back

## 2023-07-21 NOTE — Telephone Encounter (Signed)
Call to patient, no answer. Left message to call office back.

## 2023-07-21 NOTE — Addendum Note (Signed)
Addended by: Lenn Cal on: 07/21/2023 10:19 AM   Modules accepted: Orders, Level of Service

## 2023-07-21 NOTE — Telephone Encounter (Signed)
DISREGARD ENCOUNTER PLEASE SEE OTHER ENCOUNTER FROM 08.27.2024

## 2023-07-28 ENCOUNTER — Ambulatory Visit (HOSPITAL_COMMUNITY)
Admission: RE | Admit: 2023-07-28 | Discharge: 2023-07-28 | Disposition: A | Discharge status: Home / Self Care | Payer: Medicare Other | Source: Ambulatory Visit | Attending: Interventional Radiology | Admitting: Interventional Radiology

## 2023-07-28 DIAGNOSIS — Z86718 Personal history of other venous thrombosis and embolism: Secondary | ICD-10-CM | POA: Insufficient documentation

## 2023-07-28 DIAGNOSIS — I82402 Acute embolism and thrombosis of unspecified deep veins of left lower extremity: Secondary | ICD-10-CM | POA: Diagnosis not present

## 2023-07-28 DIAGNOSIS — T82858A Stenosis of vascular prosthetic devices, implants and grafts, initial encounter: Secondary | ICD-10-CM | POA: Diagnosis not present

## 2023-07-28 MED ORDER — SODIUM CHLORIDE (PF) 0.9 % IJ SOLN
INTRAMUSCULAR | Status: AC
  Filled 2023-07-28: qty 50

## 2023-07-28 MED ORDER — IOHEXOL 350 MG/ML SOLN
100.0000 mL | Freq: Once | INTRAVENOUS | Status: AC | PRN
  Administered 2023-07-28: 100 mL via INTRAVENOUS

## 2023-07-31 ENCOUNTER — Telehealth (HOSPITAL_COMMUNITY): Payer: Self-pay | Admitting: Student

## 2023-07-31 ENCOUNTER — Ambulatory Visit
Admission: RE | Admit: 2023-07-31 | Discharge: 2023-07-31 | Disposition: A | Discharge status: Home / Self Care | Payer: Medicare Other | Source: Ambulatory Visit | Attending: Interventional Radiology | Admitting: Interventional Radiology

## 2023-07-31 DIAGNOSIS — Z86718 Personal history of other venous thrombosis and embolism: Secondary | ICD-10-CM

## 2023-07-31 DIAGNOSIS — Z95828 Presence of other vascular implants and grafts: Secondary | ICD-10-CM | POA: Diagnosis not present

## 2023-07-31 DIAGNOSIS — I82422 Acute embolism and thrombosis of left iliac vein: Secondary | ICD-10-CM | POA: Diagnosis not present

## 2023-07-31 HISTORY — PX: IR RADIOLOGIST EVAL & MGMT: IMG5224

## 2023-07-31 MED ORDER — APIXABAN 5 MG PO TABS: 5.0000 mg | ORAL_TABLET | Freq: Two times a day (BID) | ORAL | 5 refills | Status: DC

## 2023-07-31 NOTE — Telephone Encounter (Signed)
Eliquis 5 mg BID e-prescribed to the patient's preferred pharmacy [facility] on [address]. Patient to remain on this drug indefinitely. 90 day supply with 5 refills submitted.   Alwyn Ren, [place] [phone] 07/31/2023, 12:09 PM
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
## 2023-08-13 ENCOUNTER — Other Ambulatory Visit (HOSPITAL_COMMUNITY): Payer: Self-pay | Admitting: Interventional Radiology

## 2023-08-13 DIAGNOSIS — I70202 Unspecified atherosclerosis of native arteries of extremities, left leg: Secondary | ICD-10-CM

## 2023-08-24 NOTE — Progress Notes (Unsigned)
08/26/2023 ALL: Christina [name] returns for Botox. Christina [name] is doing well. Christina [name] continues Air traffic controller. Christina [name] averages about 5-10 headache days over the past 3 months with 2-3 migraine days a month. Christina [name] usually takes Nurtec and naratriptan with Tylenol. Using about 4-5 tablets of these per month. Occasionally uses phenergan for nausea. May take 2-3 tablets a month at most.   06/02/2023 ALL: Christina [name] returns for Botox. Christina [name] continues to do well. Christina [name] continues Qulipta 60mg  and atenolol 100mg  daily. Nurtec and naratriptan alternated for abortive therapy. Christina [name] is doing well. Christina [name] averages 2-3 headache days a month with 1-2 migrainous headaches.   03/03/2023 ALL: Christina [name] returns for Botox. Christina [name] continues Qulipta 60mg  and atenolol 100mg  daily. Nurtec and naratriptan alternated for abortive therapy. Christina [name] is doing well. Christina [name] averages 2-3 headache days a month with 1-2 migrainous headaches.   12/09/2022 ALL: Christina [name] returns for Botox. Christina [name] continues Qulipta 60mg  daily and atenolol 100mg  daily. Nurtec and naratriptan used for abortive therapy. Christina [name] reports headaches were worse for about two weeks after last visit but since have been well managed. Christina [name] has not had breakthrough headaches like normal.   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 Christina [name] continues Qulipta and atenolol (recently increased to 100mg ). Nurtec and naratripan alternated for abortive therapy. Christina [name] reports headaches are fairly stable. Rare migraines for first 8-9 weeks. Christina [name] does have daily migraines for the last two weeks before next procedure. Christina [name] uses abortive meds QOD for the last two weeks. Prednisone taper helps as well. Last taper 09/03/2022.   06/17/2022 ALL: Christina [name] returns for Botox. We increased atenolol to 75mg  in 04/2022 after headaches worsened. Christina [name] continues [place] daily. Nurtec and naratritpan works best for abortive therapy. Trudesha not covered.   03/20/2022 ALL: Christina [name] returns for Botox. We increased atenolol to 50mg  daily and continued Qulipta. Christina [name]  reports daily headaches are significantly better. Christina [name] may have 1 headache per week. Christina [name] may have 2 migraines on average per month. Christina [name] uses either Nurtec, ubrelvy or naratriptan for abortive therapy.   12/19/2021 ALL: Christina [name] returns for Botox. Christina [name] was seen in follow up with Dr Lucia Gaskins via Mychart 12/10/2021 and started on atenolol 12.5mg  daily. Christina [name] has failed multiple oral and injection preventatives. Christina [name] continues [place]. Christina [name] feels that Christina [name] is doing a little better. Christina [name] did have a migraine yesterday thought to be related to the weather. Dr Lucia Gaskins gave her Ubrelvy 16 tabs per month. Christina [name] also has Nurtec and naratriptan which help with abortive therapy. Christina [name] is aware not to take more than prescribed dose and to try to avoid regular use of abortive agents.   09/16/2021 ALL: Christina [name] returns for Botox. Christina [name] is having more frequent and intense migraines. Tummy tuck over the summer then DVTs. Hemoglobin dropped and had GI eval. More stress. About 4-5 migraines a month but some toward end of Emgality/Botox cycle are severe and intractable. Medrol dose pack did not help much last week. Nurtec and naratriptan do help some. Tried and failed topiramate, nortriptyline, propranolol, metoprolol, Amovig, Emgality, Ajovy, gabapentin, sumatriptan, rizatriptan, Botox. We will try Qulipta 60mg  daily.  06/12/2021 ALL: Christina [name] returns for Botox procedure. Christina [name] had difficulty with an intractable migraines that lasted for over a week. It was finally aborted with migraine cocktail and steroid taper. Christina [name] usually has about 1 migraine per month that is easily aborted with Nurtec. Christina [name] is feeling well, today.   02/27/2021 ALL: Christina [name] continues to do well on Botox. Greater than 50% reduction in migraines. Emgality and rizatriptan working well.  11/29/2020 ALL: Christina [name] continues to do well. Christina [name] continues Botox and Emgality. Rizatriptan works well for abortive therapy. Average 1 migraine per month.    Consent Form Botulism Toxin Injection For Chronic  Migraine    Reviewed orally with patient, additionally signature is on file:  Botulism toxin has been approved by the Federal drug administration for treatment of chronic migraine. Botulism toxin does not cure chronic migraine and it may not be effective in some patients.  The administration of botulism toxin is accomplished by injecting a small amount of toxin into the muscles of the neck and head. Dosage must be titrated for each individual. Any benefits resulting from botulism toxin tend to wear off after 3 months with a repeat injection required if benefit is to be maintained. Injections are usually done every 3-4 months with maximum effect peak achieved by about 2 or 3 weeks. Botulism toxin is expensive and you should be sure of what costs you will incur resulting from the injection.  The side effects of botulism toxin use for chronic migraine may include:   -Transient, and usually mild, facial weakness with facial injections  -Transient, and usually mild, head or neck weakness with head/neck injections  -Reduction or loss of forehead facial animation due to forehead muscle weakness  -Eyelid drooping  -Dry eye  -Pain at the site of injection or bruising at the site of injection  -Double vision  -Potential unknown long term risks   Contraindications: You should not have Botox if you are pregnant, nursing, allergic to albumin, have an infection, skin condition, or muscle weakness at the site of the injection, or have myasthenia gravis, Lambert-Eaton syndrome, or ALS.  It is also possible that as with any injection, there may be an allergic reaction or no effect from the medication. Reduced effectiveness after repeated injections is sometimes seen and rarely infection at the injection site may occur. All care will be taken to prevent these side effects. If therapy is given over a long time, atrophy and wasting in the muscle injected may occur. Occasionally the patient's become refractory to  treatment because they develop antibodies to the toxin. In this event, therapy needs to be modified.  I have read the above information and consent to the administration of botulism toxin.    BOTOX PROCEDURE NOTE FOR MIGRAINE HEADACHE  Contraindications and precautions discussed with patient(above). Aseptic procedure was observed and patient tolerated procedure. Procedure performed by Shawnie Dapper, FNP-C.   The condition has existed for more than 6 months, and pt does not have a diagnosis of ALS, Myasthenia Gravis or Lambert-Eaton Syndrome.  Risks and benefits of injections discussed and pt agrees to proceed with the procedure.  Written consent obtained  These injections are medically necessary. Pt  receives good benefits from these injections. These injections do not cause sedations or hallucinations which the oral therapies may cause.   Description of procedure:  The patient was placed in a sitting position. The standard protocol was used for Botox as follows, with 5 units of Botox injected at each site:  -Procerus muscle, midline injection  -Corrugator muscle, bilateral injection  -Frontalis muscle, bilateral injection, with 2 sites each side, medial injection was performed in the upper one third of the frontalis muscle, in the region vertical from the medial inferior edge of the superior orbital rim. The lateral injection was again in the upper one third of the forehead vertically above the lateral limbus of the cornea, 1.5 cm lateral to the medial injection site.  -  Temporalis muscle injection, 4 sites, bilaterally. The first injection was 3 cm above the tragus of the ear, second injection site was 1.5 cm to 3 cm up from the first injection site in line with the tragus of the ear. The third injection site was 1.5-3 cm forward between the first 2 injection sites. The fourth injection site was 1.5 cm posterior to the second injection site. 5th site laterally in the temporalis  muscleat the  level of the outer canthus.  -Occipitalis muscle injection, 5 sites, bilaterally. The first injection was done one half way between the occipital protuberance and the tip of the mastoid process behind the ear. The second injection site was done lateral and superior to the first, 1 fingerbreadth from the first injection. The third injection site was 1 fingerbreadth superiorly and medially from the first injection site.  -Cervical paraspinal muscle injection, 2 sites, bilaterally. The first injection site was 1 cm from the midline of the cervical spine, 3 cm inferior to the lower border of the occipital protuberance. The second injection site was 1.5 cm superiorly and laterally to the first injection site.  -Trapezius muscle injection was performed at 3 sites, bilaterally. The first injection site was in the upper trapezius muscle halfway between the inflection point of the neck, and the acromion. The second injection site was one half way between the acromion and the first injection site. The third injection was done between the first injection site and the inflection point of the neck.   Will return for repeat injection in 3 months.   A total of 200 units of Botox was prepared, 155 units of Botox was injected as documented above, 45 units of Botox was wasted. The patient tolerated the procedure well, there were no complications of the above procedure.

## 2023-08-26 ENCOUNTER — Ambulatory Visit: Payer: Medicare Other | Admitting: Family Medicine

## 2023-08-26 ENCOUNTER — Other Ambulatory Visit (HOSPITAL_COMMUNITY): Payer: Self-pay | Admitting: Student

## 2023-08-26 DIAGNOSIS — G43709 Chronic migraine without aura, not intractable, without status migrainosus: Secondary | ICD-10-CM | POA: Diagnosis not present

## 2023-08-26 DIAGNOSIS — J392 Other diseases of pharynx: Secondary | ICD-10-CM | POA: Diagnosis not present

## 2023-08-26 DIAGNOSIS — Z7901 Long term (current) use of anticoagulants: Secondary | ICD-10-CM

## 2023-08-26 MED ORDER — PROMETHAZINE HCL 25 MG PO TABS: ORAL_TABLET | ORAL | 1 refills | Status: DC

## 2023-08-26 MED ORDER — ONABOTULINUMTOXINA 200 UNITS IJ SOLR
155.0000 [IU] | Freq: Once | INTRAMUSCULAR | Status: AC
  Administered 2023-08-26: 155 [IU] via INTRAMUSCULAR

## 2023-08-26 NOTE — Progress Notes (Signed)
Botox- 200 units x 1 vial Lot: [id number] Expiration: 12/2025 NDC: 7425-9563-87  Bacteriostatic 0.9% Sodium Chloride- * mL  Lot: [id number] Expiration: 02/23/2024 NDC: 9518-8416-60  Dx: Y30.160 B/B Witnessed by Marcelina Morel, RN
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
## 2023-08-31 ENCOUNTER — Other Ambulatory Visit (HOSPITAL_COMMUNITY): Payer: Self-pay | Admitting: Interventional Radiology

## 2023-08-31 ENCOUNTER — Encounter (HOSPITAL_COMMUNITY): Payer: Self-pay

## 2023-08-31 ENCOUNTER — Ambulatory Visit (HOSPITAL_COMMUNITY)
Admission: RE | Admit: 2023-08-31 | Discharge: 2023-08-31 | Disposition: A | Discharge status: Home / Self Care | Payer: Medicare Other | Source: Ambulatory Visit | Attending: Interventional Radiology | Admitting: Interventional Radiology

## 2023-08-31 ENCOUNTER — Other Ambulatory Visit: Payer: Self-pay

## 2023-08-31 DIAGNOSIS — Y832 Surgical operation with anastomosis, bypass or graft as the cause of abnormal reaction of the patient, or of later complication, without mention of misadventure at the time of the procedure: Secondary | ICD-10-CM | POA: Insufficient documentation

## 2023-08-31 DIAGNOSIS — I480 Paroxysmal atrial fibrillation: Secondary | ICD-10-CM | POA: Insufficient documentation

## 2023-08-31 DIAGNOSIS — Z85828 Personal history of other malignant neoplasm of skin: Secondary | ICD-10-CM | POA: Insufficient documentation

## 2023-08-31 DIAGNOSIS — I70202 Unspecified atherosclerosis of native arteries of extremities, left leg: Secondary | ICD-10-CM

## 2023-08-31 DIAGNOSIS — Z7982 Long term (current) use of aspirin: Secondary | ICD-10-CM | POA: Insufficient documentation

## 2023-08-31 DIAGNOSIS — Z7901 Long term (current) use of anticoagulants: Secondary | ICD-10-CM | POA: Diagnosis not present

## 2023-08-31 DIAGNOSIS — T82868A Thrombosis of vascular prosthetic devices, implants and grafts, initial encounter: Secondary | ICD-10-CM | POA: Diagnosis not present

## 2023-08-31 DIAGNOSIS — I82422 Acute embolism and thrombosis of left iliac vein: Secondary | ICD-10-CM | POA: Insufficient documentation

## 2023-08-31 HISTORY — PX: IR US GUIDE VASC ACCESS LEFT: IMG2389

## 2023-08-31 HISTORY — PX: IR THROMBECT PRIM MECH INIT (INCLU) MOD SED: IMG2297

## 2023-08-31 HISTORY — PX: IR ILIAC ART PTA UNI INITIAL MOD SED: IMG2305

## 2023-08-31 HISTORY — PX: IR INTRAVASCULAR ULTRASOUND NON CORONARY: IMG6085

## 2023-08-31 LAB — PROTIME-INR
INR: 1.3 — ABNORMAL HIGH (ref 0.8–1.2)
Prothrombin Time: 16.8 s — ABNORMAL HIGH (ref 11.4–15.2)

## 2023-08-31 LAB — BASIC METABOLIC PANEL
Anion gap: 5 (ref 5–15)
BUN: 12 mg/dL (ref 8–23)
CO2: 27 mmol/L (ref 22–32)
Calcium: 8.6 mg/dL — ABNORMAL LOW (ref 8.9–10.3)
Chloride: 108 mmol/L (ref 98–111)
Creatinine, Ser: 0.77 mg/dL (ref 0.44–1.00)
GFR, Estimated: >60 mL/min (ref >60)
Glucose, Bld: 99 mg/dL (ref 70–99)
Potassium: 3.9 mmol/L (ref 3.5–5.1)
Sodium: 140 mmol/L (ref 135–145)

## 2023-08-31 LAB — CBC
HCT: 39.1 % (ref 36.0–46.0)
Hemoglobin: 12.6 g/dL (ref 12.0–15.0)
MCH: 29.8 pg (ref 26.0–34.0)
MCHC: 32.2 g/dL (ref 30.0–36.0)
MCV: 92.4 fL (ref 80.0–100.0)
Platelets: 213 10*3/uL (ref 150–400)
RBC: 4.23 MIL/uL (ref 3.87–5.11)
RDW: 13.3 % (ref 11.5–15.5)
WBC: 6.9 10*3/uL (ref 4.0–10.5)
nRBC: 0 % (ref 0.0–0.2)

## 2023-08-31 MED ORDER — MIDAZOLAM HCL 2 MG/2ML IJ SOLN
INTRAMUSCULAR | Status: AC
  Filled 2023-08-31: qty 2

## 2023-08-31 MED ORDER — IODIXANOL 320 MG/ML IV SOLN: 100.0000 mL | Freq: Once | INTRAVENOUS | Status: DC | PRN

## 2023-08-31 MED ORDER — FENTANYL CITRATE (PF) 100 MCG/2ML IJ SOLN
INTRAMUSCULAR | Status: AC
  Filled 2023-08-31: qty 2

## 2023-08-31 MED ORDER — SODIUM CHLORIDE 0.9 % IV SOLN: INTRAVENOUS | Status: DC

## 2023-08-31 MED ORDER — LIDOCAINE-EPINEPHRINE 1 %-1:100000 IJ SOLN
INTRAMUSCULAR | Status: AC
  Filled 2023-08-31: qty 1

## 2023-08-31 MED ORDER — FENTANYL CITRATE (PF) 100 MCG/2ML IJ SOLN
INTRAMUSCULAR | Status: AC | PRN
  Administered 2023-08-31 (×4): 25 ug via INTRAVENOUS

## 2023-08-31 MED ORDER — MIDAZOLAM HCL 2 MG/2ML IJ SOLN
INTRAMUSCULAR | Status: AC | PRN
  Administered 2023-08-31: 1 mg via INTRAVENOUS
  Administered 2023-08-31 (×4): .5 mg via INTRAVENOUS

## 2023-08-31 NOTE — Progress Notes (Signed)
Patient alert and oriented. Resting comfortably with warm blankets. Left fem site and pulses assessed at bedside with receiving RN. Site clean dry and intact. No hematoma. VSS. Receiving RN denies having any questions at this time. Patient denies any s/s of distress. None observed. Patient confirms having all belongings

## 2023-08-31 NOTE — Procedures (Signed)
Interventional Radiology Procedure Note  Procedure:  1) Left lower extremity venogram 2) Aspiration thrombectomy of left iliac vein stents 3) Balloon angioplasty of left iliac vein stents  Findings: Please refer to procedural dictation for full description.  Moderate in-stent stenosis of the indwelling left iliac vein stents.  16 Fr Flash aspiration thrombectomy followed by balloon angioplasty to 16 mm centrally and 14 mm peripherally.  Improved patency and flow upon completion.  Left common femoral vein 16 Fr sheath access, closed with 2 Proglides + manual compression.  Complications: None immediate  Estimated Blood Loss: 50 mL  Recommendations: 2 hour bedrest with head of bed up to 30 degrees for the first hour.   Continue anticoagulation/antiplatelet. Plan for 2 month follow up in IR with repeat CTV abdomen/pelvis.   Marliss Coots, MD

## 2023-08-31 NOTE — Discharge Instructions (Signed)
Femoral Site Care This sheet gives you information about how to care for yourself after your procedure. Your health care provider may also give you more specific instructions. If you have problems or questions, contact your health care provider. What can I expect after the procedure?  After the procedure, it is common to have: Bruising that usually fades within 1-2 weeks. Tenderness at the site. Follow these instructions at home: Wound care Follow instructions from your health care provider about how to take care of your insertion site. Make sure you: Wash your hands with soap and water before you change your bandage (dressing). If soap and water are not available, use hand sanitizer. Remove your dressing as told by your health care provider. 24 hours Do not take baths, swim, or use a hot tub until your health care provider approves. You may shower 24-48 hours after the procedure or as told by your health care provider. Gently wash the site with plain soap and water. Pat the area dry with a clean towel. Do not rub the site. This may cause bleeding. Do not apply powder or lotion to the site. Keep the site clean and dry. Check your femoral site every day for signs of infection. Check for: Redness, swelling, or pain. Fluid or blood. Warmth. Pus or a bad smell. Activity For the first 2-3 days after your procedure, or as long as directed: Avoid climbing stairs as much as possible. Do not squat. Do not lift anything that is heavier than 10 lb (4.5 kg), or the limit that you are told, until your health care provider says that it is safe. For 2 days Rest as directed. Avoid sitting for a long time without moving. Get up to take short walks every 1-2 hours. Do not drive for 24 hours if you were given a medicine to help you relax (sedative). General instructions Take over-the-counter and prescription medicines only as told by your health care provider. Keep all follow-up visits as told by your  health care provider. This is important. Contact a health care provider if you have: A fever or chills. You have redness, swelling, or pain around your insertion site. Get help right away if: The catheter insertion area swells very fast. You pass out. You suddenly start to sweat or your skin gets clammy. The catheter insertion area is bleeding, and the bleeding does not stop when you hold steady pressure on the area.  These symptoms may represent a serious problem that is an emergency. Do not wait to see if the symptoms will go away. Get medical help right away. Call your local emergency services (911 in the [place] Do not drive yourself to the hospital. Summary After the procedure, it is common to have bruising that usually fades within 1-2 weeks. Check your femoral site every day for signs of infection. Do not lift anything that is heavier than 10 lb (4.5 kg), or the limit that you are told, until your health care provider says that it is safe. This information is not intended to replace advice given to you by your health care provider. Make sure you discuss any questions you have with your health care provider. Document Revised: 11/23/2017 Document Reviewed: 11/23/2017 Elsevier Patient Education  2020 [employer].

## 2023-08-31 NOTE — Progress Notes (Addendum)
While ambulating patient to restroom after bedrest, fem site started to bled, immediately held pressure and got patient back in stretcher, held a total of 12 minutes. VSS throughout hold, site now level 0. Patient states she feels okay and no pain. Alwyn Ren, NP notified. Gave verbal to do another hour of bedrest.  Also verified if patient was still okay to resume her Eliquis tonight, NP stated yes.

## 2023-09-15 DIAGNOSIS — Z7901 Long term (current) use of anticoagulants: Secondary | ICD-10-CM | POA: Diagnosis not present

## 2023-09-15 DIAGNOSIS — K2960 Other gastritis without bleeding: Secondary | ICD-10-CM | POA: Diagnosis not present

## 2023-09-22 ENCOUNTER — Other Ambulatory Visit: Payer: Self-pay

## 2023-09-22 MED ORDER — ATENOLOL 25 MG PO TABS: 100.0000 mg | ORAL_TABLET | Freq: Every day | ORAL | 0 refills | Status: DC

## 2023-09-24 DIAGNOSIS — D225 Melanocytic nevi of trunk: Secondary | ICD-10-CM | POA: Diagnosis not present

## 2023-09-24 DIAGNOSIS — L570 Actinic keratosis: Secondary | ICD-10-CM | POA: Diagnosis not present

## 2023-09-24 DIAGNOSIS — L578 Other skin changes due to chronic exposure to nonionizing radiation: Secondary | ICD-10-CM | POA: Diagnosis not present

## 2023-09-24 DIAGNOSIS — L820 Inflamed seborrheic keratosis: Secondary | ICD-10-CM | POA: Diagnosis not present

## 2023-09-24 DIAGNOSIS — B079 Viral wart, unspecified: Secondary | ICD-10-CM | POA: Diagnosis not present

## 2023-09-24 DIAGNOSIS — L821 Other seborrheic keratosis: Secondary | ICD-10-CM | POA: Diagnosis not present

## 2023-09-24 DIAGNOSIS — D0471 Carcinoma in situ of skin of right lower limb, including hip: Secondary | ICD-10-CM | POA: Diagnosis not present

## 2023-09-24 DIAGNOSIS — D485 Neoplasm of uncertain behavior of skin: Secondary | ICD-10-CM | POA: Diagnosis not present

## 2023-09-24 DIAGNOSIS — I872 Venous insufficiency (chronic) (peripheral): Secondary | ICD-10-CM | POA: Diagnosis not present

## 2023-10-05 DIAGNOSIS — J392 Other diseases of pharynx: Secondary | ICD-10-CM | POA: Diagnosis not present

## 2023-10-08 DIAGNOSIS — H2513 Age-related nuclear cataract, bilateral: Secondary | ICD-10-CM | POA: Diagnosis not present

## 2023-10-08 DIAGNOSIS — H5203 Hypermetropia, bilateral: Secondary | ICD-10-CM | POA: Diagnosis not present

## 2023-10-08 DIAGNOSIS — H52223 Regular astigmatism, bilateral: Secondary | ICD-10-CM | POA: Diagnosis not present

## 2023-10-08 DIAGNOSIS — H353131 Nonexudative age-related macular degeneration, bilateral, early dry stage: Secondary | ICD-10-CM | POA: Diagnosis not present

## 2023-10-08 DIAGNOSIS — H524 Presbyopia: Secondary | ICD-10-CM | POA: Diagnosis not present

## 2023-10-08 DIAGNOSIS — H53143 Visual discomfort, bilateral: Secondary | ICD-10-CM | POA: Diagnosis not present

## 2023-10-12 DIAGNOSIS — Z780 Asymptomatic menopausal state: Secondary | ICD-10-CM | POA: Diagnosis not present

## 2023-10-12 DIAGNOSIS — E559 Vitamin D deficiency, unspecified: Secondary | ICD-10-CM | POA: Diagnosis not present

## 2023-10-12 DIAGNOSIS — M810 Age-related osteoporosis without current pathological fracture: Secondary | ICD-10-CM | POA: Diagnosis not present

## 2023-10-27 DIAGNOSIS — J392 Other diseases of pharynx: Secondary | ICD-10-CM | POA: Diagnosis not present

## 2023-10-30 DIAGNOSIS — D0471 Carcinoma in situ of skin of right lower limb, including hip: Secondary | ICD-10-CM | POA: Diagnosis not present

## 2023-11-11 ENCOUNTER — Encounter: Payer: Self-pay | Admitting: Interventional Radiology

## 2023-11-11 ENCOUNTER — Other Ambulatory Visit: Payer: Self-pay | Admitting: Family Medicine

## 2023-11-11 ENCOUNTER — Encounter: Payer: Self-pay | Admitting: Family Medicine

## 2023-11-11 ENCOUNTER — Other Ambulatory Visit: Payer: Self-pay | Admitting: Interventional Radiology

## 2023-11-11 DIAGNOSIS — I871 Compression of vein: Secondary | ICD-10-CM

## 2023-11-11 DIAGNOSIS — I8290 Acute embolism and thrombosis of unspecified vein: Secondary | ICD-10-CM

## 2023-11-11 MED ORDER — METHYLPREDNISOLONE 4 MG PO TBPK: ORAL_TABLET | ORAL | 0 refills | Status: DC

## 2023-11-12 ENCOUNTER — Ambulatory Visit (HOSPITAL_COMMUNITY)
Admission: RE | Admit: 2023-11-12 | Discharge: 2023-11-12 | Disposition: A | Discharge status: Home / Self Care | Payer: Medicare Other | Source: Ambulatory Visit | Attending: Interventional Radiology | Admitting: Interventional Radiology

## 2023-11-12 DIAGNOSIS — I871 Compression of vein: Secondary | ICD-10-CM | POA: Insufficient documentation

## 2023-11-12 DIAGNOSIS — I82402 Acute embolism and thrombosis of unspecified deep veins of left lower extremity: Secondary | ICD-10-CM | POA: Diagnosis not present

## 2023-11-12 DIAGNOSIS — I8290 Acute embolism and thrombosis of unspecified vein: Secondary | ICD-10-CM | POA: Diagnosis not present

## 2023-11-12 MED ORDER — SODIUM CHLORIDE (PF) 0.9 % IJ SOLN
INTRAMUSCULAR | Status: AC
  Filled 2023-11-12: qty 50

## 2023-11-12 MED ORDER — IOHEXOL 350 MG/ML SOLN
100.0000 mL | Freq: Once | INTRAVENOUS | Status: AC | PRN
  Administered 2023-11-12: 100 mL via INTRAVENOUS

## 2023-11-20 ENCOUNTER — Telehealth: Payer: Self-pay

## 2023-11-20 NOTE — Telephone Encounter (Signed)
Pharmacy Patient Advocate Encounter   Received notification from CoverMyMeds that prior authorization for Qulipta 60MG  tablets is required/requested.   Insurance verification completed.   The patient is insured through [facility] .   Per test claim: PA required; PA submitted to above mentioned insurance via CoverMyMeds Key/confirmation #/EOC [facility] Status is pending
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
## 2023-11-22 NOTE — Progress Notes (Unsigned)
11/23/2023 ALL: Christina [name] returns for Botox. She was given steroid taper two weeks ago for intractable migraine but otherwise doing well. She continues atenolol 100mg  and Qulipta 60mg  daily. Averaging 1 headache per month until last two weeks prior to next Botox then nearly daily headaches. Nurtec and naratriptan usually help.   08/26/2023 ALL: Christina [name] returns for Botox. She is doing well. She continues Air traffic controller. She averages about 5-10 headache days over the past 3 months with 2-3 migraine days a month. She usually takes Nurtec and naratriptan with Tylenol. Using about 4-5 tablets of these per month. Occasionally uses phenergan for nausea. May take 2-3 tablets a month at most.   06/02/2023 ALL: Christina [name] returns for Botox. She continues to do well. She continues Qulipta 60mg  and atenolol 100mg  daily. Nurtec and naratriptan alternated for abortive therapy. She is doing well. She averages 2-3 headache days a month with 1-2 migrainous headaches.   03/03/2023 ALL: Christina [name] returns for Botox. She continues Qulipta 60mg  and atenolol 100mg  daily. Nurtec and naratriptan alternated for abortive therapy. She is doing well. She averages 2-3 headache days a month with 1-2 migrainous headaches.   12/09/2022 ALL: Christina [name] returns for Botox. She continues Qulipta 60mg  daily and atenolol 100mg  daily. Nurtec and naratriptan used for abortive therapy. She reports headaches were worse for about two weeks after last visit but since have been well managed. She has not had breakthrough headaches like normal.   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 She continues Qulipta and atenolol (recently increased to 100mg ). Nurtec and naratripan alternated for abortive therapy. She reports headaches are fairly stable. Rare migraines for first 8-9 weeks. She does have daily migraines for the last two weeks before next procedure. She uses abortive meds QOD for the last two weeks. Prednisone taper helps as well. Last taper  09/03/2022.   06/17/2022 ALL: Christina [name] returns for Botox. We increased atenolol to 75mg  in 04/2022 after headaches worsened. She continues [place] daily. Nurtec and naratritpan works best for abortive therapy. Trudesha not covered.   03/20/2022 ALL: Christina [name] returns for Botox. We increased atenolol to 50mg  daily and continued Qulipta. She reports daily headaches are significantly better. She may have 1 headache per week. She may have 2 migraines on average per month. She uses either Nurtec, ubrelvy or naratriptan for abortive therapy.   12/19/2021 ALL: She returns for Botox. She was seen in follow up with Dr Lucia Gaskins via Mychart 12/10/2021 and started on atenolol 12.5mg  daily. She has failed multiple oral and injection preventatives. She continues [place]. She feels that she is doing a little better. She did have a migraine yesterday thought to be related to the weather. Dr Lucia Gaskins gave her Ubrelvy 16 tabs per month. She also has Nurtec and naratriptan which help with abortive therapy. She is aware not to take more than prescribed dose and to try to avoid regular use of abortive agents.   09/16/2021 ALL: She returns for Botox. She is having more frequent and intense migraines. Tummy tuck over the summer then DVTs. Hemoglobin dropped and had GI eval. More stress. About 4-5 migraines a month but some toward end of Emgality/Botox cycle are severe and intractable. Medrol dose pack did not help much last week. Nurtec and naratriptan do help some. Tried and failed topiramate, nortriptyline, propranolol, metoprolol, Amovig, Emgality, Ajovy, gabapentin, sumatriptan, rizatriptan, Botox. We will try Qulipta 60mg  daily.  06/12/2021 ALL: She returns for Botox procedure. She had difficulty with an intractable migraines that lasted for over a week.  It was finally aborted with migraine cocktail and steroid taper. She usually has about 1 migraine per month that is easily aborted with Nurtec. She is feeling well, today.   02/27/2021  ALL: She continues to do well on Botox. Greater than 50% reduction in migraines. Emgality and rizatriptan working well.   11/29/2020 ALL: She continues to do well. She continues Botox and Emgality. Rizatriptan works well for abortive therapy. Average 1 migraine per month.    Consent Form Botulism Toxin Injection For Chronic Migraine    Reviewed orally with patient, additionally signature is on file:  Botulism toxin has been approved by the Federal drug administration for treatment of chronic migraine. Botulism toxin does not cure chronic migraine and it may not be effective in some patients.  The administration of botulism toxin is accomplished by injecting a small amount of toxin into the muscles of the neck and head. Dosage must be titrated for each individual. Any benefits resulting from botulism toxin tend to wear off after 3 months with a repeat injection required if benefit is to be maintained. Injections are usually done every 3-4 months with maximum effect peak achieved by about 2 or 3 weeks. Botulism toxin is expensive and you should be sure of what costs you will incur resulting from the injection.  The side effects of botulism toxin use for chronic migraine may include:   -Transient, and usually mild, facial weakness with facial injections  -Transient, and usually mild, head or neck weakness with head/neck injections  -Reduction or loss of forehead facial animation due to forehead muscle weakness  -Eyelid drooping  -Dry eye  -Pain at the site of injection or bruising at the site of injection  -Double vision  -Potential unknown long term risks   Contraindications: You should not have Botox if you are pregnant, nursing, allergic to albumin, have an infection, skin condition, or muscle weakness at the site of the injection, or have myasthenia gravis, Lambert-Eaton syndrome, or ALS.  It is also possible that as with any injection, there may be an allergic reaction or no effect from  the medication. Reduced effectiveness after repeated injections is sometimes seen and rarely infection at the injection site may occur. All care will be taken to prevent these side effects. If therapy is given over a long time, atrophy and wasting in the muscle injected may occur. Occasionally the patient's become refractory to treatment because they develop antibodies to the toxin. In this event, therapy needs to be modified.  I have read the above information and consent to the administration of botulism toxin.    BOTOX PROCEDURE NOTE FOR MIGRAINE HEADACHE  Contraindications and precautions discussed with patient(above). Aseptic procedure was observed and patient tolerated procedure. Procedure performed by Shawnie Dapper, FNP-C.   The condition has existed for more than 6 months, and pt does not have a diagnosis of ALS, Myasthenia Gravis or Lambert-Eaton Syndrome.  Risks and benefits of injections discussed and pt agrees to proceed with the procedure.  Written consent obtained  These injections are medically necessary. Pt  receives good benefits from these injections. These injections do not cause sedations or hallucinations which the oral therapies may cause.   Description of procedure:  The patient was placed in a sitting position. The standard protocol was used for Botox as follows, with 5 units of Botox injected at each site:  -Procerus muscle, midline injection  -Corrugator muscle, bilateral injection  -Frontalis muscle, bilateral injection, with 2 sites each side, medial injection was  performed in the upper one third of the frontalis muscle, in the region vertical from the medial inferior edge of the superior orbital rim. The lateral injection was again in the upper one third of the forehead vertically above the lateral limbus of the cornea, 1.5 cm lateral to the medial injection site.  -Temporalis muscle injection, 4 sites, bilaterally. The first injection was 3 cm above the tragus of  the ear, second injection site was 1.5 cm to 3 cm up from the first injection site in line with the tragus of the ear. The third injection site was 1.5-3 cm forward between the first 2 injection sites. The fourth injection site was 1.5 cm posterior to the second injection site. 5th site laterally in the temporalis  muscleat the level of the outer canthus.  -Occipitalis muscle injection, 5 sites, bilaterally. The first injection was done one half way between the occipital protuberance and the tip of the mastoid process behind the ear. The second injection site was done lateral and superior to the first, 1 fingerbreadth from the first injection. The third injection site was 1 fingerbreadth superiorly and medially from the first injection site.  -Cervical paraspinal muscle injection, 2 sites, bilaterally. The first injection site was 1 cm from the midline of the cervical spine, 3 cm inferior to the lower border of the occipital protuberance. The second injection site was 1.5 cm superiorly and laterally to the first injection site.  -Trapezius muscle injection was performed at 3 sites, bilaterally. The first injection site was in the upper trapezius muscle halfway between the inflection point of the neck, and the acromion. The second injection site was one half way between the acromion and the first injection site. The third injection was done between the first injection site and the inflection point of the neck.   Will return for repeat injection in 3 months.   A total of 200 units of Botox was prepared, 155 units of Botox was injected as documented above, 45 units of Botox was wasted. The patient tolerated the procedure well, there were no complications of the above procedure.

## 2023-11-23 ENCOUNTER — Other Ambulatory Visit (HOSPITAL_COMMUNITY): Payer: Self-pay

## 2023-11-23 ENCOUNTER — Ambulatory Visit: Payer: Medicare Other | Admitting: Family Medicine

## 2023-11-23 ENCOUNTER — Ambulatory Visit
Admission: RE | Admit: 2023-11-23 | Discharge: 2023-11-23 | Disposition: A | Discharge status: Home / Self Care | Payer: Medicare Other | Source: Ambulatory Visit | Attending: Interventional Radiology | Admitting: Interventional Radiology

## 2023-11-23 DIAGNOSIS — G43709 Chronic migraine without aura, not intractable, without status migrainosus: Secondary | ICD-10-CM

## 2023-11-23 DIAGNOSIS — I871 Compression of vein: Secondary | ICD-10-CM

## 2023-11-23 DIAGNOSIS — I8290 Acute embolism and thrombosis of unspecified vein: Secondary | ICD-10-CM

## 2023-11-23 DIAGNOSIS — Z95828 Presence of other vascular implants and grafts: Secondary | ICD-10-CM | POA: Diagnosis not present

## 2023-11-23 HISTORY — PX: IR RADIOLOGIST EVAL & MGMT: IMG5224

## 2023-11-23 MED ORDER — ONABOTULINUMTOXINA 200 UNITS IJ SOLR
155.0000 [IU] | Freq: Once | INTRAMUSCULAR | Status: AC
Start: 2023-11-23 — End: 2023-11-23
  Administered 2023-11-23: 155 [IU] via INTRAMUSCULAR

## 2023-11-23 NOTE — Telephone Encounter (Signed)
Pharmacy Patient Advocate Encounter  Received notification from [facility] that Prior Authorization for Qulipta 60MG  tablets has been APPROVED from 11/20/2023 to 11/23/2024. Unable to obtain price due to refill too soon rejection, last fill date 11/21/2023 next available fill date1/20/2025   PA #/Case ID/Reference #: PA Case ID #: [id number]

## 2023-11-23 NOTE — Progress Notes (Signed)
Botox- 200 units x 1 vial Lot: [id number] Expiration: 01/2026 NDC: 4540-9811-91  Bacteriostatic 0.9% Sodium Chloride- * mL  Lot: [id number] Expiration: 09/24/2024 NDC: 6213-0865-78  Dx: I69.629 B/B Witnessed by: Leandra Kern, CMA

## 2023-12-02 DIAGNOSIS — Z1211 Encounter for screening for malignant neoplasm of colon: Secondary | ICD-10-CM | POA: Diagnosis not present

## 2023-12-02 DIAGNOSIS — K2970 Gastritis, unspecified, without bleeding: Secondary | ICD-10-CM | POA: Diagnosis not present

## 2023-12-02 DIAGNOSIS — D123 Benign neoplasm of transverse colon: Secondary | ICD-10-CM | POA: Diagnosis not present

## 2023-12-02 DIAGNOSIS — K2930 Chronic superficial gastritis without bleeding: Secondary | ICD-10-CM | POA: Diagnosis not present

## 2023-12-04 DIAGNOSIS — K2930 Chronic superficial gastritis without bleeding: Secondary | ICD-10-CM | POA: Diagnosis not present

## 2023-12-04 DIAGNOSIS — D123 Benign neoplasm of transverse colon: Secondary | ICD-10-CM | POA: Diagnosis not present

## 2023-12-15 DIAGNOSIS — Z1231 Encounter for screening mammogram for malignant neoplasm of breast: Secondary | ICD-10-CM | POA: Diagnosis not present

## 2023-12-17 ENCOUNTER — Other Ambulatory Visit: Payer: Self-pay | Admitting: Obstetrics and Gynecology

## 2023-12-17 DIAGNOSIS — R928 Other abnormal and inconclusive findings on diagnostic imaging of breast: Secondary | ICD-10-CM

## 2023-12-25 ENCOUNTER — Encounter: Payer: Self-pay | Admitting: Family Medicine

## 2023-12-25 ENCOUNTER — Other Ambulatory Visit: Payer: Self-pay | Admitting: Family Medicine

## 2023-12-25 ENCOUNTER — Other Ambulatory Visit: Payer: Self-pay

## 2023-12-25 MED ORDER — ATENOLOL 25 MG PO TABS: 100.0000 mg | ORAL_TABLET | Freq: Every day | ORAL | 3 refills | Status: DC

## 2023-12-26 ENCOUNTER — Ambulatory Visit
Admission: RE | Admit: 2023-12-26 | Discharge: 2023-12-26 | Disposition: A | Discharge status: Home / Self Care | Payer: Medicare Other | Source: Ambulatory Visit | Attending: Obstetrics and Gynecology | Admitting: Obstetrics and Gynecology

## 2023-12-26 DIAGNOSIS — R928 Other abnormal and inconclusive findings on diagnostic imaging of breast: Secondary | ICD-10-CM

## 2023-12-26 DIAGNOSIS — N6325 Unspecified lump in the left breast, overlapping quadrants: Secondary | ICD-10-CM | POA: Diagnosis not present

## 2023-12-26 DIAGNOSIS — N6002 Solitary cyst of left breast: Secondary | ICD-10-CM | POA: Diagnosis not present

## 2024-01-01 ENCOUNTER — Other Ambulatory Visit (HOSPITAL_COMMUNITY): Payer: Self-pay

## 2024-01-01 ENCOUNTER — Telehealth: Payer: Self-pay | Admitting: Pharmacy Technician

## 2024-01-01 NOTE — Telephone Encounter (Signed)
 Pharmacy Patient Advocate Encounter   Received notification from CoverMyMeds that prior authorization for Nurtec 75MG  dispersible tablets is required/requested.   Insurance verification completed.   The patient is insured through [employer] .   Per test claim: The current 30 day co-pay is, $310.10.  No PA needed at this time. This test claim was processed through [facility]- copay amounts may vary at other pharmacies due to pharmacy/plan contracts, or as the patient moves through the different stages of their insurance plan.

## 2024-01-19 ENCOUNTER — Telehealth: Payer: Self-pay | Admitting: Family Medicine

## 2024-01-19 NOTE — Telephone Encounter (Signed)
 Submitted Botox auth via UHC portal, received approval.  auth#: [id number] (01/19/24-01/18/25)

## 2024-01-22 DIAGNOSIS — H35443 Age-related reticular degeneration of retina, bilateral: Secondary | ICD-10-CM | POA: Diagnosis not present

## 2024-01-22 DIAGNOSIS — H2513 Age-related nuclear cataract, bilateral: Secondary | ICD-10-CM | POA: Diagnosis not present

## 2024-01-22 DIAGNOSIS — H43823 Vitreomacular adhesion, bilateral: Secondary | ICD-10-CM | POA: Diagnosis not present

## 2024-01-22 DIAGNOSIS — H353132 Nonexudative age-related macular degeneration, bilateral, intermediate dry stage: Secondary | ICD-10-CM | POA: Diagnosis not present

## 2024-01-26 ENCOUNTER — Other Ambulatory Visit: Payer: Self-pay

## 2024-01-26 MED ORDER — QULIPTA 60 MG PO TABS: 60.0000 mg | ORAL_TABLET | Freq: Every day | ORAL | 3 refills | Status: DC

## 2024-02-02 ENCOUNTER — Other Ambulatory Visit: Payer: Self-pay | Admitting: *Deleted

## 2024-02-09 ENCOUNTER — Telehealth (HOSPITAL_COMMUNITY): Payer: Self-pay | Admitting: Student

## 2024-02-09 MED ORDER — APIXABAN 5 MG PO TABS: 5.0000 mg | ORAL_TABLET | Freq: Two times a day (BID) | ORAL | 5 refills | Status: AC

## 2024-02-09 NOTE — Telephone Encounter (Signed)
 Eliquis refill e-prescribed.   Alwyn Ren, AGACNP-BC 02/09/2024, 2:44 PM

## 2024-02-10 NOTE — Progress Notes (Signed)
 02/15/2024 ALL: Christina [name] returns for Botox. She reports migraines have been well managed since last visit. She had 2 migraines in January and 1 in Feb. None in March. She added magnesium to Co Q10 and feels this has helped. She stocked up on naratriptan and Nurtec but hasn't needed them recently.   11/23/2023 ALL: Christina [name] returns for Botox. She was given steroid taper two weeks ago for intractable migraine but otherwise doing well. She continues atenolol 100mg  and Qulipta 60mg  daily. Averaging 1 headache per month until last two weeks prior to next Botox then nearly daily headaches. Nurtec and naratriptan usually help.   08/26/2023 ALL: Christina [name] returns for Botox. She is doing well. She continues [employer]. She averages about 5-10 headache days over the past 3 months with 2-3 migraine days a month. She usually takes Nurtec and naratriptan with Tylenol. Using about 4-5 tablets of these per month. Occasionally uses phenergan for nausea. May take 2-3 tablets a month at most.   06/02/2023 ALL: Christina [name] returns for Botox. She continues to do well. She continues Qulipta 60mg  and atenolol 100mg  daily. Nurtec and naratriptan alternated for abortive therapy. She is doing well. She averages 2-3 headache days a month with 1-2 migrainous headaches.   03/03/2023 ALL: Christina [name] returns for Botox. She continues Qulipta 60mg  and atenolol 100mg  daily. Nurtec and naratriptan alternated for abortive therapy. She is doing well. She averages 2-3 headache days a month with 1-2 migrainous headaches.   12/09/2022 ALL: Christina [name] returns for Botox. She continues Qulipta 60mg  daily and atenolol 100mg  daily. Nurtec and naratriptan used for abortive therapy. She reports headaches were worse for about two weeks after last visit but since have been well managed. She has not had breakthrough headaches like normal.   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 She continues Qulipta and atenolol (recently increased to 100mg ). Nurtec and  naratripan alternated for abortive therapy. She reports headaches are fairly stable. Rare migraines for first 8-9 weeks. She does have daily migraines for the last two weeks before next procedure. She uses abortive meds QOD for the last two weeks. Prednisone taper helps as well. Last taper 09/03/2022.   06/17/2022 ALL: Christina [name] returns for Botox. We increased atenolol to 75mg  in 04/2022 after headaches worsened. She continues [place] daily. Nurtec and naratritpan works best for abortive therapy. Trudesha not covered.   03/20/2022 ALL: Christina [name] returns for Botox. We increased atenolol to 50mg  daily and continued Qulipta. She reports daily headaches are significantly better. She may have 1 headache per week. She may have 2 migraines on average per month. She uses either Nurtec, ubrelvy or naratriptan for abortive therapy.   12/19/2021 ALL: She returns for Botox. She was seen in follow up with Dr Lucia Gaskins via Mychart 12/10/2021 and started on atenolol 12.5mg  daily. She has failed multiple oral and injection preventatives. She continues [place]. She feels that she is doing a little better. She did have a migraine yesterday thought to be related to the weather. Dr Lucia Gaskins gave her Ubrelvy 16 tabs per month. She also has Nurtec and naratriptan which help with abortive therapy. She is aware not to take more than prescribed dose and to try to avoid regular use of abortive agents.   09/16/2021 ALL: She returns for Botox. She is having more frequent and intense migraines. Tummy tuck over the summer then DVTs. Hemoglobin dropped and had GI eval. More stress. About 4-5 migraines a month but some toward end of Emgality/Botox cycle are severe and intractable. Medrol dose  pack did not help much last week. Nurtec and naratriptan do help some. Tried and failed topiramate, nortriptyline, propranolol, metoprolol, Amovig, Emgality, Ajovy, gabapentin, sumatriptan, rizatriptan, Botox. We will try Qulipta 60mg  daily.  06/12/2021 ALL: She  returns for Botox procedure. She had difficulty with an intractable migraines that lasted for over a week. It was finally aborted with migraine cocktail and steroid taper. She usually has about 1 migraine per month that is easily aborted with Nurtec. She is feeling well, today.   02/27/2021 ALL: She continues to do well on Botox. Greater than 50% reduction in migraines. Emgality and rizatriptan working well.   11/29/2020 ALL: She continues to do well. She continues Botox and Emgality. Rizatriptan works well for abortive therapy. Average 1 migraine per month.    Consent Form Botulism Toxin Injection For Chronic Migraine    Reviewed orally with patient, additionally signature is on file:  Botulism toxin has been approved by the Federal drug administration for treatment of chronic migraine. Botulism toxin does not cure chronic migraine and it may not be effective in some patients.  The administration of botulism toxin is accomplished by injecting a small amount of toxin into the muscles of the neck and head. Dosage must be titrated for each individual. Any benefits resulting from botulism toxin tend to wear off after 3 months with a repeat injection required if benefit is to be maintained. Injections are usually done every 3-4 months with maximum effect peak achieved by about 2 or 3 weeks. Botulism toxin is expensive and you should be sure of what costs you will incur resulting from the injection.  The side effects of botulism toxin use for chronic migraine may include:   -Transient, and usually mild, facial weakness with facial injections  -Transient, and usually mild, head or neck weakness with head/neck injections  -Reduction or loss of forehead facial animation due to forehead muscle weakness  -Eyelid drooping  -Dry eye  -Pain at the site of injection or bruising at the site of injection  -Double vision  -Potential unknown long term risks   Contraindications: You should not have Botox if  you are pregnant, nursing, allergic to albumin, have an infection, skin condition, or muscle weakness at the site of the injection, or have myasthenia gravis, Lambert-Eaton syndrome, or ALS.  It is also possible that as with any injection, there may be an allergic reaction or no effect from the medication. Reduced effectiveness after repeated injections is sometimes seen and rarely infection at the injection site may occur. All care will be taken to prevent these side effects. If therapy is given over a long time, atrophy and wasting in the muscle injected may occur. Occasionally the patient's become refractory to treatment because they develop antibodies to the toxin. In this event, therapy needs to be modified.  I have read the above information and consent to the administration of botulism toxin.    BOTOX PROCEDURE NOTE FOR MIGRAINE HEADACHE  Contraindications and precautions discussed with patient(above). Aseptic procedure was observed and patient tolerated procedure. Procedure performed by Shawnie Dapper, FNP-C.   The condition has existed for more than 6 months, and pt does not have a diagnosis of ALS, Myasthenia Gravis or Lambert-Eaton Syndrome.  Risks and benefits of injections discussed and pt agrees to proceed with the procedure.  Written consent obtained  These injections are medically necessary. Pt  receives good benefits from these injections. These injections do not cause sedations or hallucinations which the oral therapies may cause.  Description of procedure:  The patient was placed in a sitting position. The standard protocol was used for Botox as follows, with 5 units of Botox injected at each site:  -Procerus muscle, midline injection  -Corrugator muscle, bilateral injection  -Frontalis muscle, bilateral injection, with 2 sites each side, medial injection was performed in the upper one third of the frontalis muscle, in the region vertical from the medial inferior edge of the  superior orbital rim. The lateral injection was again in the upper one third of the forehead vertically above the lateral limbus of the cornea, 1.5 cm lateral to the medial injection site.  -Temporalis muscle injection, 4 sites, bilaterally. The first injection was 3 cm above the tragus of the ear, second injection site was 1.5 cm to 3 cm up from the first injection site in line with the tragus of the ear. The third injection site was 1.5-3 cm forward between the first 2 injection sites. The fourth injection site was 1.5 cm posterior to the second injection site. 5th site laterally in the temporalis  muscleat the level of the outer canthus.  -Occipitalis muscle injection, 5 sites, bilaterally. The first injection was done one half way between the occipital protuberance and the tip of the mastoid process behind the ear. The second injection site was done lateral and superior to the first, 1 fingerbreadth from the first injection. The third injection site was 1 fingerbreadth superiorly and medially from the first injection site.  -Cervical paraspinal muscle injection, 2 sites, bilaterally. The first injection site was 1 cm from the midline of the cervical spine, 3 cm inferior to the lower border of the occipital protuberance. The second injection site was 1.5 cm superiorly and laterally to the first injection site.  -Trapezius muscle injection was performed at 3 sites, bilaterally. The first injection site was in the upper trapezius muscle halfway between the inflection point of the neck, and the acromion. The second injection site was one half way between the acromion and the first injection site. The third injection was done between the first injection site and the inflection point of the neck.   Will return for repeat injection in 3 months.   A total of 200 units of Botox was prepared, 155 units of Botox was injected as documented above, 45 units of Botox was wasted. The patient tolerated the  procedure well, there were no complications of the above procedure.

## 2024-02-15 ENCOUNTER — Encounter: Payer: Self-pay | Admitting: Family Medicine

## 2024-02-15 ENCOUNTER — Ambulatory Visit: Payer: Medicare Other | Admitting: Family Medicine

## 2024-02-15 DIAGNOSIS — G43709 Chronic migraine without aura, not intractable, without status migrainosus: Secondary | ICD-10-CM

## 2024-02-15 MED ORDER — ONABOTULINUMTOXINA 200 UNITS IJ SOLR
155.0000 [IU] | Freq: Once | INTRAMUSCULAR | Status: AC
Start: 2024-02-15 — End: 2024-02-15
  Administered 2024-02-15: 155 [IU] via INTRAMUSCULAR

## 2024-02-15 NOTE — Progress Notes (Signed)
 Botox- 200 units x 1 vial Lot: [id number] Expiration: 02/2026 NDC: 6644-0347-42  Bacteriostatic 0.9% Sodium Chloride- 4ml Lot: [id number]  Expiration: 09/2024  NDC: 7564-3329-51  Dx: O84.166  B/B Witnessed by Audree Bane

## 2024-02-16 NOTE — Progress Notes (Unsigned)
  Electrophysiology Office Note:   Date:  02/17/2024  ID:  Christina [name], DOB 01/25/1951, MRN [id number]  Primary Cardiologist: None Primary Heart Failure: None Electrophysiologist: Christina Quesenberry Christina Loa, MD      History of Present Illness:   Christina [name] is a 73 y.o. female with h/o DVT/PE, hypertension, atrial fibrillation/flutter seen today for routine electrophysiology followup.   Since last being seen in our clinic the patient reports doing well.  She has no chest pain or shortness of breath.  She had all her daily activities.  She has noted that over the last few years, she has had to take a break when she is exerting herself.  She can work in her garden for an hour and then has to take a break.  After a break she can go back to working without issue.  She has noted no episodes of atrial fibrillation.  she denies chest pain, palpitations, dyspnea, PND, orthopnea, nausea, vomiting, dizziness, syncope, edema, weight gain, or early satiety.   Review of systems complete and found to be negative unless listed in HPI.   EP Information / Studies Reviewed:    EKG is ordered today. Personal review as below.  EKG Interpretation Date/Time:  Wednesday February 17 2024 09:46:08 EDT Ventricular Rate:  68 PR Interval:  134 QRS Duration:  68 QT Interval:  420 QTC Calculation: 446 R Axis:   51  Text Interpretation: Sinus rhythm with Premature atrial complexes in a pattern of bigeminy ST & T wave abnormality, consider anterior ischemia When compared with ECG of 06-Apr-2023 09:42, Premature atrial complexes are now Present Confirmed by Kalum Minner ([id number]) on 02/17/2024 9:49:32 AM     Risk Assessment/Calculations:    CHA2DS2-VASc Score = 3   This indicates a 3.2% annual risk of stroke. The patient's score is based upon: CHF History: 0 HTN History: 1 Diabetes History: 0 Stroke History: 0 Vascular Disease History: 0 Age Score: 1 Gender Score: 1             Physical Exam:   VS:  BP  120/72 (BP Location: Left Arm, Patient Position: Sitting, Cuff Size: Normal)   Pulse 68   Ht 5\' 3"  (1.6 m)   Wt 150 lb (68 kg)   SpO2 96%   BMI 26.57 kg/m    Wt Readings from Last 3 Encounters:  02/17/24 150 lb (68 kg)  08/31/23 150 lb (68 kg)  04/06/23 150 lb 9.6 oz (68.3 kg)     GEN: Well nourished, well developed in no acute distress NECK: No JVD; No carotid bruits CARDIAC: Regular rate and rhythm, no murmurs, rubs, gallops RESPIRATORY:  Clear to auscultation without rales, wheezing or rhonchi  ABDOMEN: Soft, non-tender, non-distended EXTREMITIES:  No edema; No deformity   ASSESSMENT AND PLAN:    1.  Persistent atrial fibrillation/flutter: Post ablation in 2018 and 2019.  Currently on atenolol 100 mg daily.  He has had no further episodes of atrial fibrillation.  She is quite happy with her control.  Christina [name] continue with current management.  I did offer for her to follow-up with her primary physician and see [place] back on an as-needed basis.  She would prefer to see [place] back yearly.  This can be discussed at her next appointment as well.  2.  Secondary hypercoagulable state: Currently on Eliquis for atrial fibrillation  3.  Hypertension: Well-controlled  Follow up with [facility] in 12 months  Signed, Christina [name] Christina Loa, MD

## 2024-02-17 ENCOUNTER — Ambulatory Visit: Payer: Medicare Other | Attending: Cardiology | Admitting: Cardiology

## 2024-02-17 ENCOUNTER — Encounter: Payer: Self-pay | Admitting: Cardiology

## 2024-02-17 VITALS — BP 120/72 | HR 68 | Ht 63.0 in | Wt 150.0 lb

## 2024-02-17 DIAGNOSIS — I4819 Other persistent atrial fibrillation: Secondary | ICD-10-CM | POA: Diagnosis not present

## 2024-02-17 DIAGNOSIS — D6869 Other thrombophilia: Secondary | ICD-10-CM

## 2024-02-17 DIAGNOSIS — I10 Essential (primary) hypertension: Secondary | ICD-10-CM

## 2024-03-01 DIAGNOSIS — M7662 Achilles tendinitis, left leg: Secondary | ICD-10-CM | POA: Diagnosis not present

## 2024-03-01 DIAGNOSIS — M79672 Pain in left foot: Secondary | ICD-10-CM | POA: Diagnosis not present

## 2024-03-03 DIAGNOSIS — M7662 Achilles tendinitis, left leg: Secondary | ICD-10-CM | POA: Diagnosis not present

## 2024-03-03 DIAGNOSIS — M79672 Pain in left foot: Secondary | ICD-10-CM | POA: Diagnosis not present

## 2024-03-07 ENCOUNTER — Encounter: Payer: Self-pay | Admitting: Cardiology

## 2024-03-15 DIAGNOSIS — M79672 Pain in left foot: Secondary | ICD-10-CM | POA: Diagnosis not present

## 2024-03-15 DIAGNOSIS — M7662 Achilles tendinitis, left leg: Secondary | ICD-10-CM | POA: Diagnosis not present

## 2024-03-21 DIAGNOSIS — M79672 Pain in left foot: Secondary | ICD-10-CM | POA: Diagnosis not present

## 2024-03-21 DIAGNOSIS — M810 Age-related osteoporosis without current pathological fracture: Secondary | ICD-10-CM | POA: Diagnosis not present

## 2024-03-21 DIAGNOSIS — M7662 Achilles tendinitis, left leg: Secondary | ICD-10-CM | POA: Diagnosis not present

## 2024-03-21 DIAGNOSIS — E559 Vitamin D deficiency, unspecified: Secondary | ICD-10-CM | POA: Diagnosis not present

## 2024-03-24 DIAGNOSIS — H35443 Age-related reticular degeneration of retina, bilateral: Secondary | ICD-10-CM | POA: Diagnosis not present

## 2024-03-24 DIAGNOSIS — H43823 Vitreomacular adhesion, bilateral: Secondary | ICD-10-CM | POA: Diagnosis not present

## 2024-03-24 DIAGNOSIS — H353132 Nonexudative age-related macular degeneration, bilateral, intermediate dry stage: Secondary | ICD-10-CM | POA: Diagnosis not present

## 2024-03-24 DIAGNOSIS — H2513 Age-related nuclear cataract, bilateral: Secondary | ICD-10-CM | POA: Diagnosis not present

## 2024-03-25 DIAGNOSIS — M810 Age-related osteoporosis without current pathological fracture: Secondary | ICD-10-CM | POA: Diagnosis not present

## 2024-03-25 DIAGNOSIS — E559 Vitamin D deficiency, unspecified: Secondary | ICD-10-CM | POA: Diagnosis not present

## 2024-03-29 DIAGNOSIS — L578 Other skin changes due to chronic exposure to nonionizing radiation: Secondary | ICD-10-CM | POA: Diagnosis not present

## 2024-03-29 DIAGNOSIS — L820 Inflamed seborrheic keratosis: Secondary | ICD-10-CM | POA: Diagnosis not present

## 2024-03-29 DIAGNOSIS — D225 Melanocytic nevi of trunk: Secondary | ICD-10-CM | POA: Diagnosis not present

## 2024-03-29 DIAGNOSIS — B078 Other viral warts: Secondary | ICD-10-CM | POA: Diagnosis not present

## 2024-03-29 DIAGNOSIS — D485 Neoplasm of uncertain behavior of skin: Secondary | ICD-10-CM | POA: Diagnosis not present

## 2024-03-29 DIAGNOSIS — Z85828 Personal history of other malignant neoplasm of skin: Secondary | ICD-10-CM | POA: Diagnosis not present

## 2024-03-29 DIAGNOSIS — L570 Actinic keratosis: Secondary | ICD-10-CM | POA: Diagnosis not present

## 2024-04-01 DIAGNOSIS — M7662 Achilles tendinitis, left leg: Secondary | ICD-10-CM | POA: Diagnosis not present

## 2024-04-01 DIAGNOSIS — M79672 Pain in left foot: Secondary | ICD-10-CM | POA: Diagnosis not present

## 2024-04-05 ENCOUNTER — Ambulatory Visit (HOSPITAL_COMMUNITY): Payer: Medicare Other | Admitting: Physician Assistant

## 2024-04-06 DIAGNOSIS — M7662 Achilles tendinitis, left leg: Secondary | ICD-10-CM | POA: Diagnosis not present

## 2024-04-11 ENCOUNTER — Telehealth: Payer: Self-pay | Admitting: Family Medicine

## 2024-04-11 NOTE — Telephone Encounter (Signed)
 Pt Called to Schedule appt.  Appt Scheduled

## 2024-04-11 NOTE — Telephone Encounter (Signed)
 LVM and sent mychart msg informing pt of need to reschedule 05/12/24 appt - NP out

## 2024-04-13 DIAGNOSIS — M79672 Pain in left foot: Secondary | ICD-10-CM | POA: Diagnosis not present

## 2024-04-13 DIAGNOSIS — M7662 Achilles tendinitis, left leg: Secondary | ICD-10-CM | POA: Diagnosis not present

## 2024-04-26 DIAGNOSIS — R070 Pain in throat: Secondary | ICD-10-CM | POA: Diagnosis not present

## 2024-05-05 DIAGNOSIS — H353132 Nonexudative age-related macular degeneration, bilateral, intermediate dry stage: Secondary | ICD-10-CM | POA: Diagnosis not present

## 2024-05-12 ENCOUNTER — Ambulatory Visit: Admitting: Family Medicine

## 2024-05-16 ENCOUNTER — Other Ambulatory Visit: Payer: Self-pay | Admitting: Interventional Radiology

## 2024-05-16 DIAGNOSIS — Z86718 Personal history of other venous thrombosis and embolism: Secondary | ICD-10-CM

## 2024-05-17 NOTE — Progress Notes (Unsigned)
 05/18/2024 ALL: Christina [name] returns for Botox . She continues atenolol  100mg  and  Qulipta  60mg  daily and naratriptan  or Nurtec as needed. She is doing great. Last migraine 02/2024. Milder headaches easily aborted with Tylenol . Lost sister recently.   02/15/2024 ALL: Christina [name] returns for Botox . She reports migraines have been well managed since last visit. She had 2 migraines in January and 1 in Feb. None in March. She added magnesium to Co Q10 and feels this has helped. She stocked up on naratriptan  and Nurtec but hasn't needed them recently.   11/23/2023 ALL: Christina [name] returns for Botox . She was given steroid taper two weeks ago for intractable migraine but otherwise doing well. She continues atenolol  100mg  and Qulipta  60mg  daily. Averaging 1 headache per month until last two weeks prior to next Botox  then nearly daily headaches. Nurtec and naratriptan  usually help.   08/26/2023 ALL: Christina [name] returns for Botox . She is doing well. She continues ateolol and Qulipta . She averages about 5-10 headache days over the past 3 months with 2-3 migraine days a month. She usually takes Nurtec and naratriptan  with Tylenol . Using about 4-5 tablets of these per month. Occasionally uses phenergan  for nausea. May take 2-3 tablets a month at most.   06/02/2023 ALL: Christina [name] returns for Botox . She continues to do well. She continues Qulipta  60mg  and atenolol  100mg  daily. Nurtec and naratriptan  alternated for abortive therapy. She is doing well. She averages 2-3 headache days a month with 1-2 migrainous headaches.   03/03/2023 ALL: Christina [name] returns for Botox . She continues Qulipta  60mg  and atenolol  100mg  daily. Nurtec and naratriptan  alternated for abortive therapy. She is doing well. She averages 2-3 headache days a month with 1-2 migrainous headaches.   12/09/2022 ALL: Christina [name] returns for Botox . She continues Qulipta  60mg  daily and atenolol  100mg  daily. Nurtec and naratriptan  used for abortive therapy. She reports headaches were worse  for about two weeks after last visit but since have been well managed. She has not had breakthrough headaches like normal.   09/15/2022 ALL: Christina [name] returns for Botox . She continues Qulipta  and atenolol  (recently increased to 100mg ). Nurtec and naratripan alternated for abortive therapy. She reports headaches are fairly stable. Rare migraines for first 8-9 weeks. She does have daily migraines for the last two weeks before next procedure. She uses abortive meds QOD for the last two weeks. Prednisone  taper helps as well. Last taper 09/03/2022.   06/17/2022 ALL: Christina [name] returns for Botox . We increased atenolol  to 75mg  in 04/2022 after headaches worsened. She continues Qulipta  daily. Nurtec and naratritpan works best for abortive therapy. Trudesha not covered.   03/20/2022 ALL: Christina [name] returns for Botox . We increased atenolol  to 50mg  daily and continued Qulipta . She reports daily headaches are significantly better. She may have 1 headache per week. She may have 2 migraines on average per month. She uses either Nurtec, ubrelvy  or naratriptan  for abortive therapy.   12/19/2021 ALL: She returns for Botox . She was seen in follow up with Dr Ines via Mychart 12/10/2021 and started on atenolol  12.5mg  daily. She has failed multiple oral and injection preventatives. She continues Qulipta . She feels that she is doing a little better. She did have a migraine yesterday thought to be related to the weather. Dr Ines gave her Ubrelvy  16 tabs per month. She also has Nurtec and naratriptan  which help with abortive therapy. She is aware not to take more than prescribed dose and to try to avoid regular use of abortive agents.   09/16/2021 ALL: She returns for Botox . She is  having more frequent and intense migraines. Tummy tuck over the summer then DVTs. Hemoglobin dropped and had GI eval. More stress. About 4-5 migraines a month but some toward end of Emgality /Botox  cycle are severe and intractable. Medrol  dose pack did not help  much last week. Nurtec and naratriptan  do help some. Tried and failed topiramate , nortriptyline, propranolol, metoprolol, Amovig, Emgality , Ajovy , gabapentin , sumatriptan , rizatriptan , Botox . We will try Qulipta  60mg  daily.  06/12/2021 ALL: She returns for Botox  procedure. She had difficulty with an intractable migraines that lasted for over a week. It was finally aborted with migraine cocktail and steroid taper. She usually has about 1 migraine per month that is easily aborted with Nurtec. She is feeling well, today.   02/27/2021 ALL: She continues to do well on Botox . Greater than 50% reduction in migraines. Emgality  and rizatriptan  working well.   11/29/2020 ALL: She continues to do well. She continues Botox  and Emgality . Rizatriptan  works well for abortive therapy. Average 1 migraine per month.    Consent Form Botulism Toxin Injection For Chronic Migraine    Reviewed orally with patient, additionally signature is on file:  Botulism toxin has been approved by the Federal drug administration for treatment of chronic migraine. Botulism toxin does not cure chronic migraine and it may not be effective in some patients.  The administration of botulism toxin is accomplished by injecting a small amount of toxin into the muscles of the neck and head. Dosage must be titrated for each individual. Any benefits resulting from botulism toxin tend to wear off after 3 months with a repeat injection required if benefit is to be maintained. Injections are usually done every 3-4 months with maximum effect peak achieved by about 2 or 3 weeks. Botulism toxin is expensive and you should be sure of what costs you will incur resulting from the injection.  The side effects of botulism toxin use for chronic migraine may include:   -Transient, and usually mild, facial weakness with facial injections  -Transient, and usually mild, head or neck weakness with head/neck injections  -Reduction or loss of forehead facial  animation due to forehead muscle weakness  -Eyelid drooping  -Dry eye  -Pain at the site of injection or bruising at the site of injection  -Double vision  -Potential unknown long term risks   Contraindications: You should not have Botox  if you are pregnant, nursing, allergic to albumin, have an infection, skin condition, or muscle weakness at the site of the injection, or have myasthenia gravis, Lambert-Eaton syndrome, or ALS.  It is also possible that as with any injection, there may be an allergic reaction or no effect from the medication. Reduced effectiveness after repeated injections is sometimes seen and rarely infection at the injection site may occur. All care will be taken to prevent these side effects. If therapy is given over a long time, atrophy and wasting in the muscle injected may occur. Occasionally the patient's become refractory to treatment because they develop antibodies to the toxin. In this event, therapy needs to be modified.  I have read the above information and consent to the administration of botulism toxin.    BOTOX  PROCEDURE NOTE FOR MIGRAINE HEADACHE  Contraindications and precautions discussed with patient(above). Aseptic procedure was observed and patient tolerated procedure. Procedure performed by Greig Forbes, FNP-C.   The condition has existed for more than 6 months, and pt does not have a diagnosis of ALS, Myasthenia Gravis or Lambert-Eaton Syndrome.  Risks and benefits of injections discussed and pt  agrees to proceed with the procedure.  Written consent obtained  These injections are medically necessary. Pt  receives good benefits from these injections. These injections do not cause sedations or hallucinations which the oral therapies may cause.   Description of procedure:  The patient was placed in a sitting position. The standard protocol was used for Botox  as follows, with 5 units of Botox  injected at each site:  -Procerus muscle, midline  injection  -Corrugator muscle, bilateral injection  -Frontalis muscle, bilateral injection, with 2 sites each side, medial injection was performed in the upper one third of the frontalis muscle, in the region vertical from the medial inferior edge of the superior orbital rim. The lateral injection was again in the upper one third of the forehead vertically above the lateral limbus of the cornea, 1.5 cm lateral to the medial injection site.  -Temporalis muscle injection, 4 sites, bilaterally. The first injection was 3 cm above the tragus of the ear, second injection site was 1.5 cm to 3 cm up from the first injection site in line with the tragus of the ear. The third injection site was 1.5-3 cm forward between the first 2 injection sites. The fourth injection site was 1.5 cm posterior to the second injection site. 5th site laterally in the temporalis  muscleat the level of the outer canthus.  -Occipitalis muscle injection, 5 sites, bilaterally. The first injection was done one half way between the occipital protuberance and the tip of the mastoid process behind the ear. The second injection site was done lateral and superior to the first, 1 fingerbreadth from the first injection. The third injection site was 1 fingerbreadth superiorly and medially from the first injection site.  -Cervical paraspinal muscle injection, 2 sites, bilaterally. The first injection site was 1 cm from the midline of the cervical spine, 3 cm inferior to the lower border of the occipital protuberance. The second injection site was 1.5 cm superiorly and laterally to the first injection site.  -Trapezius muscle injection was performed at 3 sites, bilaterally. The first injection site was in the upper trapezius muscle halfway between the inflection point of the neck, and the acromion. The second injection site was one half way between the acromion and the first injection site. The third injection was done between the first injection  site and the inflection point of the neck.   Will return for repeat injection in 3 months.   A total of 200 units of Botox  was prepared, 155 units of Botox  was injected as documented above, 45 units of Botox  was wasted. The patient tolerated the procedure well, there were no complications of the above procedure.

## 2024-05-18 ENCOUNTER — Encounter: Payer: Self-pay | Admitting: Family Medicine

## 2024-05-18 ENCOUNTER — Ambulatory Visit: Admitting: Family Medicine

## 2024-05-18 VITALS — BP 115/70 | HR 57

## 2024-05-18 DIAGNOSIS — G43709 Chronic migraine without aura, not intractable, without status migrainosus: Secondary | ICD-10-CM | POA: Diagnosis not present

## 2024-05-18 MED ORDER — ONABOTULINUMTOXINA 200 UNITS IJ SOLR
155.0000 [IU] | Freq: Once | INTRAMUSCULAR | Status: AC
  Administered 2024-05-18: 155 [IU] via INTRAMUSCULAR

## 2024-05-18 NOTE — Progress Notes (Signed)
 Botox - 100 units x 2 vials Lot: [id number] Expiration: 08/2026 NDC: 9976-8854-98  Bacteriostatic 0.9% Sodium Chloride - 30 mL  Lot: [id number] Expiration: OCT-31-2026 NDC: 9590-8033-97  Dx: G43.709 B/B Witnessed ab:Xyjipgjy Izell, CMA

## 2024-05-30 ENCOUNTER — Ambulatory Visit (HOSPITAL_COMMUNITY)
Admission: RE | Admit: 2024-05-30 | Discharge: 2024-05-30 | Disposition: A | Discharge status: Home / Self Care | Source: Ambulatory Visit | Attending: Interventional Radiology | Admitting: Interventional Radiology

## 2024-05-30 ENCOUNTER — Other Ambulatory Visit: Payer: Self-pay | Admitting: Interventional Radiology

## 2024-05-30 ENCOUNTER — Ambulatory Visit
Admission: RE | Admit: 2024-05-30 | Discharge: 2024-05-30 | Disposition: A | Discharge status: Home / Self Care | Source: Ambulatory Visit | Attending: Interventional Radiology

## 2024-05-30 DIAGNOSIS — R918 Other nonspecific abnormal finding of lung field: Secondary | ICD-10-CM | POA: Diagnosis not present

## 2024-05-30 DIAGNOSIS — Z95828 Presence of other vascular implants and grafts: Secondary | ICD-10-CM | POA: Diagnosis not present

## 2024-05-30 DIAGNOSIS — Z86718 Personal history of other venous thrombosis and embolism: Secondary | ICD-10-CM | POA: Insufficient documentation

## 2024-05-30 DIAGNOSIS — I700 Atherosclerosis of aorta: Secondary | ICD-10-CM | POA: Diagnosis not present

## 2024-05-30 HISTORY — PX: IR RADIOLOGIST EVAL & MGMT: IMG5224

## 2024-05-30 MED ORDER — IOHEXOL 350 MG/ML SOLN
125.0000 mL | Freq: Once | INTRAVENOUS | Status: AC | PRN
  Administered 2024-05-30: 125 mL via INTRAVENOUS
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
## 2024-05-31 DIAGNOSIS — I10 Essential (primary) hypertension: Secondary | ICD-10-CM | POA: Diagnosis not present

## 2024-05-31 DIAGNOSIS — D485 Neoplasm of uncertain behavior of skin: Secondary | ICD-10-CM | POA: Diagnosis not present

## 2024-05-31 DIAGNOSIS — G43909 Migraine, unspecified, not intractable, without status migrainosus: Secondary | ICD-10-CM | POA: Diagnosis not present

## 2024-05-31 DIAGNOSIS — M810 Age-related osteoporosis without current pathological fracture: Secondary | ICD-10-CM | POA: Diagnosis not present

## 2024-05-31 DIAGNOSIS — I480 Paroxysmal atrial fibrillation: Secondary | ICD-10-CM | POA: Diagnosis not present

## 2024-05-31 DIAGNOSIS — D6869 Other thrombophilia: Secondary | ICD-10-CM | POA: Diagnosis not present

## 2024-05-31 DIAGNOSIS — L439 Lichen planus, unspecified: Secondary | ICD-10-CM | POA: Diagnosis not present

## 2024-05-31 DIAGNOSIS — K2960 Other gastritis without bleeding: Secondary | ICD-10-CM | POA: Diagnosis not present

## 2024-05-31 DIAGNOSIS — E7800 Pure hypercholesterolemia, unspecified: Secondary | ICD-10-CM | POA: Diagnosis not present

## 2024-05-31 DIAGNOSIS — I77810 Thoracic aortic ectasia: Secondary | ICD-10-CM | POA: Diagnosis not present

## 2024-05-31 DIAGNOSIS — L720 Epidermal cyst: Secondary | ICD-10-CM | POA: Diagnosis not present

## 2024-05-31 DIAGNOSIS — Z0000 Encounter for general adult medical examination without abnormal findings: Secondary | ICD-10-CM | POA: Diagnosis not present

## 2024-05-31 DIAGNOSIS — L820 Inflamed seborrheic keratosis: Secondary | ICD-10-CM | POA: Diagnosis not present

## 2024-06-04 DIAGNOSIS — H353132 Nonexudative age-related macular degeneration, bilateral, intermediate dry stage: Secondary | ICD-10-CM | POA: Diagnosis not present

## 2024-06-23 DIAGNOSIS — L570 Actinic keratosis: Secondary | ICD-10-CM | POA: Diagnosis not present

## 2024-06-24 DIAGNOSIS — H43823 Vitreomacular adhesion, bilateral: Secondary | ICD-10-CM | POA: Diagnosis not present

## 2024-06-24 DIAGNOSIS — H2513 Age-related nuclear cataract, bilateral: Secondary | ICD-10-CM | POA: Diagnosis not present

## 2024-06-24 DIAGNOSIS — H353132 Nonexudative age-related macular degeneration, bilateral, intermediate dry stage: Secondary | ICD-10-CM | POA: Diagnosis not present

## 2024-06-24 DIAGNOSIS — H35443 Age-related reticular degeneration of retina, bilateral: Secondary | ICD-10-CM | POA: Diagnosis not present

## 2024-06-25 DIAGNOSIS — R070 Pain in throat: Secondary | ICD-10-CM | POA: Diagnosis not present

## 2024-07-04 DIAGNOSIS — H353132 Nonexudative age-related macular degeneration, bilateral, intermediate dry stage: Secondary | ICD-10-CM | POA: Diagnosis not present

## 2024-07-20 ENCOUNTER — Telehealth: Payer: Self-pay | Admitting: Family Medicine

## 2024-07-20 DIAGNOSIS — G43709 Chronic migraine without aura, not intractable, without status migrainosus: Secondary | ICD-10-CM

## 2024-07-20 NOTE — Telephone Encounter (Signed)
 Submitted auth request via CMM, status is pending. If pt is approved, she will transition to [facility]. Key: [employer]

## 2024-07-20 NOTE — Telephone Encounter (Signed)
 Received approval, please send rx to [facility].  Auth#: [id number] (07/20/24-10/20/24)

## 2024-07-26 MED ORDER — ONABOTULINUMTOXINA 200 UNITS IJ SOLR: INTRAMUSCULAR | 2 refills | Status: AC

## 2024-07-26 NOTE — Addendum Note (Signed)
 Addended by: JOSHUA MAURILIO CROME on: 07/26/2024 07:55 AM   Modules accepted: Orders

## 2024-08-03 DIAGNOSIS — H353132 Nonexudative age-related macular degeneration, bilateral, intermediate dry stage: Secondary | ICD-10-CM | POA: Diagnosis not present

## 2024-08-11 NOTE — Progress Notes (Signed)
 08/15/2024 ALL: Christina [name] returns for Botox . She continues atenolol  100mg  and Qulipta  60mg  daily and naratriptan  or Ubrelvy  as needed. She has Nurtec as well but not as effective. Last migraine 05/2024.   05/18/2024 ALL: Christina [name] returns for Botox . She continues atenolol  100mg  and  Qulipta  60mg  daily and naratriptan  or Nurtec as needed. She is doing great. Last migraine 02/2024. Milder headaches easily aborted with Tylenol . Lost sister recently.   02/15/2024 ALL: Christina [name] returns for Botox . She reports migraines have been well managed since last visit. She had 2 migraines in January and 1 in Feb. None in March. She added magnesium to Co Q10 and feels this has helped. She stocked up on naratriptan  and Nurtec but hasn't needed them recently.   11/23/2023 ALL: Christina [name] returns for Botox . She was given steroid taper two weeks ago for intractable migraine but otherwise doing well. She continues atenolol  100mg  and Qulipta  60mg  daily. Averaging 1 headache per month until last two weeks prior to next Botox  then nearly daily headaches. Nurtec and naratriptan  usually help.   08/26/2023 ALL: Christina [name] returns for Botox . She is doing well. She continues ateolol and Qulipta . She averages about 5-10 headache days over the past 3 months with 2-3 migraine days a month. She usually takes Nurtec and naratriptan  with Tylenol . Using about 4-5 tablets of these per month. Occasionally uses phenergan  for nausea. May take 2-3 tablets a month at most.   06/02/2023 ALL: Christina [name] returns for Botox . She continues to do well. She continues Qulipta  60mg  and atenolol  100mg  daily. Nurtec and naratriptan  alternated for abortive therapy. She is doing well. She averages 2-3 headache days a month with 1-2 migrainous headaches.   03/03/2023 ALL: Christina [name] returns for Botox . She continues Qulipta  60mg  and atenolol  100mg  daily. Nurtec and naratriptan  alternated for abortive therapy. She is doing well. She averages 2-3 headache days a month with 1-2  migrainous headaches.   12/09/2022 ALL: Christina [name] returns for Botox . She continues Qulipta  60mg  daily and atenolol  100mg  daily. Nurtec and naratriptan  used for abortive therapy. She reports headaches were worse for about two weeks after last visit but since have been well managed. She has not had breakthrough headaches like normal.   09/15/2022 ALL: Christina [name] returns for Botox . She continues Qulipta  and atenolol  (recently increased to 100mg ). Nurtec and naratripan alternated for abortive therapy. She reports headaches are fairly stable. Rare migraines for first 8-9 weeks. She does have daily migraines for the last two weeks before next procedure. She uses abortive meds QOD for the last two weeks. Prednisone  taper helps as well. Last taper 09/03/2022.   06/17/2022 ALL: Christina [name] returns for Botox . We increased atenolol  to 75mg  in 04/2022 after headaches worsened. She continues Qulipta  daily. Nurtec and naratritpan works best for abortive therapy. Trudesha not covered.   03/20/2022 ALL: Christina [name] returns for Botox . We increased atenolol  to 50mg  daily and continued Qulipta . She reports daily headaches are significantly better. She may have 1 headache per week. She may have 2 migraines on average per month. She uses either Nurtec, ubrelvy  or naratriptan  for abortive therapy.   12/19/2021 ALL: She returns for Botox . She was seen in follow up with Dr Ines via Mychart 12/10/2021 and started on atenolol  12.5mg  daily. She has failed multiple oral and injection preventatives. She continues Qulipta . She feels that she is doing a little better. She did have a migraine yesterday thought to be related to the weather. Dr Ines gave her Ubrelvy  16 tabs per month. She also has Nurtec and naratriptan  which  help with abortive therapy. She is aware not to take more than prescribed dose and to try to avoid regular use of abortive agents.   09/16/2021 ALL: She returns for Botox . She is having more frequent and intense migraines. Tummy  tuck over the summer then DVTs. Hemoglobin dropped and had GI eval. More stress. About 4-5 migraines a month but some toward end of Emgality /Botox  cycle are severe and intractable. Medrol  dose pack did not help much last week. Nurtec and naratriptan  do help some. Tried and failed topiramate , nortriptyline, propranolol, metoprolol, Amovig, Emgality , Ajovy , gabapentin , sumatriptan , rizatriptan , Botox . We will try Qulipta  60mg  daily.  06/12/2021 ALL: She returns for Botox  procedure. She had difficulty with an intractable migraines that lasted for over a week. It was finally aborted with migraine cocktail and steroid taper. She usually has about 1 migraine per month that is easily aborted with Nurtec. She is feeling well, today.   02/27/2021 ALL: She continues to do well on Botox . Greater than 50% reduction in migraines. Emgality  and rizatriptan  working well.   11/29/2020 ALL: She continues to do well. She continues Botox  and Emgality . Rizatriptan  works well for abortive therapy. Average 1 migraine per month.    Consent Form Botulism Toxin Injection For Chronic Migraine    Reviewed orally with patient, additionally signature is on file:  Botulism toxin has been approved by the Federal drug administration for treatment of chronic migraine. Botulism toxin does not cure chronic migraine and it may not be effective in some patients.  The administration of botulism toxin is accomplished by injecting a small amount of toxin into the muscles of the neck and head. Dosage must be titrated for each individual. Any benefits resulting from botulism toxin tend to wear off after 3 months with a repeat injection required if benefit is to be maintained. Injections are usually done every 3-4 months with maximum effect peak achieved by about 2 or 3 weeks. Botulism toxin is expensive and you should be sure of what costs you will incur resulting from the injection.  The side effects of botulism toxin use for chronic migraine  may include:   -Transient, and usually mild, facial weakness with facial injections  -Transient, and usually mild, head or neck weakness with head/neck injections  -Reduction or loss of forehead facial animation due to forehead muscle weakness  -Eyelid drooping  -Dry eye  -Pain at the site of injection or bruising at the site of injection  -Double vision  -Potential unknown long term risks   Contraindications: You should not have Botox  if you are pregnant, nursing, allergic to albumin, have an infection, skin condition, or muscle weakness at the site of the injection, or have myasthenia gravis, Lambert-Eaton syndrome, or ALS.  It is also possible that as with any injection, there may be an allergic reaction or no effect from the medication. Reduced effectiveness after repeated injections is sometimes seen and rarely infection at the injection site may occur. All care will be taken to prevent these side effects. If therapy is given over a long time, atrophy and wasting in the muscle injected may occur. Occasionally the patient's become refractory to treatment because they develop antibodies to the toxin. In this event, therapy needs to be modified.  I have read the above information and consent to the administration of botulism toxin.    BOTOX  PROCEDURE NOTE FOR MIGRAINE HEADACHE  Contraindications and precautions discussed with patient(above). Aseptic procedure was observed and patient tolerated procedure. Procedure performed by Greig Forbes, FNP-C.  The condition has existed for more than 6 months, and pt does not have a diagnosis of ALS, Myasthenia Gravis or Lambert-Eaton Syndrome.  Risks and benefits of injections discussed and pt agrees to proceed with the procedure.  Written consent obtained  These injections are medically necessary. Pt  receives good benefits from these injections. These injections do not cause sedations or hallucinations which the oral therapies may  cause.   Description of procedure:  The patient was placed in a sitting position. The standard protocol was used for Botox  as follows, with 5 units of Botox  injected at each site:  -Procerus muscle, midline injection  -Corrugator muscle, bilateral injection  -Frontalis muscle, bilateral injection, with 2 sites each side, medial injection was performed in the upper one third of the frontalis muscle, in the region vertical from the medial inferior edge of the superior orbital rim. The lateral injection was again in the upper one third of the forehead vertically above the lateral limbus of the cornea, 1.5 cm lateral to the medial injection site.  -Temporalis muscle injection, 4 sites, bilaterally. The first injection was 3 cm above the tragus of the ear, second injection site was 1.5 cm to 3 cm up from the first injection site in line with the tragus of the ear. The third injection site was 1.5-3 cm forward between the first 2 injection sites. The fourth injection site was 1.5 cm posterior to the second injection site. 5th site laterally in the temporalis  muscleat the level of the outer canthus.  -Occipitalis muscle injection, 5 sites, bilaterally. The first injection was done one half way between the occipital protuberance and the tip of the mastoid process behind the ear. The second injection site was done lateral and superior to the first, 1 fingerbreadth from the first injection. The third injection site was 1 fingerbreadth superiorly and medially from the first injection site.  -Cervical paraspinal muscle injection, 2 sites, bilaterally. The first injection site was 1 cm from the midline of the cervical spine, 3 cm inferior to the lower border of the occipital protuberance. The second injection site was 1.5 cm superiorly and laterally to the first injection site.  -Trapezius muscle injection was performed at 3 sites, bilaterally. The first injection site was in the upper trapezius muscle  halfway between the inflection point of the neck, and the acromion. The second injection site was one half way between the acromion and the first injection site. The third injection was done between the first injection site and the inflection point of the neck.   Will return for repeat injection in 3 months.   A total of 200 units of Botox  was prepared, 155 units of Botox  was injected as documented above, 45 units of Botox  was wasted. The patient tolerated the procedure well, there were no complications of the above procedure.

## 2024-08-15 ENCOUNTER — Ambulatory Visit: Admitting: Family Medicine

## 2024-08-15 VITALS — BP 119/85 | HR 66

## 2024-08-15 DIAGNOSIS — G43709 Chronic migraine without aura, not intractable, without status migrainosus: Secondary | ICD-10-CM | POA: Diagnosis not present

## 2024-08-15 MED ORDER — ONABOTULINUMTOXINA 200 UNITS IJ SOLR
155.0000 [IU] | Freq: Once | INTRAMUSCULAR | Status: AC
  Administered 2024-08-15: 155 [IU] via INTRAMUSCULAR

## 2024-08-15 MED ORDER — UBRELVY 100 MG PO TABS
100.0000 mg | ORAL_TABLET | Freq: Every day | ORAL | 11 refills | Status: AC | PRN
Start: 2024-08-15 — End: ?

## 2024-08-15 NOTE — Progress Notes (Signed)
 Botox - 200 units x 1 vial Lot: [id number] Expiration: 10/2026 NDC: 9976-6078-97  Bacteriostatic 0.9% Sodium Chloride - 4 mL  Lot: [id number] Expiration: 09/23/25 NDC: 9590-8033+97  Dx: H56.290 S/P  Witnessed by Briant RAMAN. CMA

## 2024-08-22 DIAGNOSIS — J029 Acute pharyngitis, unspecified: Secondary | ICD-10-CM | POA: Diagnosis not present

## 2024-08-22 DIAGNOSIS — M2420 Disorder of ligament, unspecified site: Secondary | ICD-10-CM | POA: Diagnosis not present

## 2024-08-23 ENCOUNTER — Other Ambulatory Visit (HOSPITAL_COMMUNITY): Payer: Self-pay

## 2024-08-23 ENCOUNTER — Telehealth: Payer: Self-pay

## 2024-08-23 NOTE — Telephone Encounter (Signed)
 Pharmacy Patient Advocate Encounter   Received notification from CoverMyMeds that prior authorization for Ubrelvy   is required/requested.   Insurance verification completed.   The patient is insured through [facility].   Per test claim: PA required; PA submitted to above mentioned insurance via Latent Key/confirmation #/EOC [id number] Status is pending

## 2024-08-24 ENCOUNTER — Telehealth: Payer: Self-pay

## 2024-08-24 NOTE — Telephone Encounter (Signed)
   Pre-operative Risk Assessment    Patient Name: Christina [name]  DOB: 1951-05-04 MRN: [id number]   Date of last office visit: 02/17/24 WILL CAMNITZ, MD Date of next office visit: NONE   Request for Surgical Clearance    Procedure:  LT INTRAORAL STYLOIDECTOMY  Date of Surgery:  Clearance TBD                                Surgeon:  DR CARLIE Surgeon's Group or Practice Name:  [facility] -  Phone number:  [phone] Fax number:  [phone]   Type of Clearance Requested:   - Medical  - Pharmacy:  Hold Aspirin and Apixaban  (Eliquis )     Type of Anesthesia:  Not Indicated   Additional requests/questions:    Signed, Lucie DELENA Ku   08/24/2024, 4:35 PM

## 2024-08-24 NOTE — Telephone Encounter (Signed)
 Recent metabolic panel but last CBC 08/31/23, do you require repeat?

## 2024-08-24 NOTE — Telephone Encounter (Signed)
 Pharmacy Patient Advocate Encounter  Received notification from [employer] that Prior Authorization for Ubrelvy  has been APPROVED from 08/23/2024 to 11/23/2024   PA #/Case ID/Reference #: [id number]

## 2024-08-26 DIAGNOSIS — H35443 Age-related reticular degeneration of retina, bilateral: Secondary | ICD-10-CM | POA: Diagnosis not present

## 2024-08-26 DIAGNOSIS — H2513 Age-related nuclear cataract, bilateral: Secondary | ICD-10-CM | POA: Diagnosis not present

## 2024-08-26 DIAGNOSIS — H353132 Nonexudative age-related macular degeneration, bilateral, intermediate dry stage: Secondary | ICD-10-CM | POA: Diagnosis not present

## 2024-08-26 DIAGNOSIS — H43823 Vitreomacular adhesion, bilateral: Secondary | ICD-10-CM | POA: Diagnosis not present

## 2024-08-29 NOTE — Telephone Encounter (Signed)
 Patient with diagnosis of afib/DVT/PE on Eliquis  for anticoagulation.    Procedure: LT INTRAORAL STYLOIDECTOMY  Date of procedure: TBD   CHA2DS2-VASc Score = 3   This indicates a 3.2% annual risk of stroke. The patient's score is based upon: CHF History: 0 HTN History: 1 Diabetes History: 0 Stroke History: 0 Vascular Disease History: 0 Age Score: 1 Gender Score: 1      CrCl 46 ml/min Platelet count 213  Patient has not had an Afib/aflutter ablation in the last 3 months, DCCV within the last 4 weeks or a watchman implanted in the last 45 days   Hx of DVT/PE in 2022 post surgery  Per office protocol, patient can hold Eliquis  for 2 days prior to procedure.    **This guidance is not considered finalized until pre-operative APP has relayed final recommendations.**

## 2024-08-30 ENCOUNTER — Telehealth: Payer: Self-pay

## 2024-08-30 NOTE — Telephone Encounter (Signed)
   Name: Christina [name]  DOB: 02/24/1951  MRN: [id number]  Primary Cardiologist: None   Preoperative team, please contact this patient and set up a phone call appointment for further preoperative risk assessment. Please obtain consent and complete medication review. Thank you for your help.  I confirm that guidance regarding antiplatelet and oral anticoagulation therapy has been completed and, if necessary, noted below.  Patient has not had an Afib/aflutter ablation in the last 3 months, DCCV within the last 4 weeks or a watchman implanted in the last 45 days    Hx of DVT/PE in 2022 post surgery   Per office protocol, patient can hold Eliquis  for 2 days prior to procedure.    I also confirmed the patient resides in the state of [place] . As per [facility] Medical Board telemedicine laws, the patient must reside in the state in which the provider is licensed.    Barnie Hila, NP 08/30/2024, 8:03 AM [facility]

## 2024-08-30 NOTE — Telephone Encounter (Signed)
 Appointment scheduled for 09/01/2024 @ 2:40pm. Med req and consent are are complete.

## 2024-08-30 NOTE — Telephone Encounter (Signed)
  Patient Consent for Virtual Visit         Christina [name] has provided verbal consent on 08/30/2024 for a virtual visit (video or telephone).   CONSENT FOR VIRTUAL VISIT FOR:  Christina [name]  By participating in this virtual visit I agree to the following:  I hereby voluntarily request, consent and authorize [facility] and its employed or contracted physicians, physician assistants, nurse practitioners or other licensed health care professionals (the Practitioner), to provide me with telemedicine health care services (the "Services) as deemed necessary by the treating Practitioner. I acknowledge and consent to receive the Services by the Practitioner via telemedicine. I understand that the telemedicine visit will involve communicating with the Practitioner through live audiovisual communication technology and the disclosure of certain medical information by electronic transmission. I acknowledge that I have been given the opportunity to request an in-person assessment or other available alternative prior to the telemedicine visit and am voluntarily participating in the telemedicine visit.  I understand that I have the right to withhold or withdraw my consent to the use of telemedicine in the course of my care at any time, without affecting my right to future care or treatment, and that the Practitioner or I may terminate the telemedicine visit at any time. I understand that I have the right to inspect all information obtained and/or recorded in the course of the telemedicine visit and may receive copies of available information for a reasonable fee.  I understand that some of the potential risks of receiving the Services via telemedicine include:  Delay or interruption in medical evaluation due to technological equipment failure or disruption; Information transmitted may not be sufficient (e.g. poor resolution of images) to allow for appropriate medical decision making by the  Practitioner; and/or  In rare instances, security protocols could fail, causing a breach of personal health information.  Furthermore, I acknowledge that it is my responsibility to provide information about my medical history, conditions and care that is complete and accurate to the best of my ability. I acknowledge that Practitioner's advice, recommendations, and/or decision may be based on factors not within their control, such as incomplete or inaccurate data provided by me or distortions of diagnostic images or specimens that may result from electronic transmissions. I understand that the practice of medicine is not an exact science and that Practitioner makes no warranties or guarantees regarding treatment outcomes. I acknowledge that a copy of this consent can be made available to me via my patient portal [facility] MyChart), or I can request a printed copy by calling the office of [facility].    I understand that my insurance will be billed for this visit.   I have read or had this consent read to me. I understand the contents of this consent, which adequately explains the benefits and risks of the Services being provided via telemedicine.  I have been provided ample opportunity to ask questions regarding this consent and the Services and have had my questions answered to my satisfaction. I give my informed consent for the services to be provided through the use of telemedicine in my medical care

## 2024-08-31 DIAGNOSIS — Z23 Encounter for immunization: Secondary | ICD-10-CM | POA: Diagnosis not present

## 2024-09-01 ENCOUNTER — Ambulatory Visit: Attending: Cardiovascular Disease

## 2024-09-01 DIAGNOSIS — Z01810 Encounter for preprocedural cardiovascular examination: Secondary | ICD-10-CM | POA: Diagnosis not present
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
## 2024-09-02 DIAGNOSIS — H353132 Nonexudative age-related macular degeneration, bilateral, intermediate dry stage: Secondary | ICD-10-CM | POA: Diagnosis not present

## 2024-09-05 ENCOUNTER — Other Ambulatory Visit: Payer: Self-pay | Admitting: Otolaryngology

## 2024-09-05 NOTE — Telephone Encounter (Signed)
 Christina [name] with [facility] called in stating they received clearance but they need it to include the aspirin hold as well.

## 2024-09-07 ENCOUNTER — Encounter (HOSPITAL_COMMUNITY): Payer: Self-pay | Admitting: Otolaryngology

## 2024-09-07 ENCOUNTER — Other Ambulatory Visit: Payer: Self-pay

## 2024-09-07 NOTE — Progress Notes (Signed)
 PCP - Charmaine Bright, PA-C Cardiologist - Josefa Beauvais, NP (clearance 09/01/24) EP - Dr Inocencio (last ov 02/17/24)  Chest x-ray - n/a EKG - 02/17/24 Stress Test - n/a ECHO - 06/27/21 Cardiac Cath - n/a  Loop recorder removed on 06/04/22.  Sleep Study -  n/a  Diabetes - n/a  Eliquis  Instructions:  Hold Eliquis  2 days prior to procedure.  Last dose was on 09/06/24.  Aspirin Instructions: Hold ASA 5-7 days prior to procedure.  Last dose was on 09/07/24 -SDW Call.   ERAS - clear liquids til 6:45 AM DOS.  Anesthesia review: Yes  STOP now taking any Aleve, Naproxen, Ibuprofen, Motrin, Advil, Goody's, BC's, all herbal medications, fish oil, and all vitamins.   Coronavirus Screening Do you have any of the following symptoms:  Cough yes/no: No Fever (>100.21F)  yes/no: No Runny nose yes/no: No Sore throat yes/no: Yes Difficulty breathing/shortness of breath  yes/no: No  Have you traveled in the last 14 days and where? yes/no: No  Patient verbalized understanding of instructions that were given via phone.

## 2024-09-08 ENCOUNTER — Encounter (HOSPITAL_COMMUNITY): Payer: Self-pay | Admitting: Otolaryngology
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
## 2024-09-08 NOTE — Anesthesia Preprocedure Evaluation (Addendum)
 Anesthesia Evaluation  Patient identified by MRN, date of birth, ID band Patient awake    Reviewed: Allergy  & Precautions, NPO status , Patient's Chart, lab work & pertinent test results, reviewed documented beta blocker date and time   History of Anesthesia Complications (+) PONV and history of anesthetic complications  Airway Mallampati: III  TM Distance: >3 FB Neck ROM: Full    Dental no notable dental hx. (+) Teeth Intact, Dental Advisory Given   Pulmonary PE   Pulmonary exam normal breath sounds clear to auscultation       Cardiovascular hypertension, Pt. on home beta blockers and Pt. on medications + DVT  Normal cardiovascular exam+ dysrhythmias Atrial Fibrillation  Rhythm:Regular Rate:Normal  TTE 20221. Left ventricular ejection fraction, by estimation, is 55 to 60%. The  left ventricle has normal function. The left ventricle has no regional  wall motion abnormalities. Left ventricular diastolic parameters were  normal.   2. Right ventricular systolic function is normal. The right ventricular  size is normal. There is normal pulmonary artery systolic pressure. The  estimated right ventricular systolic pressure is 26.8 mmHg.   3. Left atrial size was mildly dilated.   4. The mitral valve is normal in structure. Mild mitral valve  regurgitation. No evidence of mitral stenosis.   5. The aortic valve is tricuspid. Aortic valve regurgitation is not  visualized. No aortic stenosis is present.   6. Aortic dilatation noted. There is mild dilatation of the ascending  aorta, measuring 38 mm.   7. The inferior vena cava is normal in size with greater than 50%  respiratory variability, suggesting right atrial pressure of 3 mmHg.     Neuro/Psych  Headaches  negative psych ROS   GI/Hepatic Neg liver ROS,GERD  ,,  Endo/Other  negative endocrine ROS    Renal/GU negative Renal ROS  negative genitourinary    Musculoskeletal  (+) Arthritis ,    Abdominal   Peds  Hematology  (+) Blood dyscrasia (eliquis )   Anesthesia Other Findings   Reproductive/Obstetrics                              Anesthesia Physical Anesthesia Plan  ASA: 3  Anesthesia Plan: General   Post-op Pain Management:    Induction: Intravenous  PONV Risk Score and Plan: 4 or greater and Dexamethasone , Ondansetron , TIVA and Treatment may vary due to age or medical condition  Airway Management Planned: Oral ETT and Video Laryngoscope Planned  Additional Equipment:   Intra-op Plan:   Post-operative Plan: Extubation in OR  Informed Consent: I have reviewed the patients History and Physical, chart, labs and discussed the procedure including the risks, benefits and alternatives for the proposed anesthesia with the patient or authorized representative who has indicated his/her understanding and acceptance.     Dental advisory given  Plan Discussed with: CRNA  Anesthesia Plan Comments: (PAT note written 09/08/2024 by Allison Zelenak, PA-C.  She was intubated by ENT Dr. Joshua Waltonen for direct laryngoscopy with CO2 laser excision of left pharyngeal lesion on 10/05/2023. Her wrote: Easy mask ventilation with yellow oral airway and jaw thrust Difficult exposure (Grade 3 view) with Mac 3 laryngoscope and anterior pressure (small mouth opening and retrusive mandible) )         Anesthesia Quick Evaluation

## 2024-09-09 ENCOUNTER — Encounter (HOSPITAL_COMMUNITY)
Admission: RE | Disposition: A | Discharge status: Home / Self Care | Payer: Self-pay | Source: Home / Self Care | Attending: Otolaryngology

## 2024-09-09 ENCOUNTER — Ambulatory Visit (HOSPITAL_COMMUNITY): Payer: Self-pay | Admitting: Vascular Surgery

## 2024-09-09 ENCOUNTER — Encounter (HOSPITAL_COMMUNITY): Payer: Self-pay | Admitting: Otolaryngology

## 2024-09-09 ENCOUNTER — Ambulatory Visit (HOSPITAL_COMMUNITY)
Admission: RE | Admit: 2024-09-09 | Discharge: 2024-09-09 | Disposition: A | Discharge status: Home / Self Care | Attending: Otolaryngology | Admitting: Otolaryngology

## 2024-09-09 ENCOUNTER — Other Ambulatory Visit: Payer: Self-pay

## 2024-09-09 DIAGNOSIS — J029 Acute pharyngitis, unspecified: Secondary | ICD-10-CM | POA: Diagnosis not present

## 2024-09-09 DIAGNOSIS — Z7901 Long term (current) use of anticoagulants: Secondary | ICD-10-CM | POA: Insufficient documentation

## 2024-09-09 DIAGNOSIS — I10 Essential (primary) hypertension: Secondary | ICD-10-CM

## 2024-09-09 DIAGNOSIS — M2420 Disorder of ligament, unspecified site: Secondary | ICD-10-CM | POA: Diagnosis not present

## 2024-09-09 DIAGNOSIS — Z86718 Personal history of other venous thrombosis and embolism: Secondary | ICD-10-CM | POA: Insufficient documentation

## 2024-09-09 DIAGNOSIS — M8588 Other specified disorders of bone density and structure, other site: Secondary | ICD-10-CM | POA: Diagnosis not present

## 2024-09-09 HISTORY — DX: Unspecified osteoarthritis, unspecified site: M19.90

## 2024-09-09 HISTORY — DX: Gastro-esophageal reflux disease without esophagitis: K21.9

## 2024-09-09 LAB — CBC
HCT: 38.5 % (ref 36.0–46.0)
Hemoglobin: 12.6 g/dL (ref 12.0–15.0)
MCH: 30.1 pg (ref 26.0–34.0)
MCHC: 32.7 g/dL (ref 30.0–36.0)
MCV: 92.1 fL (ref 80.0–100.0)
Platelets: 218 K/uL (ref 150–400)
RBC: 4.18 MIL/uL (ref 3.87–5.11)
RDW: 13.3 % (ref 11.5–15.5)
WBC: 8.7 K/uL (ref 4.0–10.5)
nRBC: 0 % (ref 0.0–0.2)

## 2024-09-09 LAB — BASIC METABOLIC PANEL WITH GFR
Anion gap: 15 (ref 5–15)
BUN: 19 mg/dL (ref 8–23)
CO2: 19 mmol/L — ABNORMAL LOW (ref 22–32)
Calcium: 8.6 mg/dL — ABNORMAL LOW (ref 8.9–10.3)
Chloride: 105 mmol/L (ref 98–111)
Creatinine, Ser: 0.86 mg/dL (ref 0.44–1.00)
GFR, Estimated: >60 mL/min (ref >60)
Glucose, Bld: 93 mg/dL (ref 70–99)
Potassium: 4.1 mmol/L (ref 3.5–5.1)
Sodium: 139 mmol/L (ref 135–145)

## 2024-09-09 SURGERY — EXCISION, STYLOID PROCESS, TEMPORAL BONE
Anesthesia: General | Laterality: Left

## 2024-09-09 MED ORDER — LIDOCAINE-EPINEPHRINE 1 %-1:100000 IJ SOLN
INTRAMUSCULAR | Status: DC | PRN
  Administered 2024-09-09: 3 mL

## 2024-09-09 MED ORDER — FENTANYL CITRATE (PF) 100 MCG/2ML IJ SOLN
INTRAMUSCULAR | Status: AC
  Filled 2024-09-09: qty 2

## 2024-09-09 MED ORDER — LIDOCAINE 2% (20 MG/ML) 5 ML SYRINGE
INTRAMUSCULAR | Status: AC
  Filled 2024-09-09: qty 5

## 2024-09-09 MED ORDER — ONDANSETRON HCL 4 MG/2ML IJ SOLN
INTRAMUSCULAR | Status: DC | PRN
  Administered 2024-09-09: 4 mg via INTRAVENOUS

## 2024-09-09 MED ORDER — PROPOFOL 10 MG/ML IV BOLUS
INTRAVENOUS | Status: DC | PRN
  Administered 2024-09-09: 130 mg via INTRAVENOUS
  Administered 2024-09-09: 125 ug/kg/min via INTRAVENOUS

## 2024-09-09 MED ORDER — SUGAMMADEX SODIUM 200 MG/2ML IV SOLN
INTRAVENOUS | Status: DC | PRN
  Administered 2024-09-09: 200 mg via INTRAVENOUS

## 2024-09-09 MED ORDER — HYDROCODONE-ACETAMINOPHEN 7.5-325 MG/15ML PO SOLN: 15.0000 mL | Freq: Four times a day (QID) | ORAL | 0 refills | Status: AC | PRN

## 2024-09-09 MED ORDER — OXYCODONE HCL 5 MG/5ML PO SOLN: 5.0000 mg | Freq: Once | ORAL | Status: DC | PRN

## 2024-09-09 MED ORDER — CHLORHEXIDINE GLUCONATE 0.12 % MT SOLN
15.0000 mL | Freq: Once | OROMUCOSAL | Status: AC
  Administered 2024-09-09: 15 mL via OROMUCOSAL
  Filled 2024-09-09: qty 15

## 2024-09-09 MED ORDER — PROPOFOL 10 MG/ML IV BOLUS
INTRAVENOUS | Status: AC
  Filled 2024-09-09: qty 20

## 2024-09-09 MED ORDER — ROCURONIUM BROMIDE 10 MG/ML (PF) SYRINGE
PREFILLED_SYRINGE | INTRAVENOUS | Status: AC
  Filled 2024-09-09: qty 10

## 2024-09-09 MED ORDER — ROCURONIUM BROMIDE 10 MG/ML (PF) SYRINGE
PREFILLED_SYRINGE | INTRAVENOUS | Status: DC | PRN
  Administered 2024-09-09: 50 mg via INTRAVENOUS

## 2024-09-09 MED ORDER — OXYCODONE HCL 5 MG PO TABS: 5.0000 mg | ORAL_TABLET | Freq: Once | ORAL | Status: DC | PRN

## 2024-09-09 MED ORDER — LIDOCAINE 2% (20 MG/ML) 5 ML SYRINGE
INTRAMUSCULAR | Status: DC | PRN
  Administered 2024-09-09: 60 mg via INTRAVENOUS

## 2024-09-09 MED ORDER — FENTANYL CITRATE (PF) 100 MCG/2ML IJ SOLN: 25.0000 ug | INTRAMUSCULAR | Status: DC | PRN

## 2024-09-09 MED ORDER — FENTANYL CITRATE (PF) 250 MCG/5ML IJ SOLN
INTRAMUSCULAR | Status: DC | PRN
  Administered 2024-09-09: 50 ug via INTRAVENOUS

## 2024-09-09 MED ORDER — DEXAMETHASONE SOD PHOSPHATE PF 10 MG/ML IJ SOLN
INTRAMUSCULAR | Status: DC | PRN
  Administered 2024-09-09: 10 mg via INTRAVENOUS

## 2024-09-09 MED ORDER — KETOROLAC TROMETHAMINE 30 MG/ML IJ SOLN
INTRAMUSCULAR | Status: AC
  Filled 2024-09-09: qty 1

## 2024-09-09 MED ORDER — 0.9 % SODIUM CHLORIDE (POUR BTL) OPTIME
TOPICAL | Status: DC | PRN
  Administered 2024-09-09: 1000 mL

## 2024-09-09 MED ORDER — ORAL CARE MOUTH RINSE: 15.0000 mL | Freq: Once | OROMUCOSAL | Status: AC

## 2024-09-09 MED ORDER — AMISULPRIDE (ANTIEMETIC) 5 MG/2ML IV SOLN: 10.0000 mg | Freq: Once | INTRAVENOUS | Status: DC | PRN

## 2024-09-09 MED FILL — Ondansetron HCl Inj 4 MG/2ML (2 MG/ML): INTRAMUSCULAR | Qty: 2 | Status: AC

## 2024-09-09 MED FILL — Propofol IV Emul 200 MG/20ML (10 MG/ML): INTRAVENOUS | Qty: 20 | Status: AC

## 2024-09-09 MED FILL — Lidocaine Inj 1% w/ Epinephrine-1:100000: INTRAMUSCULAR | Qty: 1 | Status: AC

## 2024-09-09 SURGICAL SUPPLY — 59 items
BAG COUNTER SPONGE SURGICOUNT (BAG) ×1 IMPLANT
BAG DECANTER FOR FLEXI CONT (MISCELLANEOUS) ×1 IMPLANT
BLADE CLIPPER SURG (BLADE) ×1 IMPLANT
BLADE SURG 15 STRL LF DISP TIS (BLADE) IMPLANT
BNDG GAUZE DERMACEA FLUFF 4 (GAUZE/BANDAGES/DRESSINGS) IMPLANT
CANISTER SUCTION 3000ML PPV (SUCTIONS) ×1 IMPLANT
CLEANER TIP ELECTROSURG 2X2 (MISCELLANEOUS) ×1 IMPLANT
CNTNR URN SCR LID CUP LEK RST (MISCELLANEOUS) ×1 IMPLANT
COVER SURGICAL LIGHT HANDLE (MISCELLANEOUS) ×1 IMPLANT
DRAIN CHANNEL 10F 3/8 F FF (DRAIN) IMPLANT
DRAIN SNY 7 FPER (WOUND CARE) IMPLANT
DRAPE HALF SHEET 40X57 (DRAPES) IMPLANT
ELECT COATED BLADE 2.86 ST (ELECTRODE) ×1 IMPLANT
ELECT NDL TIP 2.8 STRL (NEEDLE) IMPLANT
ELECT NEEDLE TIP 2.8 STRL (NEEDLE) IMPLANT
ELECTRODE PAIRED SUBDERMAL (MISCELLANEOUS) IMPLANT
ELECTRODE REM PT RTRN 9FT ADLT (ELECTROSURGICAL) ×1 IMPLANT
EVACUATOR SILICONE 100CC (DRAIN) ×1 IMPLANT
GAUZE 4X4 16PLY ~~LOC~~+RFID DBL (SPONGE) ×1 IMPLANT
GLOVE BIO SURGEON STRL SZ7.5 (GLOVE) ×2 IMPLANT
GOWN STRL REUS W/ TWL LRG LVL3 (GOWN DISPOSABLE) ×2 IMPLANT
KIT BASIN OR (CUSTOM PROCEDURE TRAY) ×1 IMPLANT
KIT TURNOVER KIT B (KITS) ×1 IMPLANT
LOCATOR NERVE 3 VOLT (DISPOSABLE) ×1 IMPLANT
NDL HYPO 25GX1X1/2 BEV (NEEDLE) IMPLANT
NEEDLE HYPO 25GX1X1/2 BEV (NEEDLE) IMPLANT
PAD ARMBOARD POSITIONER FOAM (MISCELLANEOUS) ×2 IMPLANT
PAD MAGNETIC INSTR ST 16X20 (MISCELLANEOUS) IMPLANT
PENCIL SMOKE EVACUATOR (MISCELLANEOUS) ×1 IMPLANT
POSITIONER HEAD DONUT 9IN (MISCELLANEOUS) IMPLANT
PROBE NERVBE PRASS .33 (MISCELLANEOUS) IMPLANT
ROLLS DENTAL (MISCELLANEOUS) IMPLANT
SOLN 0.9% NACL 1000 ML (IV SOLUTION) ×1 IMPLANT
SOLN 0.9% NACL POUR BTL 1000ML (IV SOLUTION) ×1 IMPLANT
SOLN STERILE WATER 1000 ML (IV SOLUTION) ×1 IMPLANT
SOLN STERILE WATER BTL 1000 ML (IV SOLUTION) ×1 IMPLANT
SPECIMEN JAR MEDIUM (MISCELLANEOUS) ×1 IMPLANT
SPONGE INTESTINAL PEANUT (DISPOSABLE) IMPLANT
SPONGE T-LAP 18X18 ~~LOC~~+RFID (SPONGE) ×1 IMPLANT
STAPLER SKIN PROX 35W (STAPLE) ×1 IMPLANT
SURGILUBE 2OZ TUBE FLIPTOP (MISCELLANEOUS) IMPLANT
SUT ETHILON 3 0 PS 1 (SUTURE) IMPLANT
SUT SILK 2 0 PERMA HAND 18 BK (SUTURE) ×1 IMPLANT
SUT SILK 2 0 REEL (SUTURE) IMPLANT
SUT SILK 3 0 REEL (SUTURE) ×2 IMPLANT
SUT SILK 4 0 REEL (SUTURE) IMPLANT
SUT VIC AB 3-0 SH 27X BRD (SUTURE) ×2 IMPLANT
SUT VIC AB 4-0 SH 27XBRD (SUTURE) IMPLANT
SYR 5ML LL (SYRINGE) IMPLANT
SYR BULB EAR ULCER 3OZ GRN STR (SYRINGE) ×1 IMPLANT
TOWEL GREEN STERILE FF (TOWEL DISPOSABLE) ×1 IMPLANT
TRAY ENT MC OR (CUSTOM PROCEDURE TRAY) ×1 IMPLANT
TRAY FOLEY MTR SLVR 14FR STAT (SET/KITS/TRAYS/PACK) IMPLANT
TUBE 10FR 43IN 5G WT TIP ENFIT (TUBING) IMPLANT
TUBE CONNECTING 12X1/4 (SUCTIONS) ×1 IMPLANT
TUBE ENDOTRAC EMG 7X10.2 (MISCELLANEOUS) IMPLANT
TUBE ENDOTRAC EMG 8X11.3 (MISCELLANEOUS) IMPLANT
TUBE ENDOTRACH EMG 6MMTUBE EN (MISCELLANEOUS) IMPLANT
UNDERPAD 30X36 HEAVY ABSORB (UNDERPADS AND DIAPERS) IMPLANT

## 2024-09-09 NOTE — Op Note (Signed)
 Preop diagnosis: Left throat pain, Eagle syndrome Postop diagnosis: same Procedure: Transoral left styloidectomy Surgeon: Carlie Anesth: General Compl: None Findings: Left tonsil absent.  End of styloid process palpable through tonsil fossa.  Dissection down to process allowed removal of distal 1 cm or so in two pieces. Description:  After discussing risks, benefits, and alternatives, the patient was brought to the operative suite and placed on the operative table in the supine position.  Anesthesia was induced and the patient was intubated by the anesthesia team without difficulty.  The bed was turned 90 degrees from anesthesia and the eyes were taped closed.  The patient was given IV Decadron .  A head wrap was placed around the patient's head and the oropharynx was exposed with a Crow-Davis retractor that was placed in suspension on the [employer] stand.   The left tonsil fossa was palpated and the styloid process identified.  The area was injected with local anesthetic.  A vertical incision was made at the anterior tonsil fossa using electrocautery.  Blunt dissection was then used to expose the tip of the styloid process that was further isolated from soft tissue more superiorly.  The distal end of the process was then removed in two pieces using a rongeur.  The pieces were passed to nursing for pathology.  At that point, the remaining process as barely palpable and deep.  The wound was left open to heal.  The throat was suctioned.  The Crow-Davis retractor was taken out of suspension and removed from the patient's mouth.  She was then turned back to anesthesia for wake-up and was extubated and moved to the recovery room in stable condition.

## 2024-09-09 NOTE — Anesthesia Procedure Notes (Signed)
 Procedure Name: Intubation Date/Time: 09/09/2024 10:56 AM  Performed by: Jerl Donald LABOR, CRNAPre-anesthesia Checklist: Patient identified, Emergency Drugs available, Suction available and Patient being monitored Patient Re-evaluated:Patient Re-evaluated prior to induction Oxygen Delivery Method: Circle System Utilized Preoxygenation: Pre-oxygenation with 100% oxygen Induction Type: IV induction Ventilation: Mask ventilation without difficulty Laryngoscope Size: Glidescope and 3 Grade View: Grade I Tube type: Oral Tube size: 6.5 mm Number of attempts: 1 Airway Equipment and Method: Stylet Placement Confirmation: ETT inserted through vocal cords under direct vision, positive ETCO2 and breath sounds checked- equal and bilateral Secured at: 21 cm Tube secured with: Tape Dental Injury: Teeth and Oropharynx as per pre-operative assessment  Difficulty Due To: Difficult Airway- due to anterior larynx and Difficult Airway- due to limited oral opening

## 2024-09-09 NOTE — Transfer of Care (Signed)
 Immediate Anesthesia Transfer of Care Note  Patient: Christina [name]  Procedure(s) Performed: EXCISION, STYLOID PROCESS, TEMPORAL BONE (Left)  Patient Location: PACU  Anesthesia Type:General  Level of Consciousness: awake, oriented, and drowsy  Airway & Oxygen Therapy: Patient Spontanous Breathing  Post-op Assessment: Report given to RN, Post -op Vital signs reviewed and stable, and Patient moving all extremities  Post vital signs: Reviewed and stable  Last Vitals:  Vitals Value Taken Time  BP 128/66 09/09/24 11:26  Temp    Pulse 75 09/09/24 11:27  Resp 12 09/09/24 11:27  SpO2 96 % 09/09/24 11:27  Vitals shown include unfiled device data.  Last Pain:  Vitals:   09/09/24 0810  TempSrc:   PainSc: 2          Complications: No notable events documented.
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
## 2024-09-10 NOTE — Anesthesia Postprocedure Evaluation (Signed)
 Anesthesia Post Note  Patient: Christina [name]  Procedure(s) Performed: EXCISION, STYLOID PROCESS, TEMPORAL BONE (Left)     Patient location during evaluation: PACU Anesthesia Type: General Level of consciousness: awake and alert Pain management: pain level controlled Vital Signs Assessment: post-procedure vital signs reviewed and stable Respiratory status: spontaneous breathing, nonlabored ventilation, respiratory function stable and patient connected to nasal cannula oxygen Cardiovascular status: blood pressure returned to baseline and stable Postop Assessment: no apparent nausea or vomiting Anesthetic complications: no   No notable events documented.  Last Vitals:  Vitals:   09/09/24 1145 09/09/24 1155  BP: 122/61 (!) 132/57  Pulse: 66 66  Resp: 12 19  Temp:  36.5 C  SpO2: 96% 96%    Last Pain:  Vitals:   09/09/24 1155  TempSrc:   PainSc: 0-No pain                 Myiesha Edgar L Michel Eskelson

## 2024-09-12 LAB — SURGICAL PATHOLOGY

## 2024-09-16 ENCOUNTER — Encounter: Payer: Self-pay | Admitting: Family Medicine

## 2024-09-16 ENCOUNTER — Other Ambulatory Visit: Payer: Self-pay | Admitting: Family Medicine

## 2024-09-16 MED ORDER — METHYLPREDNISOLONE 4 MG PO TBPK: ORAL_TABLET | ORAL | 0 refills | Status: AC

## 2024-09-22 ENCOUNTER — Other Ambulatory Visit: Payer: Self-pay | Admitting: *Deleted

## 2024-09-22 DIAGNOSIS — G43009 Migraine without aura, not intractable, without status migrainosus: Secondary | ICD-10-CM

## 2024-09-22 MED ORDER — NURTEC 75 MG PO TBDP: 75.0000 mg | ORAL_TABLET | Freq: Every day | ORAL | 1 refills | Status: DC | PRN

## 2024-09-22 NOTE — Telephone Encounter (Signed)
 Last seen on 08/15/24 Follow up scheduled on 11/14/24

## 2024-09-26 ENCOUNTER — Other Ambulatory Visit: Payer: Self-pay

## 2024-09-26 DIAGNOSIS — G43009 Migraine without aura, not intractable, without status migrainosus: Secondary | ICD-10-CM

## 2024-09-26 MED ORDER — NARATRIPTAN HCL 2.5 MG PO TABS: 2.5000 mg | ORAL_TABLET | ORAL | 2 refills | Status: DC | PRN

## 2024-10-18 NOTE — Telephone Encounter (Signed)
 Submitted auth renewal, status is pending. Key: [id number]

## 2024-10-26 ENCOUNTER — Encounter: Payer: Self-pay | Admitting: Family Medicine

## 2024-10-26 NOTE — Telephone Encounter (Signed)
 Spoke with Christina Lomax,NP. She approved having pt come in for migraine infusion. Per Kim[facility], she can come in tomorrow at 830, 9 or 11am.   I called pt. She will come tomorrow at 830am. She will be driving to and from. Gave signed order below to [facility].

## 2024-10-27 NOTE — Telephone Encounter (Signed)
 Received approval, she will continue to fill through [facility].  Auth#: [id number] (10/18/24-11/23/25)

## 2024-11-10 ENCOUNTER — Other Ambulatory Visit: Payer: Self-pay

## 2024-11-14 ENCOUNTER — Ambulatory Visit: Admitting: Family Medicine

## 2024-11-14 ENCOUNTER — Encounter: Payer: Self-pay | Admitting: Family Medicine

## 2024-11-14 VITALS — BP 115/69 | HR 69

## 2024-11-14 DIAGNOSIS — G43009 Migraine without aura, not intractable, without status migrainosus: Secondary | ICD-10-CM

## 2024-11-14 DIAGNOSIS — G43709 Chronic migraine without aura, not intractable, without status migrainosus: Secondary | ICD-10-CM | POA: Diagnosis not present

## 2024-11-14 MED ADMIN — OnabotulinumtoxinA For Inj 200 Unit: 155 [IU] | INTRAMUSCULAR | NDC 00023392102

## 2024-11-14 NOTE — Progress Notes (Signed)
 Botox - 200 units x 1 vial Lot: [id number] Expiration: 04/2026 NDC: 9976-6078-97  Bacteriostatic 0.9% Sodium Chloride - 4 mL  Lot: [id number] Expiration: MAR-31-2027 NDC: 9590-8033-97  Dx: G43.009 S/P Witnessed by: Nevelyn Meissner, CMA

## 2024-11-14 NOTE — Progress Notes (Signed)
 "   11/14/2024 ALL: Christina [name] returns for Botox . She continues atenolol  100mg  and Qulipta  60mg  daily and naratriptan  or Ubrelvy  as needed. Headaches have been more frequent over the past few months. Daily headaches with 8-12 migraine days.   08/15/2024 ALL: Christina [name] returns for Botox . She continues atenolol  100mg  and Qulipta  60mg  daily and naratriptan  or Ubrelvy  as needed. She has Nurtec as well but not as effective. Last migraine 05/2024.   05/18/2024 ALL: Christina [name] returns for Botox . She continues atenolol  100mg  and  Qulipta  60mg  daily and naratriptan  or Nurtec as needed. She is doing great. Last migraine 02/2024. Milder headaches easily aborted with Tylenol . Lost sister recently.   02/15/2024 ALL: Christina [name] returns for Botox . She reports migraines have been well managed since last visit. She had 2 migraines in January and 1 in Feb. None in March. She added magnesium to Co Q10 and feels this has helped. She stocked up on naratriptan  and Nurtec but hasn't needed them recently.   11/23/2023 ALL: Christina [name] returns for Botox . She was given steroid taper two weeks ago for intractable migraine but otherwise doing well. She continues atenolol  100mg  and Qulipta  60mg  daily. Averaging 1 headache per month until last two weeks prior to next Botox  then nearly daily headaches. Nurtec and naratriptan  usually help.   08/26/2023 ALL: Christina [name] returns for Botox . She is doing well. She continues ateolol and Qulipta . She averages about 5-10 headache days over the past 3 months with 2-3 migraine days a month. She usually takes Nurtec and naratriptan  with Tylenol . Using about 4-5 tablets of these per month. Occasionally uses phenergan  for nausea. May take 2-3 tablets a month at most.   06/02/2023 ALL: Christina [name] returns for Botox . She continues to do well. She continues Qulipta  60mg  and atenolol  100mg  daily. Nurtec and naratriptan  alternated for abortive therapy. She is doing well. She averages 2-3 headache days a month with 1-2 migrainous  headaches.   03/03/2023 ALL: Christina [name] returns for Botox . She continues Qulipta  60mg  and atenolol  100mg  daily. Nurtec and naratriptan  alternated for abortive therapy. She is doing well. She averages 2-3 headache days a month with 1-2 migrainous headaches.   12/09/2022 ALL: Christina [name] returns for Botox . She continues Qulipta  60mg  daily and atenolol  100mg  daily. Nurtec and naratriptan  used for abortive therapy. She reports headaches were worse for about two weeks after last visit but since have been well managed. She has not had breakthrough headaches like normal.   09/15/2022 ALL: Christina [name] returns for Botox . She continues Qulipta  and atenolol  (recently increased to 100mg ). Nurtec and naratripan alternated for abortive therapy. She reports headaches are fairly stable. Rare migraines for first 8-9 weeks. She does have daily migraines for the last two weeks before next procedure. She uses abortive meds QOD for the last two weeks. Prednisone  taper helps as well. Last taper 09/03/2022.   06/17/2022 ALL: Christina [name] returns for Botox . We increased atenolol  to 75mg  in 04/2022 after headaches worsened. She continues Qulipta  daily. Nurtec and naratritpan works best for abortive therapy. Trudesha not covered.   03/20/2022 ALL: Christina [name] returns for Botox . We increased atenolol  to 50mg  daily and continued Qulipta . She reports daily headaches are significantly better. She may have 1 headache per week. She may have 2 migraines on average per month. She uses either Nurtec, ubrelvy  or naratriptan  for abortive therapy.   12/19/2021 ALL: She returns for Botox . She was seen in follow up with Dr Ines via Mychart 12/10/2021 and started on atenolol  12.5mg  daily. She has failed multiple oral and injection preventatives. She continues  Qulipta . She feels that she is doing a little better. She did have a migraine yesterday thought to be related to the weather. Dr Ines gave her Ubrelvy  16 tabs per month. She also has Nurtec and naratriptan  which  help with abortive therapy. She is aware not to take more than prescribed dose and to try to avoid regular use of abortive agents.   09/16/2021 ALL: She returns for Botox . She is having more frequent and intense migraines. Tummy tuck over the summer then DVTs. Hemoglobin dropped and had GI eval. More stress. About 4-5 migraines a month but some toward end of Emgality /Botox  cycle are severe and intractable. Medrol  dose pack did not help much last week. Nurtec and naratriptan  do help some. Tried and failed topiramate , nortriptyline, propranolol, metoprolol, Amovig, Emgality , Ajovy , gabapentin , sumatriptan , rizatriptan , Botox . We will try Qulipta  60mg  daily.  06/12/2021 ALL: She returns for Botox  procedure. She had difficulty with an intractable migraines that lasted for over a week. It was finally aborted with migraine cocktail and steroid taper. She usually has about 1 migraine per month that is easily aborted with Nurtec. She is feeling well, today.   02/27/2021 ALL: She continues to do well on Botox . Greater than 50% reduction in migraines. Emgality  and rizatriptan  working well.   11/29/2020 ALL: She continues to do well. She continues Botox  and Emgality . Rizatriptan  works well for abortive therapy. Average 1 migraine per month.    Consent Form Botulism Toxin Injection For Chronic Migraine    Reviewed orally with patient, additionally signature is on file:  Botulism toxin has been approved by the Federal drug administration for treatment of chronic migraine. Botulism toxin does not cure chronic migraine and it may not be effective in some patients.  The administration of botulism toxin is accomplished by injecting a small amount of toxin into the muscles of the neck and head. Dosage must be titrated for each individual. Any benefits resulting from botulism toxin tend to wear off after 3 months with a repeat injection required if benefit is to be maintained. Injections are usually done every 3-4  months with maximum effect peak achieved by about 2 or 3 weeks. Botulism toxin is expensive and you should be sure of what costs you will incur resulting from the injection.  The side effects of botulism toxin use for chronic migraine may include:   -Transient, and usually mild, facial weakness with facial injections  -Transient, and usually mild, head or neck weakness with head/neck injections  -Reduction or loss of forehead facial animation due to forehead muscle weakness  -Eyelid drooping  -Dry eye  -Pain at the site of injection or bruising at the site of injection  -Double vision  -Potential unknown long term risks   Contraindications: You should not have Botox  if you are pregnant, nursing, allergic to albumin, have an infection, skin condition, or muscle weakness at the site of the injection, or have myasthenia gravis, Lambert-Eaton syndrome, or ALS.  It is also possible that as with any injection, there may be an allergic reaction or no effect from the medication. Reduced effectiveness after repeated injections is sometimes seen and rarely infection at the injection site may occur. All care will be taken to prevent these side effects. If therapy is given over a long time, atrophy and wasting in the muscle injected may occur. Occasionally the patient's become refractory to treatment because they develop antibodies to the toxin. In this event, therapy needs to be modified.  I have read the above  information and consent to the administration of botulism toxin.    BOTOX  PROCEDURE NOTE FOR MIGRAINE HEADACHE  Contraindications and precautions discussed with patient(above). Aseptic procedure was observed and patient tolerated procedure. Procedure performed by Greig Forbes, FNP-C.   The condition has existed for more than 6 months, and pt does not have a diagnosis of ALS, Myasthenia Gravis or Lambert-Eaton Syndrome.  Risks and benefits of injections discussed and pt agrees to proceed with the  procedure.  Written consent obtained  These injections are medically necessary. Pt  receives good benefits from these injections. These injections do not cause sedations or hallucinations which the oral therapies may cause.   Description of procedure:  The patient was placed in a sitting position. The standard protocol was used for Botox  as follows, with 5 units of Botox  injected at each site:  -Procerus muscle, midline injection  -Corrugator muscle, bilateral injection  -Frontalis muscle, bilateral injection, with 2 sites each side, medial injection was performed in the upper one third of the frontalis muscle, in the region vertical from the medial inferior edge of the superior orbital rim. The lateral injection was again in the upper one third of the forehead vertically above the lateral limbus of the cornea, 1.5 cm lateral to the medial injection site.  -Temporalis muscle injection, 4 sites, bilaterally. The first injection was 3 cm above the tragus of the ear, second injection site was 1.5 cm to 3 cm up from the first injection site in line with the tragus of the ear. The third injection site was 1.5-3 cm forward between the first 2 injection sites. The fourth injection site was 1.5 cm posterior to the second injection site. 5th site laterally in the temporalis  muscleat the level of the outer canthus.  -Occipitalis muscle injection, 5 sites, bilaterally. The first injection was done one half way between the occipital protuberance and the tip of the mastoid process behind the ear. The second injection site was done lateral and superior to the first, 1 fingerbreadth from the first injection. The third injection site was 1 fingerbreadth superiorly and medially from the first injection site.  -Cervical paraspinal muscle injection, 2 sites, bilaterally. The first injection site was 1 cm from the midline of the cervical spine, 3 cm inferior to the lower border of the occipital protuberance. The  second injection site was 1.5 cm superiorly and laterally to the first injection site.  -Trapezius muscle injection was performed at 3 sites, bilaterally. The first injection site was in the upper trapezius muscle halfway between the inflection point of the neck, and the acromion. The second injection site was one half way between the acromion and the first injection site. The third injection was done between the first injection site and the inflection point of the neck.   Will return for repeat injection in 3 months.   A total of 200 units of Botox  was prepared, 155 units of Botox  was injected as documented above, 45 units of Botox  was wasted. The patient tolerated the procedure well, there were no complications of the above procedure. "

## 2024-11-22 ENCOUNTER — Other Ambulatory Visit: Payer: Self-pay | Admitting: *Deleted

## 2024-11-22 MED ORDER — PROMETHAZINE HCL 25 MG PO TABS: ORAL_TABLET | ORAL | 1 refills | Status: AC

## 2024-11-22 NOTE — Telephone Encounter (Signed)
 Last seen on 11/14/24 for botox   Last office visit on 05/18/24   Dispensed Days Supply Quantity Provider Pharmacy  PROMETHAZINE  25 MG TABLET 08/20/2024 6 20 each Lomax, Amy, NP [id number] [place]      Amy did you want to continuing filling this?  Rx pending

## 2024-12-05 ENCOUNTER — Telehealth: Payer: Self-pay | Admitting: Pharmacist

## 2024-12-05 ENCOUNTER — Other Ambulatory Visit: Payer: Self-pay | Admitting: *Deleted

## 2024-12-05 DIAGNOSIS — G43009 Migraine without aura, not intractable, without status migrainosus: Secondary | ICD-10-CM

## 2024-12-05 MED ORDER — QULIPTA 60 MG PO TABS: 60.0000 mg | ORAL_TABLET | Freq: Every day | ORAL | 2 refills | Status: AC

## 2024-12-05 MED ORDER — NURTEC 75 MG PO TBDP: 75.0000 mg | ORAL_TABLET | Freq: Every day | ORAL | 1 refills | Status: AC | PRN

## 2024-12-05 NOTE — Telephone Encounter (Signed)
 Last seen on 11/14/24 Follow up scheduled on 02/14/25

## 2024-12-05 NOTE — Telephone Encounter (Signed)
 Pharmacy Patient Advocate Encounter   Received notification from CoverMyMeds that prior authorization for Ubrelvy  100MG  tablets is due for renewal.   Insurance verification completed.   The patient is insured through [facility].  This medication or product was previously approved on [id number] from 2024-11-24 to 2025-11-23.

## 2024-12-08 ENCOUNTER — Encounter: Payer: Self-pay | Admitting: Radiology

## 2024-12-08 ENCOUNTER — Other Ambulatory Visit: Payer: Self-pay | Admitting: Interventional Radiology

## 2024-12-08 ENCOUNTER — Other Ambulatory Visit: Payer: Self-pay | Admitting: *Deleted

## 2024-12-08 DIAGNOSIS — I871 Compression of vein: Secondary | ICD-10-CM

## 2024-12-08 DIAGNOSIS — Z86718 Personal history of other venous thrombosis and embolism: Secondary | ICD-10-CM

## 2024-12-08 DIAGNOSIS — G43009 Migraine without aura, not intractable, without status migrainosus: Secondary | ICD-10-CM

## 2024-12-08 MED ORDER — NARATRIPTAN HCL 2.5 MG PO TABS: 2.5000 mg | ORAL_TABLET | ORAL | 2 refills | Status: DC | PRN

## 2024-12-08 NOTE — Telephone Encounter (Signed)
 Last seen on 11/14/24 Follow up scheduled on 02/14/25

## 2024-12-09 ENCOUNTER — Ambulatory Visit
Admission: RE | Admit: 2024-12-09 | Discharge: 2024-12-09 | Disposition: A | Discharge status: Home / Self Care | Source: Ambulatory Visit | Attending: Interventional Radiology | Admitting: Interventional Radiology

## 2024-12-09 DIAGNOSIS — Z86718 Personal history of other venous thrombosis and embolism: Secondary | ICD-10-CM

## 2024-12-09 DIAGNOSIS — I871 Compression of vein: Secondary | ICD-10-CM

## 2024-12-09 MED ORDER — IOPAMIDOL (ISOVUE-370) INJECTION 76%
125.0000 mL | Freq: Once | INTRAVENOUS | Status: AC | PRN
  Administered 2024-12-09: 125 mL via INTRAVENOUS
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
## 2024-12-13 ENCOUNTER — Other Ambulatory Visit: Payer: Self-pay

## 2024-12-13 ENCOUNTER — Encounter: Payer: Self-pay | Admitting: Family Medicine

## 2024-12-13 DIAGNOSIS — G43009 Migraine without aura, not intractable, without status migrainosus: Secondary | ICD-10-CM

## 2024-12-13 MED ORDER — NARATRIPTAN HCL 2.5 MG PO TABS: 2.5000 mg | ORAL_TABLET | ORAL | 1 refills | Status: AC | PRN

## 2024-12-14 ENCOUNTER — Telehealth (HOSPITAL_COMMUNITY): Payer: Self-pay | Admitting: Student

## 2024-12-14 ENCOUNTER — Inpatient Hospital Stay
Admission: RE | Admit: 2024-12-14 | Discharge: 2024-12-14 | Disposition: A | Source: Ambulatory Visit | Attending: Interventional Radiology

## 2024-12-14 DIAGNOSIS — Z86718 Personal history of other venous thrombosis and embolism: Secondary | ICD-10-CM

## 2024-12-14 DIAGNOSIS — I871 Compression of vein: Secondary | ICD-10-CM

## 2024-12-14 HISTORY — PX: IR RADIOLOGIST EVAL & MGMT: IMG5224

## 2024-12-14 MED ORDER — APIXABAN 5 MG PO TABS: 5.0000 mg | ORAL_TABLET | Freq: Two times a day (BID) | ORAL | 3 refills | Status: AC

## 2024-12-14 NOTE — Telephone Encounter (Signed)
 Refill of Eliquis  5 mg BID e-prescribed to [facility]. 90 day supply with 3 refills. Per Dr. Jennefer the patient will remain on this drug indefinitely. Patient aware to call me back if there are any difficulties with this prescription.  Karema Tocci, AGACNP-BC 12/14/2024, 8:48 AM

## 2024-12-29 ENCOUNTER — Other Ambulatory Visit: Payer: Self-pay

## 2024-12-29 ENCOUNTER — Ambulatory Visit: Admitting: Physical Therapy

## 2024-12-29 DIAGNOSIS — R293 Abnormal posture: Secondary | ICD-10-CM

## 2024-12-29 DIAGNOSIS — R279 Unspecified lack of coordination: Secondary | ICD-10-CM

## 2024-12-29 DIAGNOSIS — N393 Stress incontinence (female) (male): Secondary | ICD-10-CM

## 2024-12-29 DIAGNOSIS — M6281 Muscle weakness (generalized): Secondary | ICD-10-CM
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
## 2025-01-02 ENCOUNTER — Ambulatory Visit: Payer: Self-pay | Admitting: Physical Therapy

## 2025-01-30 ENCOUNTER — Ambulatory Visit: Admitting: Physical Therapy

## 2025-02-06 ENCOUNTER — Ambulatory Visit: Admitting: Physical Therapy

## 2025-02-13 ENCOUNTER — Ambulatory Visit: Admitting: Physical Therapy

## 2025-02-13 ENCOUNTER — Ambulatory Visit (HOSPITAL_COMMUNITY): Admitting: Physician Assistant

## 2025-02-14 ENCOUNTER — Ambulatory Visit: Admitting: Family Medicine

## 2025-02-20 ENCOUNTER — Ambulatory Visit: Admitting: Physical Therapy
